# Patient Record
Sex: Female | Born: 1955 | Race: White | Hispanic: No | Marital: Married | Smoking: Former smoker
Health system: Southern US, Community
[De-identification: ages and names within clinical notes are randomized; demographics above are authoritative.]

## PROBLEM LIST (undated history)

## (undated) DIAGNOSIS — F329 Major depressive disorder, single episode, unspecified: Secondary | ICD-10-CM

## (undated) DIAGNOSIS — R011 Cardiac murmur, unspecified: Secondary | ICD-10-CM

## (undated) DIAGNOSIS — E559 Vitamin D deficiency, unspecified: Secondary | ICD-10-CM

## (undated) DIAGNOSIS — M2550 Pain in unspecified joint: Secondary | ICD-10-CM

## (undated) DIAGNOSIS — E079 Disorder of thyroid, unspecified: Secondary | ICD-10-CM

## (undated) DIAGNOSIS — K760 Fatty (change of) liver, not elsewhere classified: Secondary | ICD-10-CM

## (undated) DIAGNOSIS — Z780 Asymptomatic menopausal state: Secondary | ICD-10-CM

## (undated) DIAGNOSIS — K5900 Constipation, unspecified: Secondary | ICD-10-CM

## (undated) DIAGNOSIS — R12 Heartburn: Secondary | ICD-10-CM

## (undated) DIAGNOSIS — R7303 Prediabetes: Secondary | ICD-10-CM

## (undated) DIAGNOSIS — H409 Unspecified glaucoma: Secondary | ICD-10-CM

## (undated) DIAGNOSIS — F32A Depression, unspecified: Secondary | ICD-10-CM

## (undated) HISTORY — DX: Asymptomatic menopausal state: Z78.0

## (undated) HISTORY — DX: Unspecified glaucoma: H40.9

## (undated) HISTORY — DX: Cardiac murmur, unspecified: R01.1

## (undated) HISTORY — PX: TUBAL LIGATION: SHX77

## (undated) HISTORY — PX: OTHER SURGICAL HISTORY: SHX169

## (undated) HISTORY — DX: Major depressive disorder, single episode, unspecified: F32.9

## (undated) HISTORY — DX: Heartburn: R12

## (undated) HISTORY — DX: Disorder of thyroid, unspecified: E07.9

## (undated) HISTORY — DX: Pain in unspecified joint: M25.50

## (undated) HISTORY — DX: Depression, unspecified: F32.A

## (undated) HISTORY — DX: Vitamin D deficiency, unspecified: E55.9

## (undated) HISTORY — DX: Prediabetes: R73.03

## (undated) HISTORY — PX: ORIF ELBOW FRACTURE: SUR928

## (undated) HISTORY — PX: TONSILLECTOMY: SUR1361

## (undated) HISTORY — DX: Constipation, unspecified: K59.00

## (undated) HISTORY — PX: TOE SURGERY: SHX1073

## (undated) HISTORY — DX: Fatty (change of) liver, not elsewhere classified: K76.0

---

## 1998-07-04 ENCOUNTER — Other Ambulatory Visit: Admission: RE | Admit: 1998-07-04 | Discharge: 1998-07-04 | Payer: Self-pay | Admitting: Obstetrics and Gynecology

## 1999-06-08 ENCOUNTER — Ambulatory Visit (HOSPITAL_COMMUNITY): Admission: RE | Admit: 1999-06-08 | Discharge: 1999-06-08 | Payer: Self-pay | Admitting: Family Medicine

## 1999-06-08 ENCOUNTER — Encounter: Payer: Self-pay | Admitting: Family Medicine

## 1999-07-04 ENCOUNTER — Encounter: Payer: Self-pay | Admitting: Family Medicine

## 1999-07-04 ENCOUNTER — Ambulatory Visit (HOSPITAL_COMMUNITY): Admission: RE | Admit: 1999-07-04 | Discharge: 1999-07-04 | Payer: Self-pay | Admitting: Family Medicine

## 1999-12-25 ENCOUNTER — Other Ambulatory Visit: Admission: RE | Admit: 1999-12-25 | Discharge: 1999-12-25 | Payer: Self-pay | Admitting: Obstetrics and Gynecology

## 2001-01-30 ENCOUNTER — Other Ambulatory Visit: Admission: RE | Admit: 2001-01-30 | Discharge: 2001-01-30 | Payer: Self-pay | Admitting: Obstetrics and Gynecology

## 2001-04-13 ENCOUNTER — Encounter: Payer: Self-pay | Admitting: Endocrinology

## 2001-04-13 ENCOUNTER — Encounter: Admission: RE | Admit: 2001-04-13 | Discharge: 2001-04-13 | Payer: Self-pay | Admitting: Endocrinology

## 2002-03-23 ENCOUNTER — Other Ambulatory Visit: Admission: RE | Admit: 2002-03-23 | Discharge: 2002-03-23 | Payer: Self-pay | Admitting: Obstetrics and Gynecology

## 2002-04-02 ENCOUNTER — Encounter: Payer: Self-pay | Admitting: Endocrinology

## 2002-04-02 ENCOUNTER — Encounter: Admission: RE | Admit: 2002-04-02 | Discharge: 2002-04-02 | Payer: Self-pay | Admitting: Endocrinology

## 2003-04-21 ENCOUNTER — Other Ambulatory Visit: Admission: RE | Admit: 2003-04-21 | Discharge: 2003-04-21 | Payer: Self-pay | Admitting: Obstetrics and Gynecology

## 2004-07-19 ENCOUNTER — Other Ambulatory Visit: Admission: RE | Admit: 2004-07-19 | Discharge: 2004-07-19 | Payer: Self-pay | Admitting: Obstetrics and Gynecology

## 2004-08-01 ENCOUNTER — Encounter: Admission: RE | Admit: 2004-08-01 | Discharge: 2004-08-01 | Payer: Self-pay | Admitting: Obstetrics and Gynecology

## 2005-08-28 ENCOUNTER — Other Ambulatory Visit: Admission: RE | Admit: 2005-08-28 | Discharge: 2005-08-28 | Payer: Self-pay | Admitting: Obstetrics and Gynecology

## 2005-09-19 ENCOUNTER — Encounter: Admission: RE | Admit: 2005-09-19 | Discharge: 2005-09-19 | Payer: Self-pay | Admitting: Obstetrics and Gynecology

## 2005-11-05 ENCOUNTER — Emergency Department (HOSPITAL_COMMUNITY): Admission: EM | Admit: 2005-11-05 | Discharge: 2005-11-05 | Payer: Self-pay | Admitting: Emergency Medicine

## 2005-11-08 ENCOUNTER — Encounter (HOSPITAL_COMMUNITY): Admission: RE | Admit: 2005-11-08 | Discharge: 2006-01-23 | Payer: Self-pay | Admitting: Emergency Medicine

## 2007-02-26 ENCOUNTER — Encounter: Admission: RE | Admit: 2007-02-26 | Discharge: 2007-02-26 | Payer: Self-pay | Admitting: Endocrinology

## 2007-11-27 ENCOUNTER — Encounter: Admission: RE | Admit: 2007-11-27 | Discharge: 2007-11-27 | Payer: Self-pay | Admitting: Obstetrics and Gynecology

## 2008-02-26 ENCOUNTER — Encounter: Admission: RE | Admit: 2008-02-26 | Discharge: 2008-02-26 | Payer: Self-pay | Admitting: Endocrinology

## 2009-01-02 ENCOUNTER — Encounter: Admission: RE | Admit: 2009-01-02 | Discharge: 2009-01-02 | Payer: Self-pay | Admitting: Obstetrics and Gynecology

## 2009-02-28 ENCOUNTER — Other Ambulatory Visit: Admission: RE | Admit: 2009-02-28 | Discharge: 2009-02-28 | Payer: Self-pay | Admitting: Family Medicine

## 2009-10-20 ENCOUNTER — Ambulatory Visit: Payer: Self-pay | Admitting: Family Medicine

## 2009-10-20 DIAGNOSIS — E042 Nontoxic multinodular goiter: Secondary | ICD-10-CM | POA: Insufficient documentation

## 2009-10-20 DIAGNOSIS — R928 Other abnormal and inconclusive findings on diagnostic imaging of breast: Secondary | ICD-10-CM | POA: Insufficient documentation

## 2009-10-20 DIAGNOSIS — E038 Other specified hypothyroidism: Secondary | ICD-10-CM

## 2009-10-20 DIAGNOSIS — E039 Hypothyroidism, unspecified: Secondary | ICD-10-CM

## 2009-10-20 DIAGNOSIS — K3189 Other diseases of stomach and duodenum: Secondary | ICD-10-CM | POA: Insufficient documentation

## 2009-10-20 DIAGNOSIS — R1013 Epigastric pain: Secondary | ICD-10-CM

## 2009-10-20 DIAGNOSIS — N943 Premenstrual tension syndrome: Secondary | ICD-10-CM | POA: Insufficient documentation

## 2009-10-20 HISTORY — DX: Other specified hypothyroidism: E03.8

## 2009-10-20 HISTORY — DX: Nontoxic multinodular goiter: E04.2

## 2009-10-24 ENCOUNTER — Encounter (INDEPENDENT_AMBULATORY_CARE_PROVIDER_SITE_OTHER): Payer: Self-pay | Admitting: *Deleted

## 2009-11-23 ENCOUNTER — Encounter (INDEPENDENT_AMBULATORY_CARE_PROVIDER_SITE_OTHER): Payer: Self-pay | Admitting: *Deleted

## 2009-11-27 ENCOUNTER — Ambulatory Visit: Payer: Self-pay | Admitting: Internal Medicine

## 2009-11-30 ENCOUNTER — Telehealth (INDEPENDENT_AMBULATORY_CARE_PROVIDER_SITE_OTHER): Payer: Self-pay | Admitting: *Deleted

## 2010-01-03 ENCOUNTER — Encounter: Admission: RE | Admit: 2010-01-03 | Discharge: 2010-01-03 | Payer: Self-pay | Admitting: Family Medicine

## 2010-01-04 ENCOUNTER — Encounter (INDEPENDENT_AMBULATORY_CARE_PROVIDER_SITE_OTHER): Payer: Self-pay | Admitting: *Deleted

## 2010-03-02 ENCOUNTER — Encounter (INDEPENDENT_AMBULATORY_CARE_PROVIDER_SITE_OTHER): Payer: Self-pay | Admitting: *Deleted

## 2010-03-02 ENCOUNTER — Other Ambulatory Visit: Admission: RE | Admit: 2010-03-02 | Discharge: 2010-03-02 | Payer: Self-pay | Admitting: Family Medicine

## 2010-03-02 ENCOUNTER — Encounter: Payer: Self-pay | Admitting: Family Medicine

## 2010-03-02 ENCOUNTER — Ambulatory Visit: Payer: Self-pay | Admitting: Family Medicine

## 2010-03-02 DIAGNOSIS — Z780 Asymptomatic menopausal state: Secondary | ICD-10-CM | POA: Insufficient documentation

## 2010-03-02 DIAGNOSIS — N951 Menopausal and female climacteric states: Secondary | ICD-10-CM | POA: Insufficient documentation

## 2010-03-02 HISTORY — DX: Menopausal and female climacteric states: N95.1

## 2010-03-07 ENCOUNTER — Encounter: Admission: RE | Admit: 2010-03-07 | Discharge: 2010-03-07 | Payer: Self-pay | Admitting: Family Medicine

## 2010-03-08 ENCOUNTER — Telehealth (INDEPENDENT_AMBULATORY_CARE_PROVIDER_SITE_OTHER): Payer: Self-pay | Admitting: *Deleted

## 2010-03-27 ENCOUNTER — Encounter (INDEPENDENT_AMBULATORY_CARE_PROVIDER_SITE_OTHER): Payer: Self-pay | Admitting: *Deleted

## 2010-04-10 ENCOUNTER — Telehealth (INDEPENDENT_AMBULATORY_CARE_PROVIDER_SITE_OTHER): Payer: Self-pay | Admitting: *Deleted

## 2010-04-25 ENCOUNTER — Encounter (INDEPENDENT_AMBULATORY_CARE_PROVIDER_SITE_OTHER): Payer: Self-pay | Admitting: *Deleted

## 2010-04-27 ENCOUNTER — Ambulatory Visit: Payer: Self-pay | Admitting: Gastroenterology

## 2010-04-27 ENCOUNTER — Telehealth: Payer: Self-pay | Admitting: Gastroenterology

## 2010-05-30 ENCOUNTER — Ambulatory Visit
Admission: RE | Admit: 2010-05-30 | Discharge: 2010-05-30 | Payer: Self-pay | Source: Home / Self Care | Attending: Gastroenterology | Admitting: Gastroenterology

## 2010-05-30 ENCOUNTER — Encounter: Payer: Self-pay | Admitting: Gastroenterology

## 2010-05-30 HISTORY — PX: COLONOSCOPY: SHX174

## 2010-05-30 LAB — HM COLONOSCOPY

## 2010-06-10 LAB — CONVERTED CEMR LAB
ALT: 19 units/L (ref 0–35)
AST: 19 units/L (ref 0–37)
Basophils Absolute: 0 10*3/uL (ref 0.0–0.1)
Basophils Relative: 0.7 % (ref 0.0–3.0)
Calcium: 9.3 mg/dL (ref 8.4–10.5)
Cholesterol: 225 mg/dL — ABNORMAL HIGH (ref 0–200)
Creatinine, Ser: 0.8 mg/dL (ref 0.4–1.2)
Eosinophils Absolute: 0.1 10*3/uL (ref 0.0–0.7)
Eosinophils Relative: 2.5 % (ref 0.0–5.0)
GFR calc non Af Amer: 78.45 mL/min (ref >60)
HCT: 41.9 % (ref 36.0–46.0)
HDL: 60.6 mg/dL (ref >39.00)
Hemoglobin: 14.6 g/dL (ref 12.0–15.0)
LH: 18.76 milliintl units/mL
Lymphs Abs: 1.7 10*3/uL (ref 0.7–4.0)
MCHC: 34.8 g/dL (ref 30.0–36.0)
MCV: 91.3 fL (ref 78.0–100.0)
Monocytes Absolute: 0.4 10*3/uL (ref 0.1–1.0)
Neutrophils Relative %: 49.9 % (ref 43.0–77.0)
RBC: 4.59 M/uL (ref 3.87–5.11)
TSH: 1.63 microintl units/mL (ref 0.35–5.50)
Total CHOL/HDL Ratio: 4
Total Protein: 6.7 g/dL (ref 6.0–8.3)
VLDL: 13.8 mg/dL (ref 0.0–40.0)

## 2010-06-12 NOTE — Assessment & Plan Note (Signed)
Summary: cpx/pap/lab/cbs   Vital Signs:  Patient profile:   55 year old female Height:      70.5 inches Weight:      195.4 pounds Temp:     98.7 degrees F oral Pulse rate:   60 / minute Pulse rhythm:   regular BP sitting:   126 / 70  (left arm) Cuff size:   regular  Vitals Entered By: Almeta Monas CMA Duncan Dull) (March 02, 2010 9:20 AM) CC: cpx/pap   History of Present Illness: Pt here for cpe, pap and labs.  No complaints.   Pt will get a flu shot at work.     Preventive Screening-Counseling & Management  Alcohol-Tobacco     Alcohol drinks/day: <1     Alcohol type: all     Smoking Status: quit > 6 months     Packs/Day: 1.0     Year Started: 1976     Year Quit: 1995  Caffeine-Diet-Exercise     Caffeine use/day: 2     Does Patient Exercise: yes     Type of exercise: total gym     Exercise (avg: min/session): 30-60     Times/week: 2     Exercise Counseling: to improve exercise regimen  Hep-HIV-STD-Contraception     Dental Visit-last 6 months yes     Dental Care Counseling: to seek dental care; no dental care within six months     SBE monthly: yes     SBE Education/Counseling: not indicated; SBE done regularly      Sexual History:  currently monogamous.    Current Medications (verified): 1)  Synthroid 112 Mcg Tabs (Levothyroxine Sodium) .[name].. 1 By Mouth Once Daily 2)  Caltrate 600 1500 Mg Tabs (Calcium Carbonate) 3)  Black Cohosh Root 540 Mg Caps (Black Cohosh) .... By Mouth Once Daily 4)  Sarafem 20 Mg Tabs (Fluoxetine Hcl (Pmdd)) .[name].. 1 By Mouth Once Daily  Allergies (verified): 1)  ! Morphine 2)  ! Versed  Past History:  Past Medical History: Last updated: 10/20/2009 Hypothyroidism Breast calcifications --right breast  Past Surgical History: Last updated: 10/20/2009 ORAF left elbow Tubal ligation Tonsillectomy  Family History: Last updated: 10/20/2009 Family History of Arthritis Family History Hypertension Parkinsons Disease Family History of  Stroke M 1st degree relative 11 yo-- brother  Social History: Last updated: 10/20/2009 Married Never Smoked Alcohol use-yes Drug use-no Regular exercise-no Occupation: [employer]--[employer]  Risk Factors: Alcohol Use: <1 (03/02/2010) Caffeine Use: 2 (03/02/2010) Exercise: yes (03/02/2010)  Risk Factors: Smoking Status: quit > 6 months (03/02/2010) Packs/Day: 1.0 (03/02/2010)  Family History: Reviewed history from 10/20/2009 and no changes required. Family History of Arthritis Family History Hypertension Parkinsons Disease Family History of Stroke M 1st degree relative 38 yo-- brother  Social History: Reviewed history from 10/20/2009 and no changes required. 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 Eyes:  Denies blurring, discharge, double vision, eye irritation, eye pain, halos, itching, light sensitivity, red eye, vision loss-1 eye, and vision loss-both eyes; [id number]. ENT:  Denies decreased hearing, difficulty swallowing, ear discharge, earache, hoarseness, nasal congestion, nosebleeds, postnasal drainage, ringing in ears, sinus pressure, and sore throat.  CV:  Denies bluish discoloration of lips or nails, chest pain or discomfort, difficulty breathing at night, difficulty breathing while lying down, fainting, fatigue, leg cramps with exertion, lightheadness, near fainting, palpitations, shortness of breath with exertion, swelling of feet, swelling of hands, and weight gain. Resp:  Denies chest discomfort, chest pain with inspiration, cough, coughing up blood, excessive snoring, hypersomnolence, morning  headaches, pleuritic, shortness of breath, sputum productive, and wheezing. GI:  Denies abdominal pain, bloody stools, change in bowel habits, constipation, dark tarry stools, diarrhea, excessive appetite, gas, hemorrhoids, indigestion, and loss of appetite. GU:  Denies abnormal vaginal bleeding, decreased libido, discharge, dysuria, genital sores, hematuria, incontinence, nocturia, urinary frequency, and urinary hesitancy. MS:  Denies joint pain, joint redness, joint swelling, loss of strength, low back pain, mid back pain, muscle aches, muscle , cramps, muscle weakness, stiffness, and thoracic pain. Derm:  Denies changes in color of skin, changes in nail beds, dryness, excessive perspiration, flushing, hair loss, insect bite(s), itching, lesion(s), poor wound healing, and rash. Neuro:  Denies brief paralysis, difficulty with concentration, disturbances in coordination, falling down, headaches, inability to speak, memory loss, numbness, poor balance, seizures, sensation of room spinning, tingling, tremors, visual disturbances, and weakness. Psych:  Denies alternate hallucination ( auditory/visual), anxiety, depression, easily angered, easily tearful, irritability, mental problems, panic attacks, sense of great danger, suicidal thoughts/plans, thoughts of violence, unusual visions or sounds, and thoughts /plans of harming others. Endo:  Denies cold intolerance, excessive hunger, excessive thirst, excessive urination, heat intolerance, polyuria, and weight change. Heme:  Denies abnormal bruising, bleeding, enlarge lymph nodes, fevers, pallor, and skin discoloration. Allergy:  Denies hives or rash, itching eyes, persistent infections, seasonal allergies, and sneezing.  Physical Exam  General:  Well-developed,well-nourished,in no acute distress; alert,appropriate and cooperative throughout examination Head:  Normocephalic and atraumatic without obvious abnormalities. No apparent alopecia or  balding. Eyes:  vision grossly intact, pupils equal, pupils round, pupils reactive to light, and no injection.   Ears:  External ear exam shows no significant lesions or deformities.  Otoscopic examination reveals clear canals, tympanic membranes are intact bilaterally without bulging, retraction, inflammation or discharge. Hearing is grossly normal bilaterally. Nose:  External nasal examination shows no deformity or inflammation. Nasal mucosa are pink and moist without lesions or exudates. Mouth:  Oral mucosa and oropharynx without lesions or exudates.  Teeth in good repair. Neck:  No deformities, masses, or tenderness noted. Chest Wall:  No deformities, masses, or tenderness noted. Breasts:  No mass, nodules, thickening, tenderness, bulging, retraction, inflamation, nipple discharge or skin changes noted.   Lungs:  Normal respiratory effort, chest expands symmetrically. Lungs are clear to auscultation, no crackles or wheezes. Heart:  normal rate and no murmur.   Abdomen:  Bowel sounds positive,abdomen soft and non-tender without masses, organomegaly or hernias noted. Rectal:  No external abnormalities noted. Normal sphincter tone. No rectal masses or tenderness. Genitalia:  Pelvic Exam:        External: normal female genitalia without lesions or masses        Vagina: normal without lesions or masses        Cervix: normal without lesions or masses        Adnexa: normal bimanual exam without masses or fullness        Uterus: normal by palpation        Pap smear: performed Msk:  normal ROM, no joint tenderness, no joint swelling, no joint warmth, no redness over joints, no joint deformities, no joint instability, and no crepitation.  Pulses:  R posterior tibial normal, R dorsalis pedis normal, R carotid normal, L posterior tibial normal, L dorsalis pedis normal, and L carotid normal.   Extremities:  No clubbing, cyanosis, edema, or deformity noted with normal full range of motion of all joints.    Neurologic:  No cranial nerve deficits noted. Station and gait are normal. Plantar reflexes are down-going bilaterally. DTRs are symmetrical throughout. Sensory, motor and coordinative functions appear intact. Skin:  Intact without suspicious lesions or rashes Cervical Nodes:  No lymphadenopathy noted Axillary Nodes:  No palpable lymphadenopathy Psych:  Cognition and judgment appear intact. Alert and cooperative with normal attention span and concentration. No apparent delusions, illusions, hallucinations   Impression & Recommendations:  Problem # 1:  PREVENTIVE HEALTH CARE (ICD-V70.0)  Orders: Gastroenterology Referral (GI) Venipuncture ([address]) TLB-Lipid Panel (80061-LIPID) TLB-BMP (Basic Metabolic Panel-BMET) (80048-METABOL) TLB-CBC Platelet - w/Differential (85025-CBCD) TLB-Hepatic/Liver Function Pnl (80076-HEPATIC) TLB-TSH (Thyroid Stimulating Hormone) (84443-TSH) T-Vitamin D (25-Hydroxy) (01751-02585) TLB-Luteinizing Hormone (LH) (83002-LH) T- * Misc. Laboratory test [phone]) Specimen Handling (42353) EKG w/ Interpretation (93000)  Problem # 2:  POSTMENOPAUSAL STATUS (ICD-V49.81)  Orders: T-Vitamin D (25-Hydroxy) (61443-15400) TLB-Luteinizing Hormone (LH) (83002-LH) T- * Misc. Laboratory test [phone]) TLB-FSH (Follicle Stimulating Hormone) (83001-FSH) Specimen Handling (95093) EKG w/ Interpretation (93000)  Problem # 3:  HYPOTHYROIDISM (ICD-244.9)  Her updated medication list for this problem includes:    Synthroid 112 Mcg Tabs (Levothyroxine sodium) .[name]... 1 by mouth once daily  Orders: Venipuncture (26712) TLB-Lipid Panel (80061-LIPID) TLB-BMP (Basic Metabolic Panel-BMET) (80048-METABOL) TLB-CBC Platelet - w/Differential (85025-CBCD) TLB-Hepatic/Liver Function Pnl (80076-HEPATIC) TLB-TSH (Thyroid Stimulating Hormone) (84443-TSH) T-Vitamin D (25-Hydroxy) (45809-98338) TLB-Luteinizing Hormone (LH) (83002-LH) T- * Misc. Laboratory test [phone]) EKG w/  Interpretation (93000)  Problem # 4:  NONTOXIC MULTINODULAR GOITER (ICD-241.1)  Orders: Radiology Referral (Radiology)  Problem # 5:  HOT FLASHES (ICD-627.2)  effexor 37.5  once daily for 1 week then 75 mg daily  Discussed treatment options.   Orders: EKG w/ Interpretation (93000)  Complete Medication List: 1)  Synthroid 112 Mcg Tabs (Levothyroxine sodium) .[name].. 1 by mouth once daily 2)  Caltrate 600 1500 Mg Tabs (Calcium carbonate) 3)  Black Cohosh Root 540 Mg Caps (Black cohosh) .... By mouth once daily 4)  Effexor Xr 37.5 Mg Xr24h-cap (Venlafaxine hcl) .[name].. 1 by mouth once daily for 1 week then 2 by mouth once daily 5)  Effexor Xr 75 Mg Xr24h-cap (Venlafaxine hcl) .[name].. 1 by mouth once daily Prescriptions: EFFEXOR XR 75 MG XR24H-CAP (VENLAFAXINE HCL) 1 by mouth once daily  #30 x 2   Entered and Authorized by:   Loreen Freud DO   Signed by:   Loreen Freud DO on 03/02/2010   Method used:   Historical   RxID:   [id number] EFFEXOR XR 37.5 MG XR24H-CAP (VENLAFAXINE HCL) 1 by mouth once daily for 1 week then 2 by mouth once daily  #60 x 0   Entered and Authorized by:   Loreen Freud DO   Signed by:   Loreen Freud DO on 03/02/2010   Method used:   Electron[facility] to        [facility]  [address] [phone][facility] (retail)       [address]       [place], [place]  [id number]       Ph: [phone] or [phone]       Fax: [phone]   RxID:   [id number]    Orders Added: 1)  Gastroenterology Referral [GI] 2)  Venipuncture [35573] 3)  TLB-Lipid Panel [80061-LIPID] 4)  TLB-BMP (Basic Metabolic Panel-BMET) [80048-METABOL] 5)  TLB-CBC Platelet - w/Differential [85025-CBCD] 6)  TLB-Hepatic/Liver Function Pnl [80076-HEPATIC] 7)  TLB-TSH (Thyroid Stimulating Hormone) [84443-TSH] 8)  T-Vitamin D (25-Hydroxy) [22025-42706] 9)  TLB-Luteinizing Hormone (LH) [83002-LH] 10)  T- * Misc. Laboratory test [99999] 11)  TLB-FSH (Follicle Stimulating Hormone)  [83001-FSH] 12)  Specimen Handling [99000] 13)  Radiology Referral [Radiology] 14)  Est. Patient 40-64 years [99396] 15)  EKG w/ Interpretation [93000]    Last Mammogram:  BI-RADS CATEGORY 2:  Benign finding(s).^MM DIGITAL DIAGNOSTIC BILAT (01/03/2010 10:11:00 AM) Mammogram Result Date:  01/03/2010 Mammogram Result:  normal Mammogram Next Due:  1 yr      Appended Document: cpx/pap/lab/cbs    Phone Note Call from Patient   Caller: Patient Summary of Call: PLEASE RESEND RX'S TO [address] Initial call taken by: Lavell Islam,  March 13, 2010 3:56 PM    Prescriptions: EFFEXOR XR 37.5 MG XR24H-CAP (VENLAFAXINE HCL) 1 by mouth once daily for 1 week then 2 by mouth once daily  #60 x 0   Entered by:   Almeta Monas CMA (AAMA)   Authorized by:   Loreen Freud DO   Signed by:   Almeta Monas CMA (AAMA) on 03/13/2010   Method used:   Electronically to        [facility] [address] [phone]* (retail)       [address]       [address]       [place], [place]  [address]       Ph: [phone]       Fax: [phone]   RxID:   [id number] EFFEXOR XR 75 MG XR24H-CAP (VENLAFAXINE HCL) 1 by mouth once daily  #30 x 2   Entered by:   Almeta Monas CMA (AAMA)   Authorized by:   Loreen Freud DO   Signed by:   Almeta Monas CMA (AAMA) on 03/13/2010   Method used:   Electronically to        [facility] [address] [phone]* (retail)       [address]       [address]       [place], [place]  [address]       Ph: [phone]       Fax: [phone]   RxID:   [id number]

## 2010-06-12 NOTE — Letter (Signed)
Summary: Results Follow up Letter  [facility]  [address] [place], [place] [address]   Phone: [phone]  Fax: [phone]    01/04/2010 MRN: [id number]  Michelle Pratt [address] [place], [place]  [address]  Dear [name],  The following are the results of your recent test(s):  Test         Result    Pap Smear:        Normal _____  Not Normal _____ Comments: ______________________________________________________ Cholesterol: LDL(Bad cholesterol):         Your goal is less than:         HDL (Good cholesterol):       Your goal is more than: Comments:  ______________________________________________________ Mammogram:        Normal __X__  Not Normal _____ Comments:  ___________________________________________________________________ Hemoccult:        Normal _____  Not normal _______ Comments:    _____________________________________________________________________ Other Tests:    We routinely do not discuss normal results over the telephone.  If you desire a copy of the results, or you have any questions about this information we can discuss them at your next office visit.   Sincerely,

## 2010-06-12 NOTE — Letter (Signed)
Summary: Previsit letter  [facility]  [address] [place], [place] [address]   Phone: [phone]  Fax: [phone]       10/24/2009 MRN: [id number]  Michelle Pratt [address] [place], [place]  [address]  Dear Michelle Pratt,  Welcome to the Gastroenterology Division at [facility].    You are scheduled to see a nurse for your pre-procedure visit on 11/27/2009 at 9:30AM on the 3rd floor at [facility], [address]  We ask that you try to arrive at our office 15 minutes prior to your appointment time to allow for check-in.  Your nurse visit will consist of discussing your medical and surgical history, your immediate family medical history, and your medications.    Please bring a complete list of all your medications or, if you prefer, bring the medication bottles and we will list them.  We will need to be aware of both prescribed and over the counter drugs.  We will need to know exact dosage information as well.  If you are on blood thinners (Coumadin, Plavix, Aggrenox, Ticlid, etc.) please call our office today/prior to your appointment, as we need to consult with your physician about holding your medication.   Please be prepared to read and sign documents such as consent forms, a financial agreement, and acknowledgement forms.  If necessary, and with your consent, a friend or relative is welcome to sit-in on the nurse visit with you.  Please bring your insurance card so that we may make a copy of it.  If your insurance requires a referral to see a specialist, please bring your referral form from your primary care physician.  No co-pay is required for this nurse visit.     If you cannot keep your appointment, please call [phone] to cancel or reschedule prior to your appointment date.  This allows [place] the opportunity to schedule an appointment for another patient in need of care.    Thank you for choosing [facility] for your medical  needs.  We appreciate the opportunity to care for you.  Please visit [place] at our website  to learn more about our practice.                     Sincerely.                                                                                                                   The Gastroenterology Division

## 2010-06-12 NOTE — Assessment & Plan Note (Signed)
Summary: new to estab/cbs   Vital Signs:  Patient profile:   55 year old female Height:      71 inches Weight:      192 pounds BMI:     26.88 Pulse rate:   81 / minute Pulse rhythm:   regular BP sitting:   120 / 80  (left arm) Cuff size:   regular  Vitals Entered By: Army Fossa CMA (October 20, 2009 10:04 AM) CC: Pt here to establish, discuss thyroid, menopause   History of Present Illness: Pt here to establish.  Pt with hx hypothyroidism.  Pt last period--in September-- she was on sarafem for pms Pt was seeing Dr Juleen China and Dr Henderson Cloud and had seen Dr Wynelle Link but Deatra James everything done here.  Pt just had cpe in october and recently had labs with Dr Wynelle Link.   Preventive Screening-Counseling & Management  Alcohol-Tobacco     Smoking Status: never  Caffeine-Diet-Exercise     Does Patient Exercise: no      Drug Use:  no.    Current Medications (verified): 1)  Synthroid 112 Mcg Tabs (Levothyroxine Sodium) .[name].. 1 By Mouth Once Daily 2)  Caltrate 600 1500 Mg Tabs (Calcium Carbonate) 3)  Estroven  Tabs (Nutritional Supplements) 4)  Sarafem 20 Mg Tabs (Fluoxetine Hcl (Pmdd)) .[name].. 1 By Mouth Once Daily 5)  Prilosec Otc 20 Mg Tbec (Omeprazole Magnesium) .[name].. 1 By Mouth Once Daily  Allergies (verified): 1)  ! 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 Family History of  Arthritis Family History Hypertension Parkinsons Disease Family History of Stroke M 1st degree relative 96 yo-- brother  Social History: Reviewed history and no changes required. 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  Teeth in good repair. Neck:  No deformities, masses, or tenderness noted. Lungs:  Normal respiratory effort, chest expands symmetrically. Lungs are clear to auscultation, no crackles or wheezes. Heart:  normal rate and no murmur.   Abdomen:  soft, no distention, no masses, no guarding, no rigidity, no rebound tenderness, and epigastric tenderness.   Extremities:  No clubbing, cyanosis, edema, or deformity noted with normal full range of motion of all joints.   Neurologic:  alert & oriented X3, strength normal in all extremities, and gait normal.   Skin:  Intact without suspicious lesions or rashes Cervical Nodes:  No lymphadenopathy noted Psych:  Oriented X3 and normally interactive.     Impression & Recommendations:  Problem # 1:  NONTOXIC MULTINODULAR GOITER (ICD-241.1) need records from previous physician She may need [place]  Problem # 2:  OTHER ABNORMAL FINDING RADIOLOGICAL EXAM BREAST (ICD-793.89) Pt due for f/u mammo now she will call  Problem # 3:  PMDD (ICD-625.4)  prozac 20mg  once daily---start with 1/2 tab once daily   Problem # 4:  HYPOTHYROIDISM (ICD-244.9) Will get labs from previous dr Her updated medication list for this problem includes:    Synthroid 112 Mcg Tabs (Levothyroxine sodium) .[name]... 1 by mouth once daily  Problem # 5:  DYSPEPSIA (ICD-536.8) prilosec otc--4-6  weeks if symptoms cont consider [place] abd / GI referral  Complete Medication List: 1)  Synthroid 112 Mcg Tabs (Levothyroxine sodium) .[name].. 1 by mouth once daily 2)  Caltrate 600 1500 Mg Tabs (Calcium carbonate) 3)  Estroven Tabs (Nutritional supplements) 4)  Sarafem 20 Mg Tabs (Fluoxetine hcl (pmdd)) .[name].. 1 by mouth once daily 5)  Prilosec Otc 20 Mg Tbec (Omeprazole magnesium) .[name].. 1 by mouth once daily  Other Orders: Gastroenterology Referral (GI) Prescriptions: SYNTHROID 112 MCG TABS (LEVOTHYROXINE SODIUM) 1 by mouth once daily Brand medically necessary #30 x 11   Entered and Authorized by:   Loreen Freud DO   Signed by:   Loreen Freud DO on 10/20/2009   Method used:   Reprint   RxID:   [id number] SYNTHROID 112 MCG TABS (LEVOTHYROXINE SODIUM) 1 by mouth once daily Brand medically necessary #30 x 11   Entered and Authorized by:   Loreen Freud DO   Signed by:   Loreen Freud DO on 10/20/2009   Method used:   Print then Give to Patient   RxID:   [id number] SARAFEM 20 MG TABS (FLUOXETINE HCL (PMDD)) 1 by mouth once daily  #30 x 11   Entered and Authorized by:   Loreen Freud DO   Signed by:   Loreen Freud DO on 10/20/2009   Method used:   [facility]        [facility]  [address] [phone][facility] (retail)       [address]       [place], [place]  [id number]       Ph: [phone] or [phone]       Fax: [phone]   RxID:   [id number]    Flu Vaccine Result Date:  02/22/2009 Flu Vaccine Result:  given Flu Vaccine Next Due:  1 yr TD Result Date:  10/26/2008 TD Result:  given TD Next Due:  10 yr Mammogram Result Date:  01/02/2009 Mammogram Result:  calcifications Rbreast Mammogram Next Due:  6 mo

## 2010-06-12 NOTE — Letter (Signed)
Summary: Pre Visit Letter Revised  [facility]  [address] [place], [place] [address]   Phone: [phone]  Fax: [phone]        03/27/2010 MRN: [id number] Michelle Pratt [address] [place], [place]  [address]             Procedure Date:  05-18-10   Welcome to the Gastroenterology Division at [facility].    You are scheduled to see a nurse for your pre-procedure visit on 04-27-10 at 10:30a.m. on the 3rd floor at [facility], [address]  We ask that you try to arrive at our office 15 minutes prior to your appointment time to allow for check-in.  Please take a minute to review the attached form.  If you answer "Yes" to one or more of the questions on the first page, we ask that you call the person listed at your earliest opportunity.  If you answer "No" to all of the questions, please complete the rest of the form and bring it to your appointment.    Your nurse visit will consist of discussing your medical and surgical history, your immediate family medical history, and your medications.   If you are unable to list all of your medications on the form, please bring the medication bottles to your appointment and we will list them.  We will need to be aware of both prescribed and over the counter drugs.  We will need to know exact dosage information as well.    Please be prepared to read and sign documents such as consent forms, a financial agreement, and acknowledgement forms.  If necessary, and with your consent, a friend or relative is welcome to sit-in on the nurse visit with you.  Please bring your insurance card so that we may make a copy of it.  If your insurance requires a referral to see a specialist, please bring your referral form from your primary care physician.  No co-pay is required for this nurse visit.     If you cannot keep your appointment, please call [phone] to cancel or reschedule prior to your appointment date.  This  allows [place] the opportunity to schedule an appointment for another patient in need of care.    Thank you for choosing [facility] for your medical needs.  We appreciate the opportunity to care for you.  Please visit [place] at our website  to learn more about our practice.  Sincerely, The Gastroenterology Division

## 2010-06-12 NOTE — Progress Notes (Signed)
Summary: Pt needs to sign Genetics form 10/27, 10/28  Phone Note Outgoing Call   Call placed by: Almeta Monas CMA Duncan Dull),  March 08, 2010 9:35 AM Call placed to: Patient Details for Reason: Pt needs to Sign Genetics form so we can mail it. Summary of Call: Left message to call back.... Almeta Monas CMA Duncan Dull)  March 08, 2010 9:36 AM  [email or web address] Barban  March 09, 2010 10:10 AM spk with pt and advised, said she will come in on Tuesday because it is her day off.....   Almeta Monas CMA Duncan Dull)  March 09, 2010 3:48 PM

## 2010-06-12 NOTE — Letter (Signed)
Summary: Cancer Screening/[facility] Personalized Risk Profile  Cancer Screening/Me [facility] Personalized Risk Profile   Imported By: Lanelle Bal 03/12/2010 12:49:10  _____________________________________________________________________  External Attachment:    Type:   Image     Comment:   External Document

## 2010-06-12 NOTE — Miscellaneous (Signed)
Summary: [facility] PV  Clinical Lists Changes  Medications: Added new medication of MOVIPREP 100 GM  SOLR (PEG-KCL-NACL-NASULF-NA ASC-C) As per prep instructions. - Signed Rx of MOVIPREP 100 GM  SOLR (PEG-KCL-NACL-NASULF-NA ASC-C) As per prep instructions.;  #1 x 0;  Signed;  Entered by: Ezra Sites RN;  Authorized by: Hilarie Fredrickson MD;  Method used: [facility] [address] [phone]*, [address], [place], [place], [place]  [id number], Ph: [phone] or [phone], Fax: [phone] Allergies: Added new allergy or adverse reaction of VERSED    Prescriptions: MOVIPREP 100 GM  SOLR (PEG-KCL-NACL-NASULF-NA ASC-C) As per prep instructions.  #1 x 0   Entered by:   Ezra Sites RN   Authorized by:   Hilarie Fredrickson MD   Signed by:   Ezra Sites RN on 11/27/2009   Method used:   [facility]  [address] [phone]* (retail)       [address]       [place], [place]  [id number]       Ph: [phone] or [phone]       Fax: [phone]   RxID:   [id number]

## 2010-06-12 NOTE — Progress Notes (Signed)
Summary: BrevaGen Results 11/29  Phone Note Outgoing Call   Call placed by: Almeta Monas CMA Duncan Dull),  April 10, 2010 11:01 AM Call placed to: Patient Details for Reason: BREVA GEN RESULTS Summary of Call: Pt wants to make sure she is doing monthly breast exam and annual mammogram. Increased Integrated 5 year risk but lifetime risk is low per Dr.Lowne..... Almeta Monas CMA (AAMA)  April 10, 2010 11:02 AM  PT AWARE.... Almeta Monas CMA Duncan Dull)  April 11, 2010 2:41 PM

## 2010-06-12 NOTE — Letter (Signed)
Summary: Moviprep Instructions  [facility]  [address]   [place], [place] [address]   Phone: [phone]  Fax: [phone]       Michelle Pratt    1956-05-08    MRN: [id number]        Procedure Day /Date: Thursday, 12-14-09     Arrival Time: 9:30 a.m.     Procedure Time: 10:30 a.m.     Location of Procedure:                    x   [facility] (4th Floor)   PREPARATION FOR COLONOSCOPY WITH MOVIPREP   Starting 5 days prior to your procedure  12-09-09 do not eat nuts, seeds, popcorn, corn, beans, peas,  salads, or any raw vegetables.  Do not take any fiber supplements (e.g. Metamucil, Citrucel, and Benefiber).  THE DAY BEFORE YOUR PROCEDURE         DATE: 12-13-09   DAY: Wednesday  1.  Drink clear liquids the entire day-NO SOLID FOOD  2.  Do not drink anything colored red or purple.  Avoid juices with pulp.  No orange juice.  3.  Drink at least 64 oz. (8 glasses) of fluid/clear liquids during the day to prevent dehydration and help the prep work efficiently.  CLEAR LIQUIDS INCLUDE: Water Jello Ice Popsicles Tea (sugar ok, no milk/cream) Powdered fruit flavored drinks Coffee (sugar ok, no milk/cream) Gatorade Juice: apple, white grape, white cranberry  Lemonade Clear bullion, consomm, broth Carbonated beverages (any kind) Strained chicken noodle soup Hard Candy                             4.  In the morning, mix first dose of MoviPrep solution:    Empty 1 Pouch A and 1 Pouch B into the disposable container    Add lukewarm drinking water to the top line of the container. Mix to dissolve    Refrigerate (mixed solution should be used within 24 hrs)  5.  Begin drinking the prep at 5:00 p.m. The MoviPrep container is divided by 4 marks.   Every 15 minutes drink the solution down to the next mark (approximately 8 oz) until the full liter is complete.   6.  Follow completed prep with 16 oz of clear liquid of your choice (Nothing red or  purple).  Continue to drink clear liquids until bedtime.  7.  Before going to bed, mix second dose of MoviPrep solution:    Empty 1 Pouch A and 1 Pouch B into the disposable container    Add lukewarm drinking water to the top line of the container. Mix to dissolve    Refrigerate  THE DAY OF YOUR PROCEDURE      DATE: 12-14-09  DAY: Thursday  Beginning at  5:30 a.m. (5 hours before procedure):         1. Every 15 minutes, drink the solution down to the next mark (approx 8 oz) until the full liter is complete.  2. Follow completed prep with 16 oz. of clear liquid of your choice.    3. You may drink clear liquids until  8:30 a.m.  (2 HOURS BEFORE PROCEDURE).   MEDICATION INSTRUCTIONS  Unless otherwise instructed, you should take regular prescription medications with a small sip of water   as early as possible the morning of your procedure.         OTHER INSTRUCTIONS  You will  need a responsible adult at least 54 years of age to accompany you and drive you home.   This person must remain in the waiting room during your procedure.  Wear loose fitting clothing that is easily removed.  Leave jewelry and other valuables at home.  However, you may wish to bring a book to read or  an iPod/MP3 player to listen to music as you wait for your procedure to start.  Remove all body piercing jewelry and leave at home.  Total time from sign-in until discharge is approximately 2-3 hours.  You should go home directly after your procedure and rest.  You can resume normal activities the  day after your procedure.  The day of your procedure you should not:   Drive   Make legal decisions   Operate machinery   Drink alcohol   Return to work  You will receive specific instructions about eating, activities and medications before you leave.    The above instructions have been reviewed and explained to me by   Ezra Sites RN  November 27, 2009 9:53 AM     I fully understand and can  verbalize these instructions _____________________________ Date _________

## 2010-06-12 NOTE — Letter (Signed)
Summary: Pre Visit Letter Revised  [facility]  [address] [place], [place] [address]   Phone: [phone]  Fax: [phone]        03/02/2010 MRN: [id number] Michelle Pratt [address] [place], [place]  [address]             Procedure Date: 04/10/2010   Welcome to the Gastroenterology Division at [facility].    You are scheduled to see a nurse for your pre-procedure visit on 03/26/2010 at 2:00pm on the 3rd floor at [facility], [address].  We ask that you try to arrive at our office 15 minutes prior to your appointment time to allow for check-in.  Please take a minute to review the attached form.  If you answer "Yes" to one or more of the questions on the first page, we ask that you call the person listed at your earliest opportunity.  If you answer "No" to all of the questions, please complete the rest of the form and bring it to your appointment.    Your nurse visit will consist of discussing your medical and surgical history, your immediate family medical history, and your medications.   If you are unable to list all of your medications on the form, please bring the medication bottles to your appointment and we will list them.  We will need to be aware of both prescribed and over the counter drugs.  We will need to know exact dosage information as well.    Please be prepared to read and sign documents such as consent forms, a financial agreement, and acknowledgement forms.  If necessary, and with your consent, a friend or relative is welcome to sit-in on the nurse visit with you.  Please bring your insurance card so that we may make a copy of it.  If your insurance requires a referral to see a specialist, please bring your referral form from your primary care physician.  No co-pay is required for this nurse visit.     If you cannot keep your appointment, please call [phone] to cancel or reschedule prior to your appointment date.  This  allows [place] the opportunity to schedule an appointment for another patient in need of care.    Thank you for choosing [facility] for your medical needs.  We appreciate the opportunity to care for you.  Please visit [place] at our website  to learn more about our practice.  Sincerely, The Gastroenterology Division

## 2010-06-12 NOTE — Progress Notes (Signed)
Summary: new referral  Phone Note Call from Patient Call back at Home Phone [phone]   Summary of Call: Pt left voicemail on the triage line stating that her insurance does not cover her seeing [facility] would like to be sent elsewhere. Army Fossa CMA  November 30, 2009 1:02 PM   Follow-up for Phone Call        Spoke with a representative @ [employer] who said Dr. Marina Goodell, GI was in network and contracted with her insurance.   LMTCB to inform her of this.Marland KitchenMarland KitchenHarold Barban  November 30, 2009 1:52 PM  lmtcb.Harold Barban  November 30, 2009 4:05 PM  lmtcb.Harold Barban  December 04, 2009 4:35 PM  Additional Follow-up for Phone Call Additional follow up Details #1::        Patient is aware and rsh her appt. Additional Follow-up by: Harold Barban,  December 11, 2009 4:47 PM

## 2010-06-14 NOTE — Miscellaneous (Signed)
Summary: previsit prep/rm  Clinical Lists Changes  Medications: Added new medication of MOVIPREP 100 GM  SOLR (PEG-KCL-NACL-NASULF-NA ASC-C) As per prep instructions. - Signed Rx of MOVIPREP 100 GM  SOLR (PEG-KCL-NACL-NASULF-NA ASC-C) As per prep instructions.;  #1 x 0;  Signed;  Entered by: Sherren Kerns RN;  Authorized by: Rachael Fee MD;  Method used: Electronically to [facility] [address] [phone]*, [address], [address], [place], [place]  [address], Ph: [phone], Fax: [phone] Observations: Added new observation of ALLERGY REV: Done (04/27/2010 10:19)    Prescriptions: MOVIPREP 100 GM  SOLR (PEG-KCL-NACL-NASULF-NA ASC-C) As per prep instructions.  #1 x 0   Entered by:   Sherren Kerns RN   Authorized by:   Rachael Fee MD   Signed by:   Sherren Kerns RN on 04/27/2010   Method used:   Electronically to        [facility] [address] [phone]* (retail)       [address]       [address]       [place], [place]  [address]       Ph: [phone]       Fax: [phone]   RxID:   [id number]

## 2010-06-14 NOTE — Letter (Signed)
Summary: [facility] Instructions  [facility]  [address] [place], [place] [address]   Phone: [phone]  Fax: [phone]       Michelle Pratt    12-23-1955    MRN: [id number]        Procedure Day Dorna Bloom:  [facility]  05/30/10     Arrival Time:  7:30AM      Procedure Time:  8:30AM     Location of Procedure:                    _ X_  [facility] (4th Floor)                      PREPARATION FOR COLONOSCOPY WITH MOVIPREP   Starting 5 days prior to your procedure 05/25/10 do not eat nuts, seeds, popcorn, corn, beans, peas,  salads, or any raw vegetables.  Do not take any fiber supplements (e.g. Metamucil, Citrucel, and Benefiber).  THE DAY BEFORE YOUR PROCEDURE         DATE: 05/29/10  DAY: TUESDAY  1.  Drink clear liquids the entire day-NO SOLID FOOD  2.  Do not drink anything colored red or purple.  Avoid juices with pulp.  No orange juice.  3.  Drink at least 64 oz. (8 glasses) of fluid/clear liquids during the day to prevent dehydration and help the prep work efficiently.  CLEAR LIQUIDS INCLUDE: Water Jello Ice Popsicles Tea (sugar ok, no milk/cream) Powdered fruit flavored drinks Coffee (sugar ok, no milk/cream) Gatorade Juice: apple, white grape, white cranberry  Lemonade Clear bullion, consomm, broth Carbonated beverages (any kind) Strained chicken noodle soup Hard Candy                             4.  In the morning, mix first dose of MoviPrep solution:    Empty 1 Pouch A and 1 Pouch B into the disposable container    Add lukewarm drinking water to the top line of the container. Mix to dissolve    Refrigerate (mixed solution should be used within 24 hrs)  5.  Begin drinking the prep at 5:00 p.m. The MoviPrep container is divided by 4 marks.   Every 15 minutes drink the solution down to the next mark (approximately 8 oz) until the full liter is complete.   6.  Follow completed prep with 16 oz of clear liquid of your choice  (Nothing red or purple).  Continue to drink clear liquids until bedtime.  7.  Before going to bed, mix second dose of MoviPrep solution:    Empty 1 Pouch A and 1 Pouch B into the disposable container    Add lukewarm drinking water to the top line of the container. Mix to dissolve    Refrigerate  THE DAY OF YOUR PROCEDURE      DATE: 05/30/10  DAY: WEDNESDAY  Beginning at 3:30AM (5 hours before procedure):         1. Every 15 minutes, drink the solution down to the next mark (approx 8 oz) until the full liter is complete.  2. Follow completed prep with 16 oz. of clear liquid of your choice.    3. You may drink clear liquids until 6:30AM (2 HOURS BEFORE PROCEDURE).   MEDICATION INSTRUCTIONS  Unless otherwise instructed, you should take regular prescription medications with a small sip of water   as early as possible the morning of  your procedure.           OTHER INSTRUCTIONS  You will need a responsible adult at least 54 years of age to accompany you and drive you home.   This person must remain in the waiting room during your procedure.  Wear loose fitting clothing that is easily removed.  Leave jewelry and other valuables at home.  However, you may wish to bring a book to read or  an iPod/MP3 player to listen to music as you wait for your procedure to start.  Remove all body piercing jewelry and leave at home.  Total time from sign-in until discharge is approximately 2-3 hours.  You should go home directly after your procedure and rest.  You can resume normal activities the  day after your procedure.  The day of your procedure you should not:   Drive   Make legal decisions   Operate machinery   Drink alcohol   Return to work  You will receive specific instructions about eating, activities and medications before you leave.    The above instructions have been reviewed and explained to me by   Sherren Kerns RN  April 27, 2010 10:49 AM    I fully  understand and can verbalize these instructions _____________________________ Date _________

## 2010-06-14 NOTE — Procedures (Signed)
Summary: Colonoscopy  Patient: Michelle Pratt Note: All result statuses are Final unless otherwise noted.  Tests: (1) Colonoscopy (COL)   COL Colonoscopy           DONE     [facility]     [address]     [place], [place]  [address]           COLONOSCOPY PROCEDURE REPORT           PATIENT:  Michelle, Pratt  MR#:  [id number]     BIRTHDATE:  [birth year], 54 yrs. old  GENDER:  female     ENDOSCOPIST:  Rachael Fee, MD     REF. BY:  Loreen Freud, DO     PROCEDURE DATE:  05/30/2010     PROCEDURE:  Diagnostic Colonoscopy     ASA CLASS:  Class II     INDICATIONS:  Routine Risk Screening     MEDICATIONS:  Fentanyl 75 mcg IV, Versed 6 mg IV           DESCRIPTION OF PROCEDURE:   After the risks benefits and     alternatives of the procedure were thoroughly explained, informed     consent was obtained.  Digital rectal exam was performed and     revealed no rectal masses.   The LB [id number]-[id number] endoscope     was introduced through the anus and advanced to the cecum, which     was identified by both the appendix and ileocecal valve, without     limitations.  The quality of the prep was adequate, using     MoviPrep.  The instrument was then slowly withdrawn as the colon     was fully examined.     <<PROCEDUREIMAGES>>     FINDINGS:  A normal appearing cecum, ileocecal valve, and     appendiceal orifice were identified. The ascending, hepatic     flexure, transverse, splenic flexure, descending, sigmoid colon,     and rectum appeared unremarkable (see image1, image2, and image3).     Retroflexed views in the rectum revealed no abnormalities.    The     scope was then withdrawn from the patient and the procedure     completed.     COMPLICATIONS:  None           ENDOSCOPIC IMPRESSION:     1) Normal colon     2) No polyps or cancers           RECOMMENDATIONS:     1) You should continue to follow colorectal cancer screening     guidelines for "routine risk" patients with  a repeat colonoscopy     in 10 years. There is no need for FOBT (stool) testing for at     least 5 years.           REPEAT EXAM:  10 years           ______________________________     Rachael Fee, MD           n.     eSIGNED:   Rachael Fee at 05/30/2010 09:09 AM           Jolene Provost, [id number]  Note: An exclamation mark (!) indicates a result that was not dispersed into the flowsheet. Document Creation Date: 05/30/2010 9:09 AM _______________________________________________________________________  (1) Order result status: Final Collection or observation date-time: 05/30/2010 09:05 Requested date-time:  Receipt date-time:  Reported date-time:  Referring Physician:  Ordering Physician: Rob Bunting [phone]) Specimen Source:  Source: Launa Grill Order Number: [id number] Lab site:   Appended Document: Colonoscopy    Clinical Lists Changes  Observations: Added new observation of COLONNXTDUE: 05/2020 (05/30/2010 13:58)

## 2010-06-14 NOTE — Progress Notes (Signed)
Summary: ? profolol or versed  Phone Note Call from Patient   Summary of Call: Patient in today for previsit for colonoscopy on 05-30-10. She is a Engineer, civil (consulting) from [employer], EMR says patient is allergic to VERSED but the patient said that after she had baby she was given this for her tubal ligation and BP dropped & resp. depression occured. She felt she was given too much or too fast after childbirth. She then stated that she had the versed years later for foot surgery and when she asked the MD he told her she did fine. She felt she would be fine to have versed & fent.  for the procedure when I asked her about the allergy. I did complete her previsit because she seemed Ok with having the procedure using the versed. Wanted to see if you wanted the patient to be done at [facility] with mod. sedation or profolol? thanks Initial call taken by: Sherren Kerns RN,  April 27, 2010 1:09 PM  Follow-up for Phone Call        fent and versed at [facility], thanks  Follow-up by: Rachael Fee MD,  April 27, 2010 1:49 PM

## 2010-10-13 ENCOUNTER — Inpatient Hospital Stay (INDEPENDENT_AMBULATORY_CARE_PROVIDER_SITE_OTHER)
Admission: RE | Admit: 2010-10-13 | Discharge: 2010-10-13 | Disposition: A | Discharge status: Home / Self Care | Payer: BC Managed Care – PPO | Source: Ambulatory Visit | Attending: Family Medicine | Admitting: Family Medicine

## 2010-10-13 DIAGNOSIS — T6391XA Toxic effect of contact with unspecified venomous animal, accidental (unintentional), initial encounter: Secondary | ICD-10-CM

## 2010-10-26 ENCOUNTER — Other Ambulatory Visit: Payer: Self-pay | Admitting: Family Medicine

## 2010-11-28 ENCOUNTER — Other Ambulatory Visit: Payer: Self-pay | Admitting: Family Medicine

## 2010-11-29 MED ORDER — LEVOTHYROXINE SODIUM 112 MCG PO TABS: 112.0000 ug | ORAL_TABLET | Freq: Every day | ORAL | Status: DC

## 2010-11-29 NOTE — Telephone Encounter (Signed)
Addended by: Doristine Devoid on: 11/29/2010 12:24 PM   Modules accepted: Orders

## 2010-11-29 NOTE — Telephone Encounter (Signed)
Refill sent with notation labs due now, no further refills. Also was noted on last refill.

## 2010-12-31 ENCOUNTER — Other Ambulatory Visit: Payer: Self-pay | Admitting: Family Medicine

## 2010-12-31 NOTE — Telephone Encounter (Signed)
Letter mailed     KP 

## 2011-01-31 ENCOUNTER — Other Ambulatory Visit: Payer: Self-pay | Admitting: Family Medicine

## 2011-03-06 ENCOUNTER — Encounter: Payer: Self-pay | Admitting: Family Medicine

## 2011-03-07 ENCOUNTER — Other Ambulatory Visit (HOSPITAL_COMMUNITY)
Admission: RE | Admit: 2011-03-07 | Discharge: 2011-03-07 | Disposition: A | Discharge status: Home / Self Care | Payer: BC Managed Care – PPO | Source: Ambulatory Visit | Attending: Family Medicine | Admitting: Family Medicine

## 2011-03-07 ENCOUNTER — Encounter: Payer: Self-pay | Admitting: Family Medicine

## 2011-03-07 ENCOUNTER — Telehealth: Payer: Self-pay | Admitting: Family Medicine

## 2011-03-07 ENCOUNTER — Ambulatory Visit (INDEPENDENT_AMBULATORY_CARE_PROVIDER_SITE_OTHER): Payer: BC Managed Care – PPO | Admitting: Family Medicine

## 2011-03-07 VITALS — BP 114/76 | HR 56 | Temp 97.7°F | Ht 70.5 in | Wt 200.2 lb

## 2011-03-07 DIAGNOSIS — Z01419 Encounter for gynecological examination (general) (routine) without abnormal findings: Secondary | ICD-10-CM | POA: Insufficient documentation

## 2011-03-07 DIAGNOSIS — N898 Other specified noninflammatory disorders of vagina: Secondary | ICD-10-CM

## 2011-03-07 DIAGNOSIS — Z0000 Encounter for general adult medical examination without abnormal findings: Secondary | ICD-10-CM

## 2011-03-07 DIAGNOSIS — E041 Nontoxic single thyroid nodule: Secondary | ICD-10-CM

## 2011-03-07 DIAGNOSIS — N9489 Other specified conditions associated with female genital organs and menstrual cycle: Secondary | ICD-10-CM

## 2011-03-07 DIAGNOSIS — E039 Hypothyroidism, unspecified: Secondary | ICD-10-CM

## 2011-03-07 LAB — POCT URINALYSIS DIPSTICK
Bilirubin, UA: NEGATIVE
Leukocytes, UA: NEGATIVE
Nitrite, UA: NEGATIVE
Protein, UA: NEGATIVE
Urobilinogen, UA: 0.2
pH, UA: 6.5

## 2011-03-07 LAB — BASIC METABOLIC PANEL
CO2: 29 mEq/L (ref 19–32)
Calcium: 9.3 mg/dL (ref 8.4–10.5)
Chloride: 104 mEq/L (ref 96–112)
Creatinine, Ser: 0.8 mg/dL (ref 0.4–1.2)
GFR: 77.05 mL/min (ref >60.00)
Glucose, Bld: 88 mg/dL (ref 70–99)

## 2011-03-07 LAB — CBC WITH DIFFERENTIAL/PLATELET
Basophils Relative: 0.7 % (ref 0.0–3.0)
Eosinophils Absolute: 0.1 10*3/uL (ref 0.0–0.7)
HCT: 42.8 % (ref 36.0–46.0)
Lymphs Abs: 1.8 10*3/uL (ref 0.7–4.0)
Monocytes Absolute: 0.4 10*3/uL (ref 0.1–1.0)
Monocytes Relative: 8.3 % (ref 3.0–12.0)
Neutro Abs: 2.4 10*3/uL (ref 1.4–7.7)
Neutrophils Relative %: 51.7 % (ref 43.0–77.0)
Platelets: 306 10*3/uL (ref 150.0–400.0)

## 2011-03-07 LAB — LDL CHOLESTEROL, DIRECT: Direct LDL: 136.2 mg/dL

## 2011-03-07 LAB — LIPID PANEL: HDL: 68.1 mg/dL (ref >39.00)

## 2011-03-07 LAB — HEPATIC FUNCTION PANEL
ALT: 24 U/L (ref 0–35)
AST: 20 U/L (ref 0–37)
Alkaline Phosphatase: 66 U/L (ref 39–117)
Bilirubin, Direct: 0 mg/dL (ref 0.0–0.3)
Total Bilirubin: 0.6 mg/dL (ref 0.3–1.2)
Total Protein: 7.3 g/dL (ref 6.0–8.3)

## 2011-03-07 LAB — T3, FREE: T3, Free: 2.5 pg/mL (ref 2.3–4.2)

## 2011-03-07 MED ORDER — VENLAFAXINE HCL ER 150 MG PO CP24: ORAL_CAPSULE | ORAL | Status: DC

## 2011-03-07 MED ORDER — ESTROGENS, CONJUGATED 0.625 MG/GM VA CREA: TOPICAL_CREAM | VAGINAL | Status: AC

## 2011-03-07 NOTE — Progress Notes (Signed)
Addended by: Lelon Perla on: 03/07/2011 12:46 PM   Modules accepted: Orders

## 2011-03-07 NOTE — Progress Notes (Signed)
Subjective:     Michelle Pratt is a 55 y.o. female and is here for a comprehensive physical exam. The patient reports no problems.  History   Social History  . Marital Status: Married    Spouse Name: N/A    Number of Children: N/A  . Years of Education: N/A   Occupational History  . OR [employer]   Social History Main Topics  . Smoking status: Never Smoker   . Smokeless tobacco: Not on file  . Alcohol Use: 1.8 oz/week    3 Glasses of wine per week  . Drug Use: Not on file  . Sexually Active: Yes -- Female partner(s)   Other Topics Concern  . Not on file   Social History Narrative  . No narrative on file   Health Maintenance  Topic Date Due  . Pap Smear  08/15/1974  . Influenza Vaccine  02/11/2012  . Mammogram  12/04/2012  . Tetanus/tdap  10/27/2018  . Colonoscopy  11/04/2020    The following portions of the patient's history were reviewed and updated as appropriate: allergies, current medications, past family history, past medical history, past social history, past surgical history and problem list.  Review of Systems Review of Systems  Constitutional: Negative for activity change, appetite change and fatigue.  HENT: Negative for hearing loss, congestion, tinnitus and ear discharge.  dentist q59m Eyes: Negative for visual disturbance (see optho q1y -- vision corrected to 20/20 with glasses).  Respiratory: Negative for cough, chest tightness and shortness of breath.   Cardiovascular: Negative for chest pain, palpitations and leg swelling.  Gastrointestinal: Negative for abdominal pain, diarrhea, constipation and abdominal distention.  Genitourinary: Negative for urgency, frequency, decreased urine volume and difficulty urinating.  Musculoskeletal: Negative for back pain, arthralgias and gait problem.  Skin: Negative for color change, pallor and rash.  Neurological: Negative for dizziness, light-headedness, numbness and headaches.  Hematological: Negative  for adenopathy. Does not bruise/bleed easily.  Psychiatric/Behavioral: Negative for suicidal ideas, confusion, sleep disturbance, self-injury, dysphoric mood, decreased concentration and agitation.       Objective:    BP 114/76  Pulse 56  Temp(Src) 97.7 F (36.5 C) (Oral)  Ht 5' 10.5" (1.791 m)  Wt 200 lb 3.2 oz (90.81 kg)  BMI 28.32 kg/m2  SpO2 97% General appearance: alert, cooperative, appears stated age and no distress Head: Normocephalic, without obvious abnormality, atraumatic Eyes: conjunctivae/corneas clear. PERRL, EOM's intact. Fundi benign. Ears: normal TM's and external ear canals both ears Nose: Nares normal. Septum midline. Mucosa normal. No drainage or sinus tenderness. Throat: lips, mucosa, and tongue normal; teeth and gums normal Neck: no adenopathy, no carotid bruit, no JVD, supple, symmetrical, trachea midline and thyroid: nodular Lungs: clear to auscultation bilaterally Breasts: normal appearance, no masses or tenderness Heart: regular rate and rhythm, S1, S2 normal, no murmur, click, rub or gallop Abdomen: soft, non-tender; bowel sounds normal; no masses,  no organomegaly Pelvic: cervix normal in appearance, external genitalia normal, no adnexal masses or tenderness, no cervical motion tenderness, rectovaginal septum normal, uterus normal size, shape, and consistency and vagina normal without discharge Extremities: extremities normal, atraumatic, no cyanosis or edema Pulses: 2+ and symmetric Skin: Skin color, texture, turgor normal. No rashes or lesions Lymph nodes: Cervical, supraclavicular, and axillary nodes normal. Neurologic: Alert and oriented X 3, normal strength and tone. Normal symmetric reflexes. Normal coordination and gait Psych-- no anxiety / depression   Assessment:    Healthy female exam.  Hypothyroidism Hx thyroid nodule postmenopausal  Plan:    check fasting labs ghm utd  See After Visit Summary for Counseling Recommendations

## 2011-03-07 NOTE — Patient Instructions (Signed)

## 2011-03-07 NOTE — Telephone Encounter (Signed)
error 

## 2011-03-11 ENCOUNTER — Other Ambulatory Visit: Payer: Self-pay | Admitting: Family Medicine

## 2011-03-11 ENCOUNTER — Other Ambulatory Visit (HOSPITAL_COMMUNITY): Payer: BC Managed Care – PPO

## 2011-03-11 DIAGNOSIS — Z1231 Encounter for screening mammogram for malignant neoplasm of breast: Secondary | ICD-10-CM

## 2011-03-13 ENCOUNTER — Ambulatory Visit (HOSPITAL_COMMUNITY)
Admission: RE | Admit: 2011-03-13 | Discharge: 2011-03-13 | Disposition: A | Discharge status: Home / Self Care | Payer: BC Managed Care – PPO | Source: Ambulatory Visit | Attending: Family Medicine | Admitting: Family Medicine

## 2011-03-13 DIAGNOSIS — E041 Nontoxic single thyroid nodule: Secondary | ICD-10-CM | POA: Insufficient documentation

## 2011-03-28 ENCOUNTER — Other Ambulatory Visit: Payer: Self-pay | Admitting: Family Medicine

## 2011-05-21 ENCOUNTER — Ambulatory Visit
Admission: RE | Admit: 2011-05-21 | Discharge: 2011-05-21 | Disposition: A | Discharge status: Home / Self Care | Payer: 59 | Source: Ambulatory Visit | Attending: Family Medicine | Admitting: Family Medicine

## 2011-05-21 DIAGNOSIS — Z1231 Encounter for screening mammogram for malignant neoplasm of breast: Secondary | ICD-10-CM

## 2011-10-08 ENCOUNTER — Other Ambulatory Visit: Payer: Self-pay | Admitting: Family Medicine

## 2011-11-07 ENCOUNTER — Other Ambulatory Visit: Payer: Self-pay | Admitting: Family Medicine

## 2012-01-18 IMAGING — US US SOFT TISSUE HEAD/NECK
1 series · 14 of 25 positions shown · non-contrast
Comparison: 03/07/2010

CLINICAL DATA: Left-sided pain, nodule

THYROID ULTRASOUND
TECHNIQUE: Ultrasound examination of the thyroid gland and adjacent
soft tissues was performed.

[Series 1: us soft tissue head/neck · 0.08mm/px · 14 of 34 slices shown]
[im 1/34]
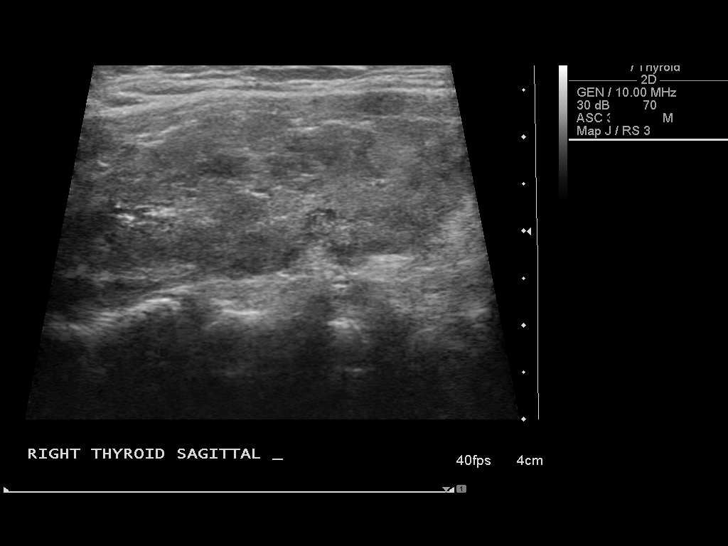
[im 3/34]
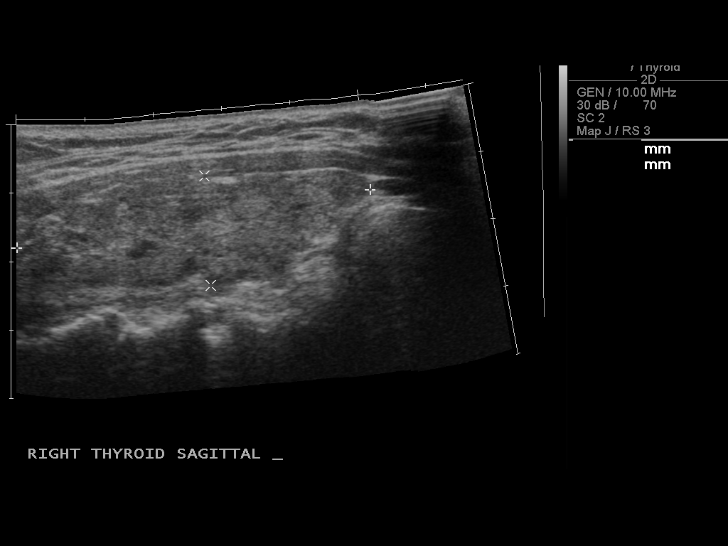
[im 6/34]
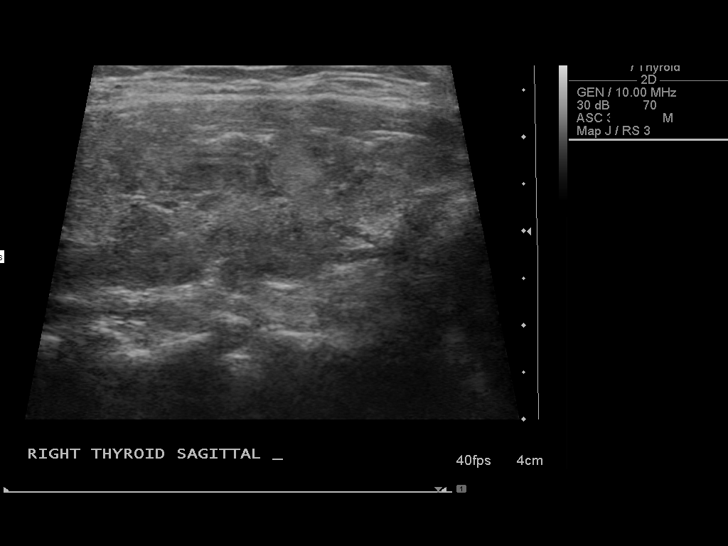
[im 9/34]
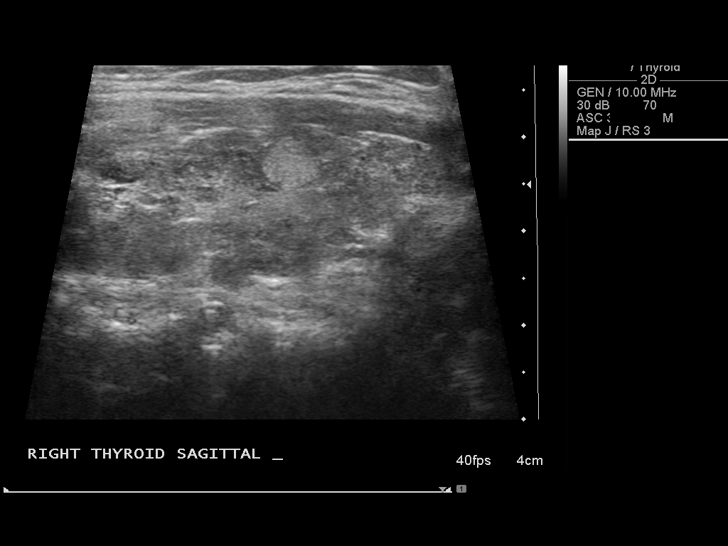
[im 12/34]
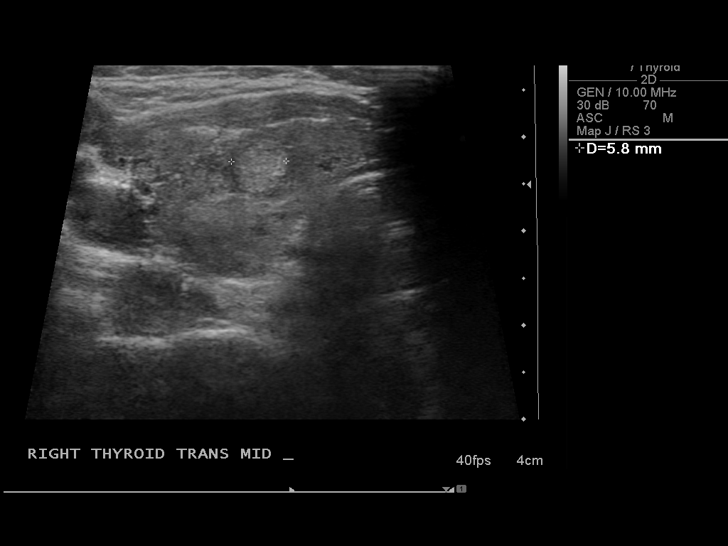
[im 13/34]
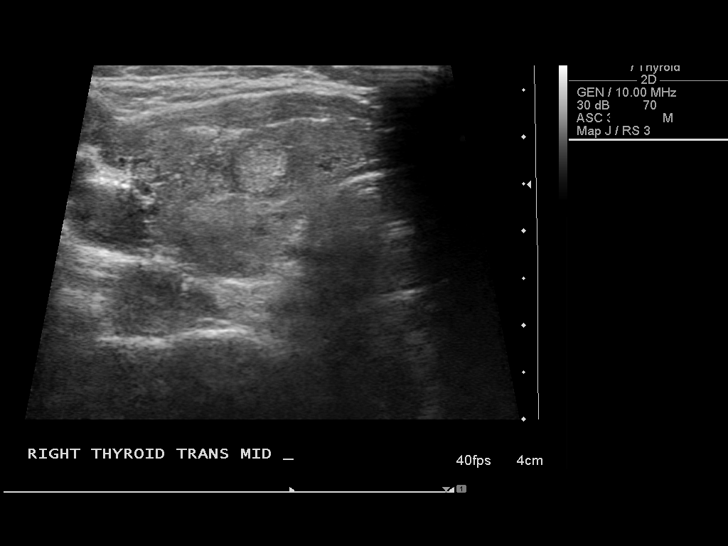
[im 16/34]
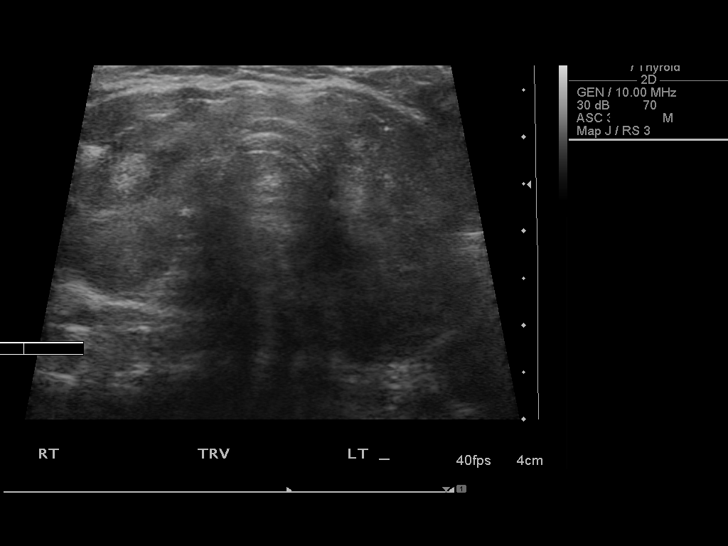
[im 18/34]
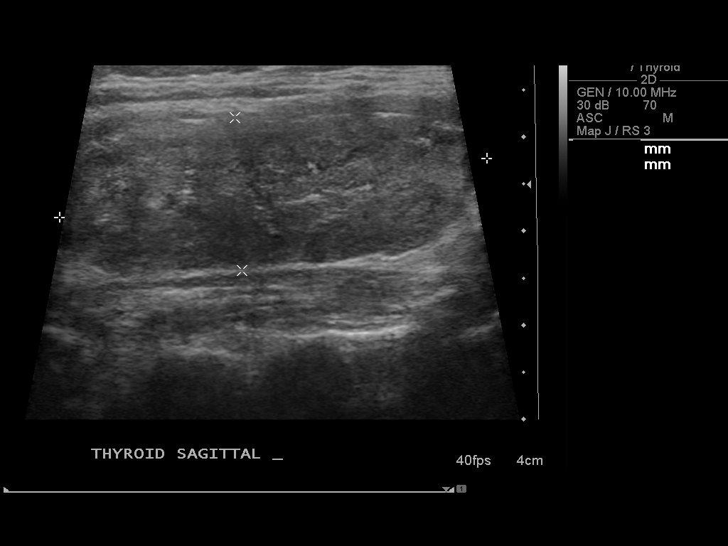
[im 21/34]
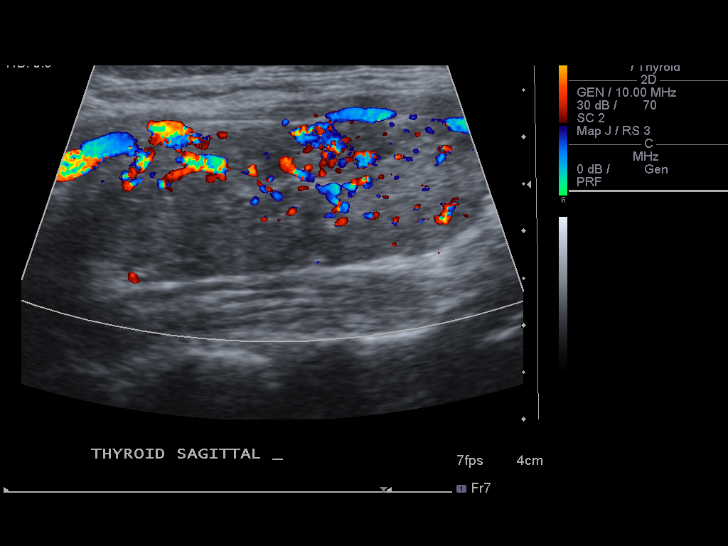
[im 23/34]
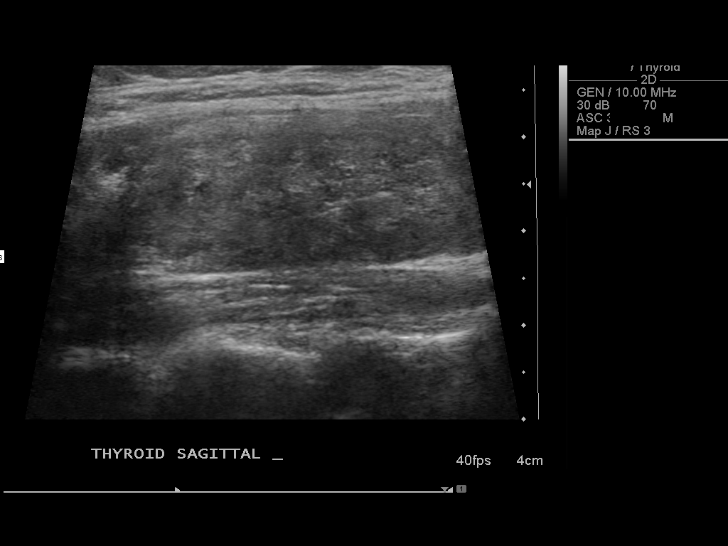
[im 25/34]
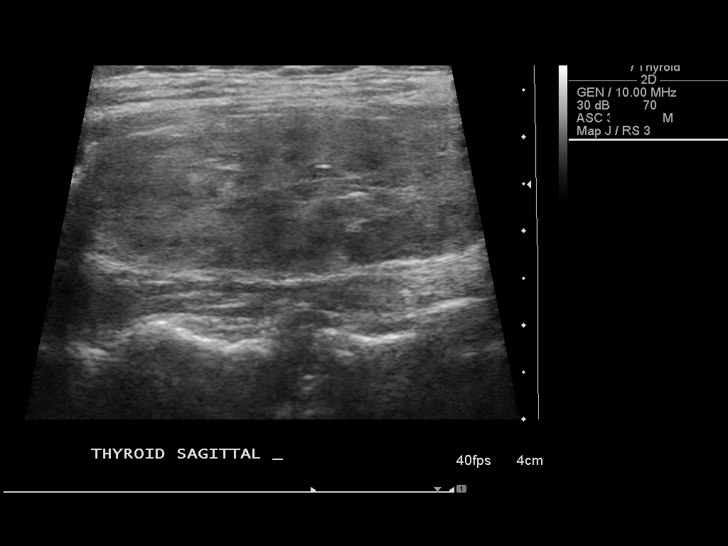
[im 28/34]
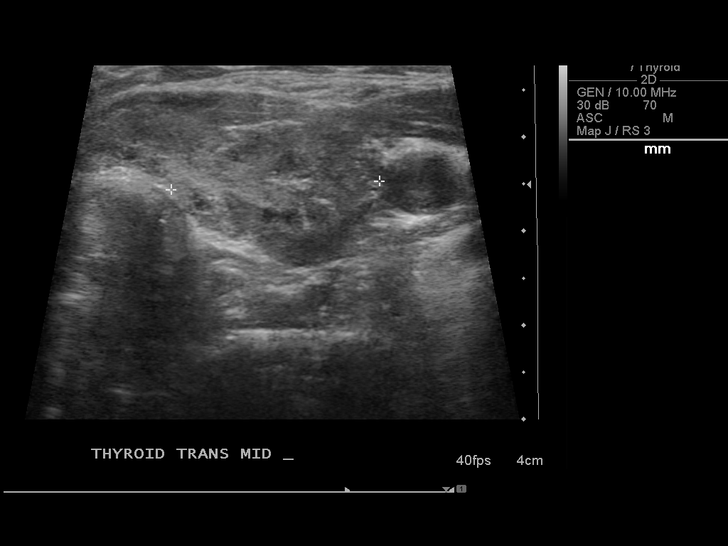
[im 31/34]
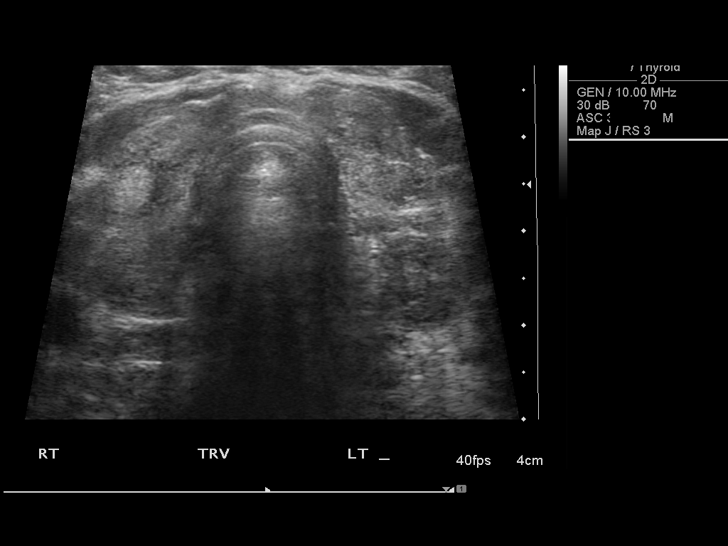
[im 34/34]
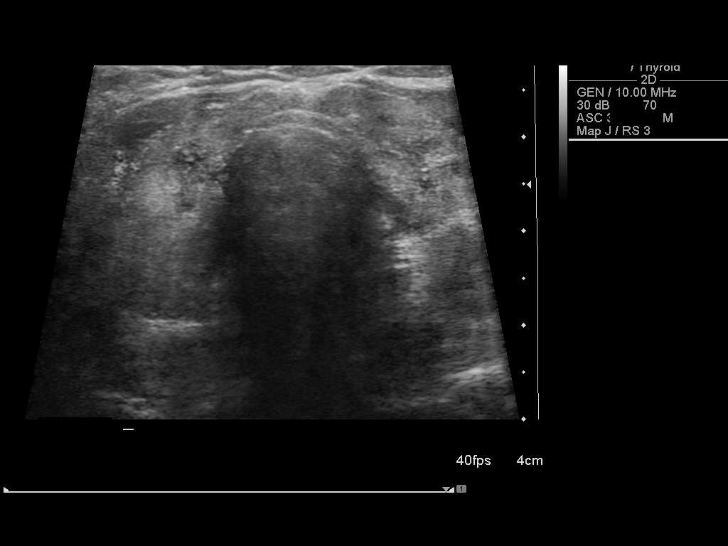

[14 of 25 positions shown; findings below may reference images not displayed]

FINDINGS: Right thyroid lobe:  16 x 20 x 52 mm, inhomogeneous
Left thyroid lobe:  16 x 22 x 46 mm
Isthmus:  2.6 mm in thickness

Focal nodules:  6 x 6 x 7 mm hyperechoic solid, mid-right

Lymphadenopathy:  None visualized.
IMPRESSION: 1.  Single sub centimeter right thyroid nodule, too small for
biopsy.  Follow up should be based on clinical parameters. This
recommendation follows the consensus statement:  Management of
Thyroid Nodules Detected as US:  Society of Radiologists in
800.  Available online at :
[URL]

## 2012-02-18 ENCOUNTER — Other Ambulatory Visit: Payer: Self-pay | Admitting: Family Medicine

## 2012-03-11 ENCOUNTER — Ambulatory Visit (INDEPENDENT_AMBULATORY_CARE_PROVIDER_SITE_OTHER): Payer: 59 | Admitting: Family Medicine

## 2012-03-11 ENCOUNTER — Encounter: Payer: Self-pay | Admitting: Family Medicine

## 2012-03-11 ENCOUNTER — Other Ambulatory Visit (HOSPITAL_COMMUNITY)
Admission: RE | Admit: 2012-03-11 | Discharge: 2012-03-11 | Disposition: A | Discharge status: Home / Self Care | Payer: 59 | Source: Ambulatory Visit | Attending: Family Medicine | Admitting: Family Medicine

## 2012-03-11 VITALS — BP 118/70 | HR 65 | Temp 98.4°F | Ht 69.75 in | Wt 198.6 lb

## 2012-03-11 DIAGNOSIS — M2550 Pain in unspecified joint: Secondary | ICD-10-CM

## 2012-03-11 DIAGNOSIS — E039 Hypothyroidism, unspecified: Secondary | ICD-10-CM

## 2012-03-11 DIAGNOSIS — Z0000 Encounter for general adult medical examination without abnormal findings: Secondary | ICD-10-CM

## 2012-03-11 DIAGNOSIS — E785 Hyperlipidemia, unspecified: Secondary | ICD-10-CM

## 2012-03-11 DIAGNOSIS — Z124 Encounter for screening for malignant neoplasm of cervix: Secondary | ICD-10-CM

## 2012-03-11 DIAGNOSIS — N951 Menopausal and female climacteric states: Secondary | ICD-10-CM

## 2012-03-11 DIAGNOSIS — Z01419 Encounter for gynecological examination (general) (routine) without abnormal findings: Secondary | ICD-10-CM | POA: Insufficient documentation

## 2012-03-11 LAB — BASIC METABOLIC PANEL
CO2: 30 mEq/L (ref 19–32)
Chloride: 106 mEq/L (ref 96–112)
Glucose, Bld: 77 mg/dL (ref 70–99)
Sodium: 141 mEq/L (ref 135–145)

## 2012-03-11 LAB — CBC WITH DIFFERENTIAL/PLATELET
Basophils Relative: 0.6 % (ref 0.0–3.0)
Eosinophils Absolute: 0.1 10*3/uL (ref 0.0–0.7)
HCT: 41 % (ref 36.0–46.0)
Hemoglobin: 13.6 g/dL (ref 12.0–15.0)
Lymphs Abs: 1.8 10*3/uL (ref 0.7–4.0)
MCHC: 33.1 g/dL (ref 30.0–36.0)
MCV: 89.6 fl (ref 78.0–100.0)
Monocytes Absolute: 0.4 10*3/uL (ref 0.1–1.0)
Neutro Abs: 2.5 10*3/uL (ref 1.4–7.7)
RBC: 4.58 Mil/uL (ref 3.87–5.11)
RDW: 13.6 % (ref 11.5–14.6)

## 2012-03-11 LAB — POCT URINALYSIS DIPSTICK
Blood, UA: NEGATIVE
Ketones, UA: NEGATIVE
Protein, UA: NEGATIVE
Spec Grav, UA: 1.01

## 2012-03-11 LAB — LIPID PANEL
Cholesterol: 207 mg/dL — ABNORMAL HIGH (ref 0–200)
HDL: 57.5 mg/dL (ref >39.00)

## 2012-03-11 LAB — LDL CHOLESTEROL, DIRECT: Direct LDL: 133.3 mg/dL

## 2012-03-11 LAB — HEPATIC FUNCTION PANEL
Albumin: 4 g/dL (ref 3.5–5.2)
Alkaline Phosphatase: 62 U/L (ref 39–117)
Total Protein: 6.9 g/dL (ref 6.0–8.3)

## 2012-03-11 LAB — TSH: TSH: 1.81 u[IU]/mL (ref 0.35–5.50)

## 2012-03-11 NOTE — Patient Instructions (Addendum)
Preventive Care for Adults, Female A healthy lifestyle and preventive care can promote health and wellness. Preventive health guidelines for women include the following key practices.  A routine yearly physical is a good way to check with your caregiver about your health and preventive screening. It is a chance to share any concerns and updates on your health, and to receive a thorough exam.  Visit your dentist for a routine exam and preventive care every 6 months. Brush your teeth twice a day and floss once a day. Good oral hygiene prevents tooth decay and gum disease.  The frequency of eye exams is based on your age, health, family medical history, use of contact lenses, and other factors. Follow your caregiver's recommendations for frequency of eye exams.  Eat a healthy diet. Foods like vegetables, fruits, whole grains, low-fat dairy products, and lean protein foods contain the nutrients you need without too many calories. Decrease your intake of foods high in solid fats, added sugars, and salt. Eat the right amount of calories for you.Get information about a proper diet from your caregiver, if necessary.  Regular physical exercise is one of the most important things you can do for your health. Most adults should get at least 150 minutes of moderate-intensity exercise (any activity that increases your heart rate and causes you to sweat) each week. In addition, most adults need muscle-strengthening exercises on 2 or more days a week.  Maintain a healthy weight. The body mass index (BMI) is a screening tool to identify possible weight problems. It provides an estimate of body fat based on height and weight. Your caregiver can help determine your BMI, and can help you achieve or maintain a healthy weight.For adults 20 years and older:  A BMI below 18.5 is considered underweight.  A BMI of 18.5 to 24.9 is normal.  A BMI of 25 to 29.9 is considered overweight.  A BMI of 30 and above is  considered obese.  Maintain normal blood lipids and cholesterol levels by exercising and minimizing your intake of saturated fat. Eat a balanced diet with plenty of fruit and vegetables. Blood tests for lipids and cholesterol should begin at age 20 and be repeated every 5 years. If your lipid or cholesterol levels are high, you are over 50, or you are at high risk for heart disease, you may need your cholesterol levels checked more frequently.Ongoing high lipid and cholesterol levels should be treated with medicines if diet and exercise are not effective.  If you smoke, find out from your caregiver how to quit. If you do not use tobacco, do not start.  If you are pregnant, do not drink alcohol. If you are breastfeeding, be very cautious about drinking alcohol. If you are not pregnant and choose to drink alcohol, do not exceed 1 drink per day. One drink is considered to be 12 ounces (355 mL) of beer, 5 ounces (148 mL) of wine, or 1.5 ounces (44 mL) of liquor.  Avoid use of street drugs. Do not share needles with anyone. Ask for help if you need support or instructions about stopping the use of drugs.  High blood pressure causes heart disease and increases the risk of stroke. Your blood pressure should be checked at least every 1 to 2 years. Ongoing high blood pressure should be treated with medicines if weight loss and exercise are not effective.  If you are 55 to 55 years old, ask your caregiver if you should take aspirin to prevent strokes.  Diabetes   screening involves taking a blood sample to check your fasting blood sugar level. This should be done once every 3 years, after age 45, if you are within normal weight and without risk factors for diabetes. Testing should be considered at a younger age or be carried out more frequently if you are overweight and have at least 1 risk factor for diabetes.  Breast cancer screening is essential preventive care for women. You should practice "breast  self-awareness." This means understanding the normal appearance and feel of your breasts and may include breast self-examination. Any changes detected, no matter how small, should be reported to a caregiver. Women in their 20s and 30s should have a clinical breast exam (CBE) by a caregiver as part of a regular health exam every 1 to 3 years. After age 40, women should have a CBE every year. Starting at age 40, women should consider having a mammography (breast X-ray test) every year. Women who have a family history of breast cancer should talk to their caregiver about genetic screening. Women at a high risk of breast cancer should talk to their caregivers about having magnetic resonance imaging (MRI) and a mammography every year.  The Pap test is a screening test for cervical cancer. A Pap test can show cell changes on the cervix that might become cervical cancer if left untreated. A Pap test is a procedure in which cells are obtained and examined from the lower end of the uterus (cervix).  Women should have a Pap test starting at age 21.  Between ages 21 and 29, Pap tests should be repeated every 2 years.  Beginning at age 30, you should have a Pap test every 3 years as long as the past 3 Pap tests have been normal.  Some women have medical problems that increase the chance of getting cervical cancer. Talk to your caregiver about these problems. It is especially important to talk to your caregiver if a new problem develops soon after your last Pap test. In these cases, your caregiver may recommend more frequent screening and Pap tests.  The above recommendations are the same for women who have or have not gotten the vaccine for human papillomavirus (HPV).  If you had a hysterectomy for a problem that was not cancer or a condition that could lead to cancer, then you no longer need Pap tests. Even if you no longer need a Pap test, a regular exam is a good idea to make sure no other problems are  starting.  If you are between ages 65 and 70, and you have had normal Pap tests going back 10 years, you no longer need Pap tests. Even if you no longer need a Pap test, a regular exam is a good idea to make sure no other problems are starting.  If you have had past treatment for cervical cancer or a condition that could lead to cancer, you need Pap tests and screening for cancer for at least 20 years after your treatment.  If Pap tests have been discontinued, risk factors (such as a new sexual partner) need to be reassessed to determine if screening should be resumed.  The HPV test is an additional test that may be used for cervical cancer screening. The HPV test looks for the virus that can cause the cell changes on the cervix. The cells collected during the Pap test can be tested for HPV. The HPV test could be used to screen women aged 30 years and older, and should   be used in women of any age who have unclear Pap test results. After the age of 30, women should have HPV testing at the same frequency as a Pap test.  Colorectal cancer can be detected and often prevented. Most routine colorectal cancer screening begins at the age of 50 and continues through age 75. However, your caregiver may recommend screening at an earlier age if you have risk factors for colon cancer. On a yearly basis, your caregiver may provide home test kits to check for hidden blood in the stool. Use of a small camera at the end of a tube, to directly examine the colon (sigmoidoscopy or colonoscopy), can detect the earliest forms of colorectal cancer. Talk to your caregiver about this at age 50, when routine screening begins. Direct examination of the colon should be repeated every 5 to 10 years through age 75, unless early forms of pre-cancerous polyps or small growths are found.  Hepatitis C blood testing is recommended for all people born from 1945 through 1965 and any individual with known risks for hepatitis C.  Practice  safe sex. Use condoms and avoid high-risk sexual practices to reduce the spread of sexually transmitted infections (STIs). STIs include gonorrhea, chlamydia, syphilis, trichomonas, herpes, HPV, and human immunodeficiency virus (HIV). Herpes, HIV, and HPV are viral illnesses that have no cure. They can result in disability, cancer, and death. Sexually active women aged 25 and younger should be checked for chlamydia. Older women with new or multiple partners should also be tested for chlamydia. Testing for other STIs is recommended if you are sexually active and at increased risk.  Osteoporosis is a disease in which the bones lose minerals and strength with aging. This can result in serious bone fractures. The risk of osteoporosis can be identified using a bone density scan. Women ages 65 and over and women at risk for fractures or osteoporosis should discuss screening with their caregivers. Ask your caregiver whether you should take a calcium supplement or vitamin D to reduce the rate of osteoporosis.  Menopause can be associated with physical symptoms and risks. Hormone replacement therapy is available to decrease symptoms and risks. You should talk to your caregiver about whether hormone replacement therapy is right for you.  Use sunscreen with sun protection factor (SPF) of 30 or more. Apply sunscreen liberally and repeatedly throughout the day. You should seek shade when your shadow is shorter than you. Protect yourself by wearing long sleeves, pants, a wide-brimmed hat, and sunglasses year round, whenever you are outdoors.  Once a month, do a whole body skin exam, using a mirror to look at the skin on your back. Notify your caregiver of new moles, moles that have irregular borders, moles that are larger than a pencil eraser, or moles that have changed in shape or color.  Stay current with required immunizations.  Influenza. You need a dose every fall (or winter). The composition of the flu vaccine  changes each year, so being vaccinated once is not enough.  Pneumococcal polysaccharide. You need 1 to 2 doses if you smoke cigarettes or if you have certain chronic medical conditions. You need 1 dose at age 65 (or older) if you have never been vaccinated.  Tetanus, diphtheria, pertussis (Tdap, Td). Get 1 dose of Tdap vaccine if you are younger than age 65, are over 65 and have contact with an infant, are a healthcare worker, are pregnant, or simply want to be protected from whooping cough. After that, you need a Td   booster dose every 10 years. Consult your caregiver if you have not had at least 3 tetanus and diphtheria-containing shots sometime in your life or have a deep or dirty wound.  HPV. You need this vaccine if you are a woman age 26 or younger. The vaccine is given in 3 doses over 6 months.  Measles, mumps, rubella (MMR). You need at least 1 dose of MMR if you were born in 1957 or later. You may also need a second dose.  Meningococcal. If you are age 19 to 21 and a first-year college student living in a residence hall, or have one of several medical conditions, you need to get vaccinated against meningococcal disease. You may also need additional booster doses.  Zoster (shingles). If you are age 60 or older, you should get this vaccine.  Varicella (chickenpox). If you have never had chickenpox or you were vaccinated but received only 1 dose, talk to your caregiver to find out if you need this vaccine.  Hepatitis A. You need this vaccine if you have a specific risk factor for hepatitis A virus infection or you simply wish to be protected from this disease. The vaccine is usually given as 2 doses, 6 to 18 months apart.  Hepatitis B. You need this vaccine if you have a specific risk factor for hepatitis B virus infection or you simply wish to be protected from this disease. The vaccine is given in 3 doses, usually over 6 months. Preventive Services / Frequency Ages 19 to 39  Blood  pressure check.** / Every 1 to 2 years.  Lipid and cholesterol check.** / Every 5 years beginning at age 20.  Clinical breast exam.** / Every 3 years for women in their 20s and 30s.  Pap test.** / Every 2 years from ages 21 through 29. Every 3 years starting at age 30 through age 65 or 70 with a history of 3 consecutive normal Pap tests.  HPV screening.** / Every 3 years from ages 30 through ages 65 to 70 with a history of 3 consecutive normal Pap tests.  Hepatitis C blood test.** / For any individual with known risks for hepatitis C.  Skin self-exam. / Monthly.  Influenza immunization.** / Every year.  Pneumococcal polysaccharide immunization.** / 1 to 2 doses if you smoke cigarettes or if you have certain chronic medical conditions.  Tetanus, diphtheria, pertussis (Tdap, Td) immunization. / A one-time dose of Tdap vaccine. After that, you need a Td booster dose every 10 years.  HPV immunization. / 3 doses over 6 months, if you are 26 and younger.  Measles, mumps, rubella (MMR) immunization. / You need at least 1 dose of MMR if you were born in 1957 or later. You may also need a second dose.  Meningococcal immunization. / 1 dose if you are age 19 to 21 and a first-year college student living in a residence hall, or have one of several medical conditions, you need to get vaccinated against meningococcal disease. You may also need additional booster doses.  Varicella immunization.** / Consult your caregiver.  Hepatitis A immunization.** / Consult your caregiver. 2 doses, 6 to 18 months apart.  Hepatitis B immunization.** / Consult your caregiver. 3 doses usually over 6 months. Ages 40 to 64  Blood pressure check.** / Every 1 to 2 years.  Lipid and cholesterol check.** / Every 5 years beginning at age 20.  Clinical breast exam.** / Every year after age 40.  Mammogram.** / Every year beginning at age 40   and continuing for as long as you are in good health. Consult with your  caregiver.  Pap test.** / Every 3 years starting at age 30 through age 65 or 70 with a history of 3 consecutive normal Pap tests.  HPV screening.** / Every 3 years from ages 30 through ages 65 to 70 with a history of 3 consecutive normal Pap tests.  Fecal occult blood test (FOBT) of stool. / Every year beginning at age 50 and continuing until age 75. You may not need to do this test if you get a colonoscopy every 10 years.  Flexible sigmoidoscopy or colonoscopy.** / Every 5 years for a flexible sigmoidoscopy or every 10 years for a colonoscopy beginning at age 50 and continuing until age 75.  Hepatitis C blood test.** / For all people born from 1945 through 1965 and any individual with known risks for hepatitis C.  Skin self-exam. / Monthly.  Influenza immunization.** / Every year.  Pneumococcal polysaccharide immunization.** / 1 to 2 doses if you smoke cigarettes or if you have certain chronic medical conditions.  Tetanus, diphtheria, pertussis (Tdap, Td) immunization.** / A one-time dose of Tdap vaccine. After that, you need a Td booster dose every 10 years.  Measles, mumps, rubella (MMR) immunization. / You need at least 1 dose of MMR if you were born in 1957 or later. You may also need a second dose.  Varicella immunization.** / Consult your caregiver.  Meningococcal immunization.** / Consult your caregiver.  Hepatitis A immunization.** / Consult your caregiver. 2 doses, 6 to 18 months apart.  Hepatitis B immunization.** / Consult your caregiver. 3 doses, usually over 6 months. Ages 65 and over  Blood pressure check.** / Every 1 to 2 years.  Lipid and cholesterol check.** / Every 5 years beginning at age 20.  Clinical breast exam.** / Every year after age 40.  Mammogram.** / Every year beginning at age 40 and continuing for as long as you are in good health. Consult with your caregiver.  Pap test.** / Every 3 years starting at age 30 through age 65 or 70 with a 3  consecutive normal Pap tests. Testing can be stopped between 65 and 70 with 3 consecutive normal Pap tests and no abnormal Pap or HPV tests in the past 10 years.  HPV screening.** / Every 3 years from ages 30 through ages 65 or 70 with a history of 3 consecutive normal Pap tests. Testing can be stopped between 65 and 70 with 3 consecutive normal Pap tests and no abnormal Pap or HPV tests in the past 10 years.  Fecal occult blood test (FOBT) of stool. / Every year beginning at age 50 and continuing until age 75. You may not need to do this test if you get a colonoscopy every 10 years.  Flexible sigmoidoscopy or colonoscopy.** / Every 5 years for a flexible sigmoidoscopy or every 10 years for a colonoscopy beginning at age 50 and continuing until age 75.  Hepatitis C blood test.** / For all people born from 1945 through 1965 and any individual with known risks for hepatitis C.  Osteoporosis screening.** / A one-time screening for women ages 65 and over and women at risk for fractures or osteoporosis.  Skin self-exam. / Monthly.  Influenza immunization.** / Every year.  Pneumococcal polysaccharide immunization.** / 1 dose at age 65 (or older) if you have never been vaccinated.  Tetanus, diphtheria, pertussis (Tdap, Td) immunization. / A one-time dose of Tdap vaccine if you are over   65 and have contact with an infant, are a healthcare worker, or simply want to be protected from whooping cough. After that, you need a Td booster dose every 10 years.  Varicella immunization.** / Consult your caregiver.  Meningococcal immunization.** / Consult your caregiver.  Hepatitis A immunization.** / Consult your caregiver. 2 doses, 6 to 18 months apart.  Hepatitis B immunization.** / Check with your caregiver. 3 doses, usually over 6 months. ** Family history and personal history of risk and conditions may change your caregiver's recommendations. Document Released: 06/25/2001 Document Revised: 07/22/2011  Document Reviewed: 09/24/2010 [employer] Patient Information 2013 [employer].  

## 2012-03-11 NOTE — Progress Notes (Signed)
hSubjective:     Michelle Pratt is a 56 y.o. female and is here for a comprehensive physical exam. The patient reports no problems.  History   Social History  . Marital Status: Married    Spouse Name: N/A    Number of Children: N/A  . 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 Smoking status: Never Smoker   . Smokeless tobacco: Not on file  . Alcohol Use: 1.8 oz/week    3 Glasses of wine per week  . Drug Use: No  . Sexually Active: Yes -- Female partner(s)   Other Topics Concern  . Not on file   Social History Narrative   Exercise-- water aerobics    Health Maintenance  Topic Date Due  . Influenza Vaccine  01/12/2012  . Mammogram  05/20/2013  . Pap Smear  03/12/2015  . Tetanus/tdap  10/27/2018  . Colonoscopy  11/04/2020    The following portions of the patient's history were reviewed and updated as appropriate:  She  has a past medical history of Thyroid disease and Menopause. She  does not have any pertinent problems on file. She  has past surgical history that includes Tonsillectomy; Tubal ligation; oraf; and ORIF elbow fracture. Her family history includes Alcohol abuse in her mother; Cancer (age of onset:89) in her father; Hypertension in her father; Parkinsonism in her mother; and Stroke in her brother. She  reports that she has never smoked. She does not have any smokeless tobacco history on file. She reports that she drinks about 1.8 ounces of alcohol per week. She reports that she does not use illicit drugs. She has a current medication list which includes the following prescription(s): epinephrine, levothyroxine, and venlafaxine xr. Current Outpatient Prescriptions on File Prior to Visit  Medication Sig Dispense Refill  . EPINEPHrine (EPIPEN JR) 0.15 MG/0.3ML injection Inject 0.15 mg into the muscle as needed.        [name] levothyroxine (SYNTHROID) 112 MCG tablet 1 tab by mouth daily--repeat labs are  due  30 tablet  0  . venlafaxine (EFFEXOR XR) 150 MG 24 hr capsule 1 po qd  30 capsule  11   She is allergic to morphine..  Review of Systems Review of Systems  Constitutional: Negative for activity change, appetite change and fatigue.  HENT: Negative for hearing loss, congestion, tinnitus and ear discharge.  dentist q63m Eyes: Negative for visual disturbance (see optho q1y -- vision corrected to 20/20 with glasses).  Respiratory: Negative for cough, chest tightness and shortness of breath.   Cardiovascular: Negative for chest pain, palpitations and leg swelling.  Gastrointestinal: Negative for abdominal pain, diarrhea, constipation and abdominal distention.  Genitourinary: Negative for urgency, frequency, decreased urine volume and difficulty urinating.  Musculoskeletal: Negative for back pain, arthralgias and gait problem.  Skin: Negative for color change, pallor and rash.  Neurological: Negative for dizziness, light-headedness, numbness and headaches.  Hematological: Negative for adenopathy. Does not bruise/bleed easily.  Psychiatric/Behavioral: Negative for suicidal ideas, confusion, sleep disturbance, self-injury, dysphoric mood, decreased concentration and agitation.       Objective:    BP 118/70  Pulse 65  Temp 98.4 F (36.9 C) (Oral)  Ht 5' 9.75" (1.772 m)  Wt 198 lb 9.6 oz (90.084 kg)  BMI 28.70 kg/m2  SpO2 97% General appearance: alert, cooperative, appears stated age and no distress Head: Normocephalic, without obvious abnormality, atraumatic Eyes: conjunctivae/corneas clear. PERRL, EOM's intact.  Fundi benign. Ears: normal TM's and external ear canals both ears Nose: Nares normal. Septum midline. Mucosa normal. No drainage or sinus tenderness. Throat: lips, mucosa, and tongue normal; teeth and gums normal Neck: no adenopathy, no carotid bruit, no JVD, supple, symmetrical, trachea midline and thyroid not enlarged, symmetric, no tenderness/mass/nodules Back:  symmetric, no curvature. ROM normal. No CVA tenderness. Lungs: clear to auscultation bilaterally Breasts: normal appearance, no masses or tenderness Heart: regular rate and rhythm, S1, S2 normal, no murmur, click, rub or gallop Abdomen: soft, non-tender; bowel sounds normal; no masses,  no organomegaly Pelvic: cervix normal in appearance, external genitalia normal, no adnexal masses or tenderness, no cervical motion tenderness, rectovaginal septum normal, uterus normal size, shape, and consistency and vagina normal without discharge Extremities: extremities normal, atraumatic, no cyanosis or edema Pulses: 2+ and symmetric Skin: Skin color, texture, turgor normal. No rashes or lesions Lymph nodes: Cervical, supraclavicular, and axillary nodes normal. Neurologic: Alert and oriented X 3, normal strength and tone. Normal symmetric reflexes. Normal coordination and gait    Assessment:    Healthy female exam.      Plan:    ghm utd Check labs See After Visit Summary for Counseling Recommendations

## 2012-03-11 NOTE — Assessment & Plan Note (Signed)
Check labs 

## 2012-03-11 NOTE — Assessment & Plan Note (Signed)
On effexor.   

## 2012-03-18 ENCOUNTER — Other Ambulatory Visit: Payer: Self-pay

## 2012-03-18 ENCOUNTER — Other Ambulatory Visit: Payer: Self-pay | Admitting: Family Medicine

## 2012-03-18 MED ORDER — VENLAFAXINE HCL ER 150 MG PO CP24: 150.0000 mg | ORAL_CAPSULE | Freq: Every day | ORAL | Status: DC

## 2012-03-18 MED ORDER — LEVOTHYROXINE SODIUM 112 MCG PO TABS: 112.0000 ug | ORAL_TABLET | Freq: Every day | ORAL | Status: DC

## 2012-03-18 NOTE — Telephone Encounter (Signed)
Rx faxed.     MW  

## 2012-03-27 ENCOUNTER — Other Ambulatory Visit: Payer: Self-pay

## 2012-09-17 ENCOUNTER — Other Ambulatory Visit: Payer: Self-pay | Admitting: Family Medicine

## 2012-10-06 ENCOUNTER — Other Ambulatory Visit: Payer: Self-pay | Admitting: Family Medicine

## 2012-10-06 NOTE — Telephone Encounter (Signed)
Med filled.  

## 2012-10-20 ENCOUNTER — Other Ambulatory Visit: Payer: Self-pay | Admitting: Family Medicine

## 2012-10-30 ENCOUNTER — Ambulatory Visit (INDEPENDENT_AMBULATORY_CARE_PROVIDER_SITE_OTHER): Payer: 59 | Admitting: Family Medicine

## 2012-10-30 ENCOUNTER — Encounter: Payer: Self-pay | Admitting: Family Medicine

## 2012-10-30 VITALS — BP 140/94 | HR 67 | Temp 98.3°F | Wt 204.0 lb

## 2012-10-30 DIAGNOSIS — N943 Premenstrual tension syndrome: Secondary | ICD-10-CM

## 2012-10-30 DIAGNOSIS — E042 Nontoxic multinodular goiter: Secondary | ICD-10-CM

## 2012-10-30 DIAGNOSIS — M79609 Pain in unspecified limb: Secondary | ICD-10-CM

## 2012-10-30 DIAGNOSIS — E039 Hypothyroidism, unspecified: Secondary | ICD-10-CM

## 2012-10-30 DIAGNOSIS — M722 Plantar fascial fibromatosis: Secondary | ICD-10-CM

## 2012-10-30 DIAGNOSIS — E041 Nontoxic single thyroid nodule: Secondary | ICD-10-CM

## 2012-10-30 DIAGNOSIS — M79671 Pain in right foot: Secondary | ICD-10-CM

## 2012-10-30 HISTORY — DX: Pain in right foot: M79.671

## 2012-10-30 MED ORDER — LEVOTHYROXINE SODIUM 112 MCG PO TABS: ORAL_TABLET | ORAL | Status: DC

## 2012-10-30 MED ORDER — VENLAFAXINE HCL ER 150 MG PO CP24: ORAL_CAPSULE | ORAL | Status: DC

## 2012-10-30 NOTE — Progress Notes (Signed)
  Subjective:    Patient ID: Michelle Pratt, female    DOB: 11-05-1955, 57 y.o.   MRN: [id number]  HPI Pt here for f/u thyroid.  She states sometimes she feels pain in neck.  She has a hx of thyroid nodules.   Pt also c/o pain in feet c/w plantar fascitis but she also has heel pain and has pain on top of feet as well.      Review of Systems As above    Objective:   Physical Exam  BP 140/94  Pulse 67  Temp(Src) 98.3 F (36.8 C) (Oral)  Wt 204 lb (92.534 kg)  BMI 29.47 kg/m2  SpO2 98% General appearance: alert, cooperative, appears stated age and no distress Throat: lips, mucosa, and tongue normal; teeth and gums normal Neck: no adenopathy, supple, symmetrical, trachea midline and thyroid: enlarged Lungs: clear to auscultation bilaterally Extremities: extremities normal, atraumatic, no cyanosis or edema       Assessment & Plan:

## 2012-10-30 NOTE — Patient Instructions (Addendum)

## 2012-10-30 NOTE — Assessment & Plan Note (Signed)
Recheck US thyroid

## 2012-10-30 NOTE — Assessment & Plan Note (Signed)
Refer to podiatry

## 2012-11-06 ENCOUNTER — Ambulatory Visit (HOSPITAL_COMMUNITY)
Admission: RE | Admit: 2012-11-06 | Discharge: 2012-11-06 | Disposition: A | Discharge status: Home / Self Care | Payer: 59 | Source: Ambulatory Visit | Attending: Family Medicine | Admitting: Family Medicine

## 2012-11-06 DIAGNOSIS — E041 Nontoxic single thyroid nodule: Secondary | ICD-10-CM | POA: Insufficient documentation

## 2012-11-06 DIAGNOSIS — E039 Hypothyroidism, unspecified: Secondary | ICD-10-CM | POA: Insufficient documentation

## 2012-11-23 ENCOUNTER — Other Ambulatory Visit: Payer: Self-pay

## 2012-11-23 DIAGNOSIS — E041 Nontoxic single thyroid nodule: Secondary | ICD-10-CM

## 2012-12-02 ENCOUNTER — Ambulatory Visit: Payer: 59 | Admitting: Family Medicine

## 2012-12-21 ENCOUNTER — Encounter: Payer: Self-pay | Admitting: Family Medicine

## 2012-12-23 ENCOUNTER — Ambulatory Visit: Payer: 59 | Admitting: Family Medicine

## 2013-01-18 ENCOUNTER — Ambulatory Visit (INDEPENDENT_AMBULATORY_CARE_PROVIDER_SITE_OTHER): Payer: 59 | Admitting: Internal Medicine

## 2013-01-18 ENCOUNTER — Encounter: Payer: Self-pay | Admitting: Internal Medicine

## 2013-01-18 VITALS — BP 124/62 | HR 72 | Temp 98.1°F | Resp 12 | Ht 69.5 in | Wt 179.0 lb

## 2013-01-18 DIAGNOSIS — E042 Nontoxic multinodular goiter: Secondary | ICD-10-CM

## 2013-01-18 NOTE — Progress Notes (Signed)
Patient ID: Michelle Pratt, female   DOB: November 16, 1955, 57 y.o.   MRN: [id number]   HPI  Michelle Pratt is a 57 y.o.-year-old female, referred by her PCP, Dr. Laury Axon, for evaluation for MNG.  She was dx with MNG in 2001 >> enlarged thyroid after a URI. She has been dx with hypothyroidism in 1998 >> initially on generic, then started on brand name. She takes in in am, with water, at least >30 min before b'fast. She takes it by itself. She takes a MVI, but with lunch.   Thyroid U/S (11/30/2012): Heterogeneous thyroid echotexture; a somewhat discreet mildly hyperechoic 6-7 mm diameter nodule is unchanged in the mid to lower right thyroid pole. A small hyperechoic round nodule measuring 5-7 mm is seen in the upper left pole. No other lesions.  Of note, patient had 8 thyroid ultrasounds between 2001 and 2014![name] She also had a thyroid uptake and scan on 07/05/1999 with normal uptake, at 26.9%, with diffusely inhomogeneous scan, consistent with multinodular goiter.  I reviewed pt's thyroid tests: Lab Results  Component Value Date   TSH 1.81 03/11/2012   TSH 1.64 03/07/2011   TSH 1.63 03/02/2010   FREET4 0.95 03/07/2011    Pt denies feeling nodules in neck, hoarseness, occasional dysphagia/no odynophagia, SOB with lying down.  Pt c/o: - hot flushes - no tremors - no anxiety/depression - no palpitations - no problems with concentration - + fatigue - no hyperdefecation/+ constipation - + weight loss - 30 lbs, hcg diet  Pt has a FH of thyroid ds in sister >> Hashimoto ds. She also had thyroidectomy, unclear if she had nodule. No FH of thyroid cancer. No h/o radiation tx to head or neck except >> she was an ERCP RN for 2-3 mo in 1997.  No seaweed or kelp, no recent contrast studies. No steroid use. No herbal supplements. Pt recently had an intentional 30 pound weight loss on the hCG diet.  ROS: Constitutional: + weight  loss, + fatigue, + subjective hyperthermia, poor sleep Eyes: + blurry vision  in the evening, no xerophthalmia ENT: no sore throat, no nodules palpated in throat, no odynophagia, no hoarseness; decreased hearing, sore throat occasional dysphagia Cardiovascular: no CP/SOB/palpitations/leg swelling Respiratory: no cough/SOB Gastrointestinal: no N/V/D/+C Musculoskeletal: no muscle/joint aches Skin: no rashes; + hair loss Neurological: no tremors/numbness/tingling/dizziness Psychiatric: no depression/anxiety  Past Medical History  Diagnosis Date  . Thyroid disease     hypo  . Menopause    Past Surgical History  Procedure Laterality Date  . Tonsillectomy    . Tubal ligation    . Oraf      left eyebrow  . Orif elbow fracture     History   Social History  . Marital Status: Married    Spouse Name: N/A    Number of Children: 3   Occupational History  . OR [employer]   Social History Main Topics  . Smoking status: Former Games developer  . Smokeless tobacco: Never Used     Comment: over 20 years ago  . Alcohol Use: 1.8 oz/week    3 Glasses of wine per week  . Drug Use: No  . Sexual Activity: Yes    Partners: Male   Social History Narrative   Exercise-- water aerobics    Caffeine Use: daily 1 cup of coffee   Current Outpatient Prescriptions on File Prior to Visit  Medication Sig Dispense Refill  . EPINEPHrine (EPIPEN JR) 0.15 MG/0.3ML injection Inject 0.15 mg into  the muscle as needed.        [name] levothyroxine (SYNTHROID, LEVOTHROID) 112 MCG tablet TAKE 1 TABLET BY MOUTH DAILY.  90 tablet  2  . venlafaxine XR (EFFEXOR-XR) 150 MG 24 hr capsule 1 cap by mouth daily--  90 capsule  3   No current facility-administered medications on file prior to visit.   Allergies  Allergen Reactions  . Bee Venom Swelling  . Morphine     REACTION: vomitting   Family History  Problem Relation Age of Onset  . Alcohol abuse Mother   . Parkinsonism Mother   . Hypertension Father   . Cancer Father 9    prostate  . Stroke Brother    PE: BP 124/62   Pulse 72  Temp(Src) 98.1 F (36.7 C) (Oral)  Resp 12  Ht 5' 9.5" (1.765 m)  Wt 179 lb (81.194 kg)  BMI 26.06 kg/m2  SpO2 97% Wt Readings from Last 3 Encounters:  01/18/13 179 lb (81.194 kg)  10/30/12 204 lb (92.534 kg)  03/11/12 198 lb 9.6 oz (90.084 kg)   Constitutional: slightly overweight, in NAD Eyes: PERRLA, EOMI, no exophthalmos ENT: moist mucous membranes, no thyromegaly, no cervical lymphadenopathy Cardiovascular: RRR, No MRG Respiratory: CTA B Gastrointestinal: abdomen soft, NT, ND, BS+ Musculoskeletal: no deformities, strength intact in all 4;  Skin: moist, warm, no rashes Neurological: no tremor with outstretched hands, DTR normal in all 4  ASSESSMENT: 1. MNG - 2 small subcentimeter thyroid nodules  PLAN: - I reviewed the images of her thyroid ultrasound along with the patient. I pointed out that the 2 nodules are small, subcm, hyperechoic, without calcifications, without internal blood flow, and well delimited from surrounding tissue. Pt does not have a thyroid cancer family history or a personal history of RxTx to head/neck. All these would favor benignity. I believe her risk of cancer is very small.  - she should let me know if she develops neck compression symptoms, or if she or her PCP starts palpating them. In that case, we might need another ultrasound, however, since she has had 8 ultrasounds already since 2001 with no concerning features, I advice her that she does not need any more ultrasounds unless she develops signs or symptoms that the nodules have enlarged - I will see her back on an as-needed basis

## 2013-01-18 NOTE — Patient Instructions (Addendum)
Please get a new thyroid ultrasound ONLY if you have signs or symptoms of increased thyroid nodules. Please return to see me as needed.

## 2013-03-18 ENCOUNTER — Other Ambulatory Visit: Payer: Self-pay

## 2013-05-17 ENCOUNTER — Encounter (HOSPITAL_COMMUNITY): Payer: Self-pay | Admitting: Emergency Medicine

## 2013-05-17 ENCOUNTER — Emergency Department (HOSPITAL_COMMUNITY)
Admission: EM | Admit: 2013-05-17 | Discharge: 2013-05-17 | Disposition: A | Discharge status: Home / Self Care | Payer: 59 | Attending: Emergency Medicine | Admitting: Emergency Medicine

## 2013-05-17 DIAGNOSIS — Z79899 Other long term (current) drug therapy: Secondary | ICD-10-CM | POA: Insufficient documentation

## 2013-05-17 DIAGNOSIS — E079 Disorder of thyroid, unspecified: Secondary | ICD-10-CM | POA: Insufficient documentation

## 2013-05-17 DIAGNOSIS — N399 Disorder of urinary system, unspecified: Secondary | ICD-10-CM

## 2013-05-17 DIAGNOSIS — Z87891 Personal history of nicotine dependence: Secondary | ICD-10-CM | POA: Insufficient documentation

## 2013-05-17 DIAGNOSIS — Z3202 Encounter for pregnancy test, result negative: Secondary | ICD-10-CM | POA: Insufficient documentation

## 2013-05-17 DIAGNOSIS — F458 Other somatoform disorders: Secondary | ICD-10-CM | POA: Insufficient documentation

## 2013-05-17 DIAGNOSIS — IMO0002 Reserved for concepts with insufficient information to code with codable children: Secondary | ICD-10-CM

## 2013-05-17 LAB — GC/CHLAMYDIA PROBE AMP
CT PROBE, AMP APTIMA: NEGATIVE
GC PROBE AMP APTIMA: NEGATIVE

## 2013-05-17 LAB — WET PREP, GENITAL
Clue Cells Wet Prep HPF POC: NONE SEEN
Trich, Wet Prep: NONE SEEN
YEAST WET PREP: NONE SEEN

## 2013-05-17 LAB — URINALYSIS, ROUTINE W REFLEX MICROSCOPIC
Bilirubin Urine: NEGATIVE
GLUCOSE, UA: NEGATIVE mg/dL
KETONES UR: NEGATIVE mg/dL
NITRITE: NEGATIVE
PH: 7 (ref 5.0–8.0)
Protein, ur: NEGATIVE mg/dL
SPECIFIC GRAVITY, URINE: 1.02 (ref 1.005–1.030)
Urobilinogen, UA: 0.2 mg/dL (ref 0.0–1.0)

## 2013-05-17 LAB — PREGNANCY, URINE: PREG TEST UR: NEGATIVE

## 2013-05-17 LAB — URINE MICROSCOPIC-ADD ON

## 2013-05-17 MED ORDER — PHENAZOPYRIDINE HCL 200 MG PO TABS: 200.0000 mg | ORAL_TABLET | Freq: Three times a day (TID) | ORAL | Status: DC

## 2013-05-17 NOTE — ED Notes (Addendum)
Pt c/o possible vaginal fistila, states has urine drip from vaginal area, no hx of same. States took a home UTI test that was positive

## 2013-05-17 NOTE — Discharge Instructions (Signed)
Please take Pyridium and use a tampon to see if it changes orange.  Please call and make an appointment to follow-up with a gynecologist.

## 2013-05-17 NOTE — ED Provider Notes (Signed)
CSN: [id number]     Arrival date & time 05/17/13  0846 History   First MD Initiated Contact with Patient 05/17/13 [phone]     Chief Complaint  Patient presents with  . Vaginal Discharge   (Consider location/radiation/quality/duration/timing/severity/associated sxs/prior Treatment) HPI  Patient to the ER with concern of urine possibly draining through her vagina. She noticed that she had some drainage a few days ago, took a UTI test which was positive and thought it might be vaginal discharge, therefore she used a douche. She then noticed that the drainage started to increase. She placed a tampon in to figure out exactly where the drainage was coming from and it was noted to be saturated with urine. She is a Engineer, civil (consulting) in the PACU here at [employer] and was aware that she may have a fistula which "is a surgical fix". Therefore she came to the ER for evaluation since is incontinent of her urine and has 3 x 12 hours shifts coming up.   Past Medical History  Diagnosis Date  . Thyroid disease     hypo  . Menopause    Past Surgical History  Procedure Laterality Date  . Tonsillectomy    . Tubal ligation    . Oraf      left eyebrow  . Orif elbow fracture     Family History  Problem Relation Age of Onset  . Alcohol abuse Mother   . Parkinsonism Mother   . Hypertension Father   . Cancer Father 9    prostate  . Stroke Brother    History  Substance Use Topics  . Smoking status: Former Games developer  . Smokeless tobacco: Never Used     Comment: over 20 years ago  . Alcohol Use: 1.8 oz/week    3 Glasses of wine per week   OB History   Grav Para Term Preterm Abortions TAB SAB Ect Mult Living                 Review of Systems The patient denies anorexia, fever, weight loss,, vision loss, decreased hearing, hoarseness, chest pain, syncope, dyspnea on exertion, peripheral edema, balance deficits, hemoptysis, abdominal pain, melena, hematochezia, severe indigestion/heartburn, hematuria,  genital sores,  muscle weakness, suspicious skin lesions, transient blindness, difficulty walking, depression, unusual weight change, abnormal bleeding, enlarged lymph nodes, angioedema, and breast masses.  Allergies  Bee venom and Morphine  Home Medications   Current Outpatient Rx  Name  Route  Sig  Dispense  Refill  . acetaminophen (TYLENOL) 500 MG tablet   Oral   Take 1,000 mg by mouth every 6 (six) hours as needed for moderate pain.         [name] EPINEPHrine (EPIPEN) 0.3 mg/0.3 mL SOAJ injection   Intramuscular   Inject 0.3 mg into the muscle once as needed (allergic reaction).         [name] levothyroxine (SYNTHROID, LEVOTHROID) 112 MCG tablet   Oral   Take 112 mcg by mouth daily before breakfast.         . naproxen sodium (ANAPROX) 220 MG tablet   Oral   Take 220 mg by mouth 2 (two) times daily as needed (pain).         . phenazopyridine (PYRIDIUM) 100 MG tablet   Oral   Take 200 mg by mouth once as needed for pain.         [name] venlafaxine XR (EFFEXOR-XR) 150 MG 24 hr capsule   Oral   Take 150 mg by mouth  every evening.         . phenazopyridine (PYRIDIUM) 200 MG tablet   Oral   Take 1 tablet (200 mg total) by mouth 3 (three) times daily.   6 tablet   0    BP 147/90  Pulse 63  Temp(Src) 97.8 F (36.6 C) (Oral)  SpO2 98% Physical Exam  Nursing note and vitals reviewed. Constitutional: She appears well-developed and well-nourished. No distress.  HENT:  Head: Normocephalic and atraumatic.  Eyes: Pupils are equal, round, and reactive to light.  Neck: Normal range of motion. Neck supple.  Cardiovascular: Normal rate and regular rhythm.   Pulmonary/Chest: Effort normal.  Abdominal: Soft.  Neurological: She is alert.  Skin: Skin is warm and dry.    ED Course  Procedures (including critical care time) Labs Review Labs Reviewed  WET PREP, GENITAL - Abnormal; Notable for the following:    WBC, Wet Prep HPF POC MANY (*)    All other components within normal limits   URINALYSIS, ROUTINE W REFLEX MICROSCOPIC - Abnormal; Notable for the following:    Hgb urine dipstick TRACE (*)    Leukocytes, UA SMALL (*)    All other components within normal limits  GC/CHLAMYDIA PROBE AMP  PREGNANCY, URINE  URINE MICROSCOPIC-ADD ON   Imaging Review No results found.  EKG Interpretation   None       MDM   1. Genitourinary disorder      Discussed case with attending Dr. Criss AlvineGoldston who recommends checking urinalysis and doing a pelvic, then consult Gynecology for further evaluation.  Patient has many WBC in wet prep. Will rx abx (flagyl) to cover for possible infection. I will have her take Pyridium as recommended by Dr. Marice Potterove, she is to place tampon in vagina and see if it turns orange.  Pt to call today and schedule an appointment with a gynecology.  58 y.o.Michelle Pratt's evaluation in the Emergency Department is complete. It has been determined that no acute conditions requiring further emergency intervention are present at this time. The patient/guardian have been advised of the diagnosis and plan. We have discussed signs and symptoms that warrant return to the ED, such as changes or worsening in symptoms.  Vital signs are stable at discharge. Filed Vitals:   05/17/13 1128  BP: 132/71  Pulse: 61  Temp:     Patient/guardian has voiced understanding and agreed to follow-up with the PCP or specialist.   Dorthula Matasiffany G Vinny Taranto, PA-C 05/17/13 1138

## 2013-05-18 NOTE — ED Provider Notes (Signed)
Medical screening examination/treatment/procedure(s) were performed by non-physician practitioner and as supervising physician I was immediately available for consultation/collaboration.  EKG Interpretation   None         Audree CamelScott T Alper Guilmette, MD 05/18/13 [phone]

## 2013-05-26 ENCOUNTER — Telehealth: Payer: Self-pay | Admitting: Family Medicine

## 2013-05-26 NOTE — Telephone Encounter (Signed)
MSG left to call the office. We do not prescribe HCG.      KP

## 2013-05-26 NOTE — Telephone Encounter (Signed)
Patient is calling to request a prescription for HCG pills to take along with a special diet she is on. Please advise.

## 2013-05-27 NOTE — Telephone Encounter (Signed)
Patient has been made aware and voiced understanding.     KP 

## 2013-05-27 NOTE — Telephone Encounter (Signed)
MSG left to call the office      KP 

## 2013-10-21 ENCOUNTER — Other Ambulatory Visit: Payer: Self-pay | Admitting: Family Medicine

## 2013-10-26 ENCOUNTER — Other Ambulatory Visit: Payer: Self-pay | Admitting: Family Medicine

## 2013-11-24 ENCOUNTER — Other Ambulatory Visit: Payer: Self-pay | Admitting: Family Medicine

## 2013-12-31 ENCOUNTER — Ambulatory Visit (INDEPENDENT_AMBULATORY_CARE_PROVIDER_SITE_OTHER): Payer: 59 | Admitting: Medical

## 2013-12-31 ENCOUNTER — Encounter: Payer: Self-pay | Admitting: Medical

## 2013-12-31 VITALS — BP 100/70 | HR 72 | Temp 98.0°F | Wt 198.2 lb

## 2013-12-31 DIAGNOSIS — R059 Cough, unspecified: Secondary | ICD-10-CM

## 2013-12-31 DIAGNOSIS — J309 Allergic rhinitis, unspecified: Secondary | ICD-10-CM

## 2013-12-31 DIAGNOSIS — Z87892 Personal history of anaphylaxis: Secondary | ICD-10-CM

## 2013-12-31 DIAGNOSIS — R05 Cough: Secondary | ICD-10-CM | POA: Insufficient documentation

## 2013-12-31 DIAGNOSIS — E039 Hypothyroidism, unspecified: Secondary | ICD-10-CM

## 2013-12-31 HISTORY — DX: Allergic rhinitis, unspecified: J30.9

## 2013-12-31 HISTORY — DX: Cough, unspecified: R05.9

## 2013-12-31 HISTORY — DX: Personal history of anaphylaxis: Z87.892

## 2013-12-31 MED ORDER — EPINEPHRINE 0.3 MG/0.3ML IJ SOAJ: 0.3000 mg | Freq: Once | INTRAMUSCULAR | Status: DC | PRN

## 2013-12-31 MED ORDER — HYDROCOD POLST-CHLORPHEN POLST 10-8 MG/5ML PO LQCR: 5.0000 mL | Freq: Two times a day (BID) | ORAL | Status: DC | PRN

## 2013-12-31 MED ORDER — ALBUTEROL SULFATE HFA 108 (90 BASE) MCG/ACT IN AERS: 2.0000 | INHALATION_SPRAY | Freq: Four times a day (QID) | RESPIRATORY_TRACT | Status: DC | PRN

## 2013-12-31 MED ORDER — FLUTICASONE PROPIONATE 50 MCG/ACT NA SUSP: 2.0000 | Freq: Every day | NASAL | Status: DC

## 2013-12-31 NOTE — Assessment & Plan Note (Signed)
Tussionex but if coughing a lot despite tussionex  then could  try albuterol for severe dry cough or if any wheezing. Advised if worsening signs or symptoms as discussed then notify me by Monday and would give antibiotic. Not appearing to be indicated presently.

## 2013-12-31 NOTE — Assessment & Plan Note (Signed)
Fluticasone nasal spray

## 2013-12-31 NOTE — Progress Notes (Signed)
   Subjective:    Patient ID: Michelle Pratt, female    DOB: 05/03/1956, 58 y.o.   MRN: [id number]  HPI   Pt since Sunday had sore throat. Now has dry cough. Hard to sleep due to cough. Some post nasal drainage. Faint nasal congestion.No sneezing.No fever, no chills. No sweats. No history of allergies this time of the year. Stopped smoking 18 yrs ago. Smoked for 15 yrs before she quit 1/2 pack per day. No hx of inhaler use.   Pt also has hypothyroid history. Last checked in 2013.   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 Her epipen is expired.    Review of Systems  Constitutional: Negative for fever, chills and fatigue.  HENT: Positive for congestion, postnasal drip and sore throat. Negative for ear discharge, ear pain, hearing loss, nosebleeds, rhinorrhea, sinus pressure, sneezing, tinnitus and trouble swallowing.   Respiratory: Positive for cough. Negative for choking, shortness of breath and wheezing.   Cardiovascular: Negative for chest pain and palpitations.  Musculoskeletal: Negative.   Skin: Negative for rash.  Neurological: Negative.   Hematological: Positive for adenopathy. Does not bruise/bleed easily.       She feels like submandibular lymph nodes mild swollen.       Objective:   Physical Exam  General  Mental Status - Alert. General Appearance - Well groomed. Not in acute distress.  Skin Rashes- No Rashes.  HEENT Head- Normal. Ear Auditory Canal - Left- Normal. Right - Normal.Tympanic Membrane- Left- Normal. Right- Normal. Eye Sclera/Conjunctiva- Left- Normal. Right- Normal. Nose & Sinuses Nasal Mucosa- Left- Boggy o+ Congested. Right-  Boggy + Congested. Sinuses nontender Mouth & Throat Lips: Upper Lip- Normal: no dryness, cracking, pallor, cyanosis, or vesicular eruption. Lower Lip-Normal: no dryness, cracking, pallor, cyanosis or vesicular eruption. Buccal Mucosa- Bilateral- No Aphthous ulcers. Oropharynx- No Discharge or Erythema. Tonsils:  Characteristics- Bilateral- No Erythema or Congestion. pnd. Size/Enlargement- Bilateral- No enlargement. Discharge- bilateral-None.  Neck Neck- Supple. No Masses.   Chest and Lung Exam Auscultation: Breath Sounds:-Normal.  Cardiovascular Auscultation:Rythm- Regular.  Murmurs & Other Heart Sounds:Ausculatation of the heart reveal- No Murmurs.  Lymphatic Head & Neck General Head & Neck Lymphatics: Bilateral: Description- No Localized lymphadenopathy.        Assessment & Plan:

## 2013-12-31 NOTE — Patient Instructions (Signed)
Appears to have started with some allergic rhinitis and will prescribe fluticasone. For your cough will prescribe tussionex. Based on your history of smoking and mild low O2% I am writing albuterol inhaler since your aggressive cough at night might represent airway tightening. If your symptoms worsen fever, sinus pressure etc then notify me by Monday and will prescribe antiobiotic. Folllowup in 7 days or as needed.

## 2013-12-31 NOTE — Progress Notes (Signed)
Pre visit review using our clinic review tool, if applicable. No additional management support is needed unless otherwise documented below in the visit note. 

## 2013-12-31 NOTE — Assessment & Plan Note (Signed)
tsh today and then refill medication.

## 2013-12-31 NOTE — Assessment & Plan Note (Signed)
Refilled her epipen for anaphylaxis to stings(bee).

## 2014-01-01 LAB — TSH: TSH: 2.36 u[IU]/mL (ref 0.35–4.50)

## 2014-01-03 ENCOUNTER — Telehealth: Payer: Self-pay | Admitting: Family Medicine

## 2014-01-03 MED ORDER — LEVOTHYROXINE SODIUM 112 MCG PO TABS: ORAL_TABLET | ORAL | Status: DC

## 2014-01-03 NOTE — Telephone Encounter (Signed)
Caller name: [place] Relation to pt: Call back number:[phone] Pharmacy: [facility]  Reason for call:  Pt was seen on 8/21 by Ramon Dredge and is not feeling better.  Wants to get an antibiotic called in.  Thanks.

## 2014-01-03 NOTE — Addendum Note (Signed)
Addended by: Eustace Quail on: 01/03/2014 01:18 PM   Modules accepted: Orders

## 2014-01-03 NOTE — Telephone Encounter (Signed)
Pt states still has same symptoms as last visit , not improving.

## 2014-01-04 ENCOUNTER — Telehealth: Payer: Self-pay

## 2014-01-04 MED ORDER — CEFDINIR 300 MG PO CAPS: 300.0000 mg | ORAL_CAPSULE | Freq: Two times a day (BID) | ORAL | Status: DC

## 2014-01-04 NOTE — Telephone Encounter (Signed)
Can send her in cefdinir 300 mg #20 1 tab po bid x 10 days. If not improved some before Friday let us know.

## 2014-01-04 NOTE — Telephone Encounter (Signed)
rx sent  Pt notified. 

## 2014-01-04 NOTE — Telephone Encounter (Signed)
Pt called to f/u symptoms not improving from prior visit 12/31/13 with Dahlia Client. Pharmacy never received antibiotics please send rx to [facility] [phone]

## 2014-01-04 NOTE — Telephone Encounter (Signed)
Michelle Pratt  [phone] [facility] She called yesterday and again this morning, she is not any better, and she said that Ramon Dredge advise her to call on Monday if she was not better and he would call an antibiotic in for her.

## 2014-02-23 ENCOUNTER — Other Ambulatory Visit: Payer: Self-pay | Admitting: Family Medicine

## 2014-02-24 NOTE — Telephone Encounter (Signed)
Rx sent to the pharmacy by e-script.//AB/CMA 

## 2014-02-25 ENCOUNTER — Other Ambulatory Visit: Payer: Self-pay

## 2014-03-18 ENCOUNTER — Encounter: Payer: 59 | Admitting: Family Medicine

## 2014-04-11 ENCOUNTER — Other Ambulatory Visit: Payer: Self-pay | Admitting: Family Medicine

## 2014-04-11 ENCOUNTER — Ambulatory Visit (INDEPENDENT_AMBULATORY_CARE_PROVIDER_SITE_OTHER): Payer: 59 | Admitting: Family Medicine

## 2014-04-11 ENCOUNTER — Encounter: Payer: Self-pay | Admitting: Family Medicine

## 2014-04-11 VITALS — BP 108/70 | HR 61 | Temp 98.0°F | Ht 70.0 in | Wt 199.4 lb

## 2014-04-11 DIAGNOSIS — E039 Hypothyroidism, unspecified: Secondary | ICD-10-CM

## 2014-04-11 DIAGNOSIS — Z8249 Family history of ischemic heart disease and other diseases of the circulatory system: Secondary | ICD-10-CM

## 2014-04-11 DIAGNOSIS — Z0000 Encounter for general adult medical examination without abnormal findings: Secondary | ICD-10-CM

## 2014-04-11 DIAGNOSIS — Z1239 Encounter for other screening for malignant neoplasm of breast: Secondary | ICD-10-CM

## 2014-04-11 DIAGNOSIS — N951 Menopausal and female climacteric states: Secondary | ICD-10-CM

## 2014-04-11 LAB — CBC WITH DIFFERENTIAL/PLATELET
BASOS ABS: 0 10*3/uL (ref 0.0–0.1)
BASOS PCT: 0.8 % (ref 0.0–3.0)
EOS ABS: 0.1 10*3/uL (ref 0.0–0.7)
Eosinophils Relative: 2.5 % (ref 0.0–5.0)
HCT: 44 % (ref 36.0–46.0)
Hemoglobin: 14.4 g/dL (ref 12.0–15.0)
LYMPHS PCT: 35.6 % (ref 12.0–46.0)
Lymphs Abs: 1.8 10*3/uL (ref 0.7–4.0)
MCHC: 32.8 g/dL (ref 30.0–36.0)
MCV: 92 fl (ref 78.0–100.0)
MONO ABS: 0.4 10*3/uL (ref 0.1–1.0)
Monocytes Relative: 7.4 % (ref 3.0–12.0)
Neutro Abs: 2.7 10*3/uL (ref 1.4–7.7)
Neutrophils Relative %: 53.7 % (ref 43.0–77.0)
Platelets: 290 10*3/uL (ref 150.0–400.0)
RBC: 4.79 Mil/uL (ref 3.87–5.11)
RDW: 12.9 % (ref 11.5–15.5)
WBC: 5.1 10*3/uL (ref 4.0–10.5)

## 2014-04-11 LAB — LIPID PANEL
Cholesterol: 219 mg/dL — ABNORMAL HIGH (ref 0–200)
HDL: 58 mg/dL (ref >39.00)
LDL Cholesterol: 135 mg/dL — ABNORMAL HIGH (ref 0–99)
NonHDL: 161
TRIGLYCERIDES: 128 mg/dL (ref 0.0–149.0)
Total CHOL/HDL Ratio: 4
VLDL: 25.6 mg/dL (ref 0.0–40.0)

## 2014-04-11 LAB — BASIC METABOLIC PANEL
BUN: 16 mg/dL (ref 6–23)
CALCIUM: 8.9 mg/dL (ref 8.4–10.5)
CHLORIDE: 102 meq/L (ref 96–112)
CO2: 26 meq/L (ref 19–32)
Creatinine, Ser: 0.7 mg/dL (ref 0.4–1.2)
GFR: 85.78 mL/min (ref >60.00)
GLUCOSE: 100 mg/dL — AB (ref 70–99)
Potassium: 4 mEq/L (ref 3.5–5.1)
Sodium: 136 mEq/L (ref 135–145)

## 2014-04-11 LAB — POCT URINALYSIS DIPSTICK
BILIRUBIN UA: NEGATIVE
Glucose, UA: NEGATIVE
Ketones, UA: NEGATIVE
Leukocytes, UA: NEGATIVE
Nitrite, UA: NEGATIVE
PH UA: 5.5
Protein, UA: NEGATIVE
RBC UA: NEGATIVE
Urobilinogen, UA: NEGATIVE

## 2014-04-11 LAB — HEPATIC FUNCTION PANEL
ALBUMIN: 4.3 g/dL (ref 3.5–5.2)
ALK PHOS: 64 U/L (ref 39–117)
ALT: 23 U/L (ref 0–35)
AST: 18 U/L (ref 0–37)
Bilirubin, Direct: 0 mg/dL (ref 0.0–0.3)
TOTAL PROTEIN: 6.7 g/dL (ref 6.0–8.3)
Total Bilirubin: 0.6 mg/dL (ref 0.2–1.2)

## 2014-04-11 LAB — TSH: TSH: 1.48 u[IU]/mL (ref 0.35–4.50)

## 2014-04-11 MED ORDER — VENLAFAXINE HCL ER 150 MG PO CP24: ORAL_CAPSULE | ORAL | Status: DC

## 2014-04-11 NOTE — Patient Instructions (Signed)
Preventive Care for Adults A healthy lifestyle and preventive care can promote health and wellness. Preventive health guidelines for women include the following key practices.  A routine yearly physical is a good way to check with your health care provider about your health and preventive screening. It is a chance to share any concerns and updates on your health and to receive a thorough exam.  Visit your dentist for a routine exam and preventive care every 6 months. Brush your teeth twice a day and floss once a day. Good oral hygiene prevents tooth decay and gum disease.  The frequency of eye exams is based on your age, health, family medical history, use of contact lenses, and other factors. Follow your health care provider's recommendations for frequency of eye exams.  Eat a healthy diet. Foods like vegetables, fruits, whole grains, low-fat dairy products, and lean protein foods contain the nutrients you need without too many calories. Decrease your intake of foods high in solid fats, added sugars, and salt. Eat the right amount of calories for you.Get information about a proper diet from your health care provider, if necessary.  Regular physical exercise is one of the most important things you can do for your health. Most adults should get at least 150 minutes of moderate-intensity exercise (any activity that increases your heart rate and causes you to sweat) each week. In addition, most adults need muscle-strengthening exercises on 2 or more days a week.  Maintain a healthy weight. The body mass index (BMI) is a screening tool to identify possible weight problems. It provides an estimate of body fat based on height and weight. Your health care provider can find your BMI and can help you achieve or maintain a healthy weight.For adults 20 years and older:  A BMI below 18.5 is considered underweight.  A BMI of 18.5 to 24.9 is normal.  A BMI of 25 to 29.9 is considered overweight.  A BMI of  30 and above is considered obese.  Maintain normal blood lipids and cholesterol levels by exercising and minimizing your intake of saturated fat. Eat a balanced diet with plenty of fruit and vegetables. Blood tests for lipids and cholesterol should begin at age 76 and be repeated every 5 years. If your lipid or cholesterol levels are high, you are over 50, or you are at high risk for heart disease, you may need your cholesterol levels checked more frequently.Ongoing high lipid and cholesterol levels should be treated with medicines if diet and exercise are not working.  If you smoke, find out from your health care provider how to quit. If you do not use tobacco, do not start.  Lung cancer screening is recommended for adults aged 22-80 years who are at high risk for developing lung cancer because of a history of smoking. A yearly low-dose CT scan of the lungs is recommended for people who have at least a 30-pack-year history of smoking and are a current smoker or have quit within the past 15 years. A pack year of smoking is smoking an average of 1 pack of cigarettes a day for 1 year (for example: 1 pack a day for 30 years or 2 packs a day for 15 years). Yearly screening should continue until the smoker has stopped smoking for at least 15 years. Yearly screening should be stopped for people who develop a health problem that would prevent them from having lung cancer treatment.  If you are pregnant, do not drink alcohol. If you are breastfeeding,  be very cautious about drinking alcohol. If you are not pregnant and choose to drink alcohol, do not have more than 1 drink per day. One drink is considered to be 12 ounces (355 mL) of beer, 5 ounces (148 mL) of wine, or 1.5 ounces (44 mL) of liquor.  Avoid use of street drugs. Do not share needles with anyone. Ask for help if you need support or instructions about stopping the use of drugs.  High blood pressure causes heart disease and increases the risk of  stroke. Your blood pressure should be checked at least every 1 to 2 years. Ongoing high blood pressure should be treated with medicines if weight loss and exercise do not work.  If you are 75-52 years old, ask your health care provider if you should take aspirin to prevent strokes.  Diabetes screening involves taking a blood sample to check your fasting blood sugar level. This should be done once every 3 years, after age 15, if you are within normal weight and without risk factors for diabetes. Testing should be considered at a younger age or be carried out more frequently if you are overweight and have at least 1 risk factor for diabetes.  Breast cancer screening is essential preventive care for women. You should practice "breast self-awareness." This means understanding the normal appearance and feel of your breasts and may include breast self-examination. Any changes detected, no matter how small, should be reported to a health care provider. Women in their 58s and 30s should have a clinical breast exam (CBE) by a health care provider as part of a regular health exam every 1 to 3 years. After age 16, women should have a CBE every year. Starting at age 53, women should consider having a mammogram (breast X-ray test) every year. Women who have a family history of breast cancer should talk to their health care provider about genetic screening. Women at a high risk of breast cancer should talk to their health care providers about having an MRI and a mammogram every year.  Breast cancer gene (BRCA)-related cancer risk assessment is recommended for women who have family members with BRCA-related cancers. BRCA-related cancers include breast, ovarian, tubal, and peritoneal cancers. Having family members with these cancers may be associated with an increased risk for harmful changes (mutations) in the breast cancer genes BRCA1 and BRCA2. Results of the assessment will determine the need for genetic counseling and  BRCA1 and BRCA2 testing.  Routine pelvic exams to screen for cancer are no longer recommended for nonpregnant women who are considered low risk for cancer of the pelvic organs (ovaries, uterus, and vagina) and who do not have symptoms. Ask your health care provider if a screening pelvic exam is right for you.  If you have had past treatment for cervical cancer or a condition that could lead to cancer, you need Pap tests and screening for cancer for at least 20 years after your treatment. If Pap tests have been discontinued, your risk factors (such as having a new sexual partner) need to be reassessed to determine if screening should be resumed. Some women have medical problems that increase the chance of getting cervical cancer. In these cases, your health care provider may recommend more frequent screening and Pap tests.  The HPV test is an additional test that may be used for cervical cancer screening. The HPV test looks for the virus that can cause the cell changes on the cervix. The cells collected during the Pap test can be  tested for HPV. The HPV test could be used to screen women aged 30 years and older, and should be used in women of any age who have unclear Pap test results. After the age of 30, women should have HPV testing at the same frequency as a Pap test.  Colorectal cancer can be detected and often prevented. Most routine colorectal cancer screening begins at the age of 50 years and continues through age 75 years. However, your health care provider may recommend screening at an earlier age if you have risk factors for colon cancer. On a yearly basis, your health care provider may provide home test kits to check for hidden blood in the stool. Use of a small camera at the end of a tube, to directly examine the colon (sigmoidoscopy or colonoscopy), can detect the earliest forms of colorectal cancer. Talk to your health care provider about this at age 50, when routine screening begins. Direct  exam of the colon should be repeated every 5-10 years through age 75 years, unless early forms of pre-cancerous polyps or small growths are found.  People who are at an increased risk for hepatitis B should be screened for this virus. You are considered at high risk for hepatitis B if:  You were born in a country where hepatitis B occurs often. Talk with your health care provider about which countries are considered high risk.  Your parents were born in a high-risk country and you have not received a shot to protect against hepatitis B (hepatitis B vaccine).  You have HIV or AIDS.  You use needles to inject street drugs.  You live with, or have sex with, someone who has hepatitis B.  You get hemodialysis treatment.  You take certain medicines for conditions like cancer, organ transplantation, and autoimmune conditions.  Hepatitis C blood testing is recommended for all people born from 1945 through 1965 and any individual with known risks for hepatitis C.  Practice safe sex. Use condoms and avoid high-risk sexual practices to reduce the spread of sexually transmitted infections (STIs). STIs include gonorrhea, chlamydia, syphilis, trichomonas, herpes, HPV, and human immunodeficiency virus (HIV). Herpes, HIV, and HPV are viral illnesses that have no cure. They can result in disability, cancer, and death.  You should be screened for sexually transmitted illnesses (STIs) including gonorrhea and chlamydia if:  You are sexually active and are younger than 24 years.  You are older than 24 years and your health care provider tells you that you are at risk for this type of infection.  Your sexual activity has changed since you were last screened and you are at an increased risk for chlamydia or gonorrhea. Ask your health care provider if you are at risk.  If you are at risk of being infected with HIV, it is recommended that you take a prescription medicine daily to prevent HIV infection. This is  called preexposure prophylaxis (PrEP). You are considered at risk if:  You are a heterosexual woman, are sexually active, and are at increased risk for HIV infection.  You take drugs by injection.  You are sexually active with a partner who has HIV.  Talk with your health care provider about whether you are at high risk of being infected with HIV. If you choose to begin PrEP, you should first be tested for HIV. You should then be tested every 3 months for as long as you are taking PrEP.  Osteoporosis is a disease in which the bones lose minerals and strength   with aging. This can result in serious bone fractures or breaks. The risk of osteoporosis can be identified using a bone density scan. Women ages 65 years and over and women at risk for fractures or osteoporosis should discuss screening with their health care providers. Ask your health care provider whether you should take a calcium supplement or vitamin D to reduce the rate of osteoporosis.  Menopause can be associated with physical symptoms and risks. Hormone replacement therapy is available to decrease symptoms and risks. You should talk to your health care provider about whether hormone replacement therapy is right for you.  Use sunscreen. Apply sunscreen liberally and repeatedly throughout the day. You should seek shade when your shadow is shorter than you. Protect yourself by wearing long sleeves, pants, a wide-brimmed hat, and sunglasses year round, whenever you are outdoors.  Once a month, do a whole body skin exam, using a mirror to look at the skin on your back. Tell your health care provider of new moles, moles that have irregular borders, moles that are larger than a pencil eraser, or moles that have changed in shape or color.  Stay current with required vaccines (immunizations).  Influenza vaccine. All adults should be immunized every year.  Tetanus, diphtheria, and acellular pertussis (Td, Tdap) vaccine. Pregnant women should  receive 1 dose of Tdap vaccine during each pregnancy. The dose should be obtained regardless of the length of time since the last dose. Immunization is preferred during the 27th-36th week of gestation. An adult who has not previously received Tdap or who does not know her vaccine status should receive 1 dose of Tdap. This initial dose should be followed by tetanus and diphtheria toxoids (Td) booster doses every 10 years. Adults with an unknown or incomplete history of completing a 3-dose immunization series with Td-containing vaccines should begin or complete a primary immunization series including a Tdap dose. Adults should receive a Td booster every 10 years.  Varicella vaccine. An adult without evidence of immunity to varicella should receive 2 doses or a second dose if she has previously received 1 dose. Pregnant females who do not have evidence of immunity should receive the first dose after pregnancy. This first dose should be obtained before leaving the health care facility. The second dose should be obtained 4-8 weeks after the first dose.  Human papillomavirus (HPV) vaccine. Females aged 13-26 years who have not received the vaccine previously should obtain the 3-dose series. The vaccine is not recommended for use in pregnant females. However, pregnancy testing is not needed before receiving a dose. If a female is found to be pregnant after receiving a dose, no treatment is needed. In that case, the remaining doses should be delayed until after the pregnancy. Immunization is recommended for any person with an immunocompromised condition through the age of 26 years if she did not get any or all doses earlier. During the 3-dose series, the second dose should be obtained 4-8 weeks after the first dose. The third dose should be obtained 24 weeks after the first dose and 16 weeks after the second dose.  Zoster vaccine. One dose is recommended for adults aged 60 years or older unless certain conditions are  present.  Measles, mumps, and rubella (MMR) vaccine. Adults born before 1957 generally are considered immune to measles and mumps. Adults born in 1957 or later should have 1 or more doses of MMR vaccine unless there is a contraindication to the vaccine or there is laboratory evidence of immunity to   each of the three diseases. A routine second dose of MMR vaccine should be obtained at least 28 days after the first dose for students attending postsecondary schools, health care workers, or international travelers. People who received inactivated measles vaccine or an unknown type of measles vaccine during 1963-1967 should receive 2 doses of MMR vaccine. People who received inactivated mumps vaccine or an unknown type of mumps vaccine before 1979 and are at high risk for mumps infection should consider immunization with 2 doses of MMR vaccine. For females of childbearing age, rubella immunity should be determined. If there is no evidence of immunity, females who are not pregnant should be vaccinated. If there is no evidence of immunity, females who are pregnant should delay immunization until after pregnancy. Unvaccinated health care workers born before 1957 who lack laboratory evidence of measles, mumps, or rubella immunity or laboratory confirmation of disease should consider measles and mumps immunization with 2 doses of MMR vaccine or rubella immunization with 1 dose of MMR vaccine.  Pneumococcal 13-valent conjugate (PCV13) vaccine. When indicated, a person who is uncertain of her immunization history and has no record of immunization should receive the PCV13 vaccine. An adult aged 19 years or older who has certain medical conditions and has not been previously immunized should receive 1 dose of PCV13 vaccine. This PCV13 should be followed with a dose of pneumococcal polysaccharide (PPSV23) vaccine. The PPSV23 vaccine dose should be obtained at least 8 weeks after the dose of PCV13 vaccine. An adult aged 19  years or older who has certain medical conditions and previously received 1 or more doses of PPSV23 vaccine should receive 1 dose of PCV13. The PCV13 vaccine dose should be obtained 1 or more years after the last PPSV23 vaccine dose.  Pneumococcal polysaccharide (PPSV23) vaccine. When PCV13 is also indicated, PCV13 should be obtained first. All adults aged 65 years and older should be immunized. An adult younger than age 65 years who has certain medical conditions should be immunized. Any person who resides in a nursing home or long-term care facility should be immunized. An adult smoker should be immunized. People with an immunocompromised condition and certain other conditions should receive both PCV13 and PPSV23 vaccines. People with human immunodeficiency virus (HIV) infection should be immunized as soon as possible after diagnosis. Immunization during chemotherapy or radiation therapy should be avoided. Routine use of PPSV23 vaccine is not recommended for American Indians, [place] Natives, or people younger than 65 years unless there are medical conditions that require PPSV23 vaccine. When indicated, people who have unknown immunization and have no record of immunization should receive PPSV23 vaccine. One-time revaccination 5 years after the first dose of PPSV23 is recommended for people aged 19-64 years who have chronic kidney failure, nephrotic syndrome, asplenia, or immunocompromised conditions. People who received 1-2 doses of PPSV23 before age 65 years should receive another dose of PPSV23 vaccine at age 65 years or later if at least 5 years have passed since the previous dose. Doses of PPSV23 are not needed for people immunized with PPSV23 at or after age 65 years.  Meningococcal vaccine. Adults with asplenia or persistent complement component deficiencies should receive 2 doses of quadrivalent meningococcal conjugate (MenACWY-D) vaccine. The doses should be obtained at least 2 months apart.  Microbiologists working with certain meningococcal bacteria, military recruits, people at risk during an outbreak, and people who travel to or live in countries with a high rate of meningitis should be immunized. A first-year college student up through age   21 years who is living in a residence hall should receive a dose if she did not receive a dose on or after her 16th birthday. Adults who have certain high-risk conditions should receive one or more doses of vaccine.  Hepatitis A vaccine. Adults who wish to be protected from this disease, have certain high-risk conditions, work with hepatitis A-infected animals, work in hepatitis A research labs, or travel to or work in countries with a high rate of hepatitis A should be immunized. Adults who were previously unvaccinated and who anticipate close contact with an international adoptee during the first 60 days after arrival in the [place] from a country with a high rate of hepatitis A should be immunized.  Hepatitis B vaccine. Adults who wish to be protected from this disease, have certain high-risk conditions, may be exposed to blood or other infectious body fluids, are household contacts or sex partners of hepatitis B positive people, are clients or workers in certain care facilities, or travel to or work in countries with a high rate of hepatitis B should be immunized.  Haemophilus influenzae type b (Hib) vaccine. A previously unvaccinated person with asplenia or sickle cell disease or having a scheduled splenectomy should receive 1 dose of Hib vaccine. Regardless of previous immunization, a recipient of a hematopoietic stem cell transplant should receive a 3-dose series 6-12 months after her successful transplant. Hib vaccine is not recommended for adults with HIV infection. Preventive Services / Frequency Ages 64 to 68 years  Blood pressure check.** / Every 1 to 2 years.  Lipid and cholesterol check.** / Every 5 years beginning at age  22.  Clinical breast exam.** / Every 3 years for women in their 88s and 53s.  BRCA-related cancer risk assessment.** / For women who have family members with a BRCA-related cancer (breast, ovarian, tubal, or peritoneal cancers).  Pap test.** / Every 2 years from ages 90 through 51. Every 3 years starting at age 21 through age 56 or 3 with a history of 3 consecutive normal Pap tests.  HPV screening.** / Every 3 years from ages 24 through ages 1 to 46 with a history of 3 consecutive normal Pap tests.  Hepatitis C blood test.** / For any individual with known risks for hepatitis C.  Skin self-exam. / Monthly.  Influenza vaccine. / Every year.  Tetanus, diphtheria, and acellular pertussis (Tdap, Td) vaccine.** / Consult your health care provider. Pregnant women should receive 1 dose of Tdap vaccine during each pregnancy. 1 dose of Td every 10 years.  Varicella vaccine.** / Consult your health care provider. Pregnant females who do not have evidence of immunity should receive the first dose after pregnancy.  HPV vaccine. / 3 doses over 6 months, if 72 and younger. The vaccine is not recommended for use in pregnant females. However, pregnancy testing is not needed before receiving a dose.  Measles, mumps, rubella (MMR) vaccine.** / You need at least 1 dose of MMR if you were born in 1957 or later. You may also need a 2nd dose. For females of childbearing age, rubella immunity should be determined. If there is no evidence of immunity, females who are not pregnant should be vaccinated. If there is no evidence of immunity, females who are pregnant should delay immunization until after pregnancy.  Pneumococcal 13-valent conjugate (PCV13) vaccine.** / Consult your health care provider.  Pneumococcal polysaccharide (PPSV23) vaccine.** / 1 to 2 doses if you smoke cigarettes or if you have certain conditions.  Meningococcal vaccine.** /  1 dose if you are age 19 to 21 years and a first-year college  student living in a residence hall, or have one of several medical conditions, you need to get vaccinated against meningococcal disease. You may also need additional booster doses.  Hepatitis A vaccine.** / Consult your health care provider.  Hepatitis B vaccine.** / Consult your health care provider.  Haemophilus influenzae type b (Hib) vaccine.** / Consult your health care provider. Ages 40 to 64 years  Blood pressure check.** / Every 1 to 2 years.  Lipid and cholesterol check.** / Every 5 years beginning at age 20 years.  Lung cancer screening. / Every year if you are aged 55-80 years and have a 30-pack-year history of smoking and currently smoke or have quit within the past 15 years. Yearly screening is stopped once you have quit smoking for at least 15 years or develop a health problem that would prevent you from having lung cancer treatment.  Clinical breast exam.** / Every year after age 40 years.  BRCA-related cancer risk assessment.** / For women who have family members with a BRCA-related cancer (breast, ovarian, tubal, or peritoneal cancers).  Mammogram.** / Every year beginning at age 40 years and continuing for as long as you are in good health. Consult with your health care provider.  Pap test.** / Every 3 years starting at age 30 years through age 65 or 70 years with a history of 3 consecutive normal Pap tests.  HPV screening.** / Every 3 years from ages 30 years through ages 65 to 70 years with a history of 3 consecutive normal Pap tests.  Fecal occult blood test (FOBT) of stool. / Every year beginning at age 50 years and continuing until age 75 years. You may not need to do this test if you get a colonoscopy every 10 years.  Flexible sigmoidoscopy or colonoscopy.** / Every 5 years for a flexible sigmoidoscopy or every 10 years for a colonoscopy beginning at age 50 years and continuing until age 75 years.  Hepatitis C blood test.** / For all people born from 1945 through  1965 and any individual with known risks for hepatitis C.  Skin self-exam. / Monthly.  Influenza vaccine. / Every year.  Tetanus, diphtheria, and acellular pertussis (Tdap/Td) vaccine.** / Consult your health care provider. Pregnant women should receive 1 dose of Tdap vaccine during each pregnancy. 1 dose of Td every 10 years.  Varicella vaccine.** / Consult your health care provider. Pregnant females who do not have evidence of immunity should receive the first dose after pregnancy.  Zoster vaccine.** / 1 dose for adults aged 60 years or older.  Measles, mumps, rubella (MMR) vaccine.** / You need at least 1 dose of MMR if you were born in 1957 or later. You may also need a 2nd dose. For females of childbearing age, rubella immunity should be determined. If there is no evidence of immunity, females who are not pregnant should be vaccinated. If there is no evidence of immunity, females who are pregnant should delay immunization until after pregnancy.  Pneumococcal 13-valent conjugate (PCV13) vaccine.** / Consult your health care provider.  Pneumococcal polysaccharide (PPSV23) vaccine.** / 1 to 2 doses if you smoke cigarettes or if you have certain conditions.  Meningococcal vaccine.** / Consult your health care provider.  Hepatitis A vaccine.** / Consult your health care provider.  Hepatitis B vaccine.** / Consult your health care provider.  Haemophilus influenzae type b (Hib) vaccine.** / Consult your health care provider. Ages 65   years and over  Blood pressure check.** / Every 1 to 2 years.  Lipid and cholesterol check.** / Every 5 years beginning at age 22 years.  Lung cancer screening. / Every year if you are aged 73-80 years and have a 30-pack-year history of smoking and currently smoke or have quit within the past 15 years. Yearly screening is stopped once you have quit smoking for at least 15 years or develop a health problem that would prevent you from having lung cancer  treatment.  Clinical breast exam.** / Every year after age 4 years.  BRCA-related cancer risk assessment.** / For women who have family members with a BRCA-related cancer (breast, ovarian, tubal, or peritoneal cancers).  Mammogram.** / Every year beginning at age 40 years and continuing for as long as you are in good health. Consult with your health care provider.  Pap test.** / Every 3 years starting at age 9 years through age 34 or 91 years with 3 consecutive normal Pap tests. Testing can be stopped between 65 and 70 years with 3 consecutive normal Pap tests and no abnormal Pap or HPV tests in the past 10 years.  HPV screening.** / Every 3 years from ages 57 years through ages 64 or 45 years with a history of 3 consecutive normal Pap tests. Testing can be stopped between 65 and 70 years with 3 consecutive normal Pap tests and no abnormal Pap or HPV tests in the past 10 years.  Fecal occult blood test (FOBT) of stool. / Every year beginning at age 15 years and continuing until age 17 years. You may not need to do this test if you get a colonoscopy every 10 years.  Flexible sigmoidoscopy or colonoscopy.** / Every 5 years for a flexible sigmoidoscopy or every 10 years for a colonoscopy beginning at age 86 years and continuing until age 71 years.  Hepatitis C blood test.** / For all people born from [birth year] through 1964 and any individual with known risks for hepatitis C.  Osteoporosis screening.** / A one-time screening for women ages 83 years and over and women at risk for fractures or osteoporosis.  Skin self-exam. / Monthly.  Influenza vaccine. / Every year.  Tetanus, diphtheria, and acellular pertussis (Tdap/Td) vaccine.** / 1 dose of Td every 10 years.  Varicella vaccine.** / Consult your health care provider.  Zoster vaccine.** / 1 dose for adults aged 61 years or older.  Pneumococcal 13-valent conjugate (PCV13) vaccine.** / Consult your health care provider.  Pneumococcal  polysaccharide (PPSV23) vaccine.** / 1 dose for all adults aged 28 years and older.  Meningococcal vaccine.** / Consult your health care provider.  Hepatitis A vaccine.** / Consult your health care provider.  Hepatitis B vaccine.** / Consult your health care provider.  Haemophilus influenzae type b (Hib) vaccine.** / Consult your health care provider. ** Family history and personal history of risk and conditions may change your health care provider's recommendations. Document Released: 06/25/2001 Document Revised: 09/13/2013 Document Reviewed: 09/24/2010 [facility] Patient Information 2015 [place], [place]. This information is not intended to replace advice given to you by your health care provider. Make sure you discuss any questions you have with your health care provider.

## 2014-04-11 NOTE — Progress Notes (Signed)
Subjective:     Michelle Pratt is a 58 y.o. female and is here for a comprehensive physical exam. The patient reports no problems.  History   Social History  . Marital Status: Married    Spouse Name: N/A    Number of Children: N/A  . 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 Smoking status: Former Games developermoker  . Smokeless tobacco: Never Used     Comment: over 20 years ago  . Alcohol Use: 1.8 oz/week    3 Glasses of wine per week  . Drug Use: No  . Sexual Activity:    Partners: Male   Other Topics Concern  . Not on file   Social History Narrative   Exercise-- water aerobics    Caffeine Use: daily 1 cup of coffee   Health Maintenance  Topic Date Due  . MAMMOGRAM  05/20/2013  . INFLUENZA VACCINE  12/12/2014  . PAP SMEAR  03/12/2015  . TETANUS/TDAP  10/27/2018  . COLONOSCOPY  11/04/2020    The following portions of the patient's history were reviewed and updated as appropriate:  She  has a past medical history of Thyroid disease and Menopause. She  does not have any pertinent problems on file. She  has past surgical history that includes Tonsillectomy; Tubal ligation; oraf; and ORIF elbow fracture. Her family history includes Alcohol abuse in her mother; Cancer (age of onset: 5589) in her father; Hypertension in her father; Parkinsonism in her mother; Stroke in her brother. She  reports that she has quit smoking. She has never used smokeless tobacco. She reports that she drinks about 1.8 oz of alcohol per week. She reports that she does not use illicit drugs. She has a current medication list which includes the following prescription(s): epinephrine, levothyroxine, and venlafaxine xr. Current Outpatient Prescriptions on File Prior to Visit  Medication Sig Dispense Refill  . EPINEPHrine (EPIPEN) 0.3 mg/0.3 mL IJ SOAJ injection Inject 0.3 mLs (0.3 mg total) into the muscle once as needed (allergic reaction).  1 Device 0  . levothyroxine (SYNTHROID, LEVOTHROID) 112 MCG tablet TAKE 1 TABLET BY MOUTH ONCE DAILY 90 tablet 0   No current facility-administered medications on file prior to visit.   She is allergic to bee venom and morphine..  Review of Systems Review of Systems  Constitutional: Negative for activity change, appetite change and fatigue.  HENT: Negative for hearing loss, congestion, tinnitus and ear discharge.  dentist [id number] Eyes: Negative for visual disturbance (see optho q1y -- vision corrected to 20/20 with glasses).  Respiratory: Negative for cough, chest tightness and shortness of breath.   Cardiovascular: Negative for chest pain, palpitations and leg swelling.  Gastrointestinal: Negative for abdominal pain, diarrhea, constipation and abdominal distention.  Genitourinary: Negative for urgency, frequency, decreased urine volume and difficulty urinating.  Musculoskeletal: Negative for back pain, arthralgias and gait problem.  Skin: Negative for color change, pallor and rash.  Neurological: Negative for dizziness, light-headedness, numbness and headaches.  Hematological: Negative for adenopathy. Does not bruise/bleed easily.  Psychiatric/Behavioral: Negative for suicidal ideas, confusion, sleep disturbance, self-injury, dysphoric mood, decreased concentration and agitation.       Objective:    BP 108/70 mmHg  Pulse 61  Temp(Src) 98 F (36.7 C) (Oral)  Ht 5\' 10"  (1.778 m)  Wt 199 lb 6.4 oz (90.447 kg)  BMI 28.61 kg/m2  SpO2 96% General appearance: alert, cooperative, appears stated age and no  distress Head: Normocephalic, without obvious abnormality, atraumatic Eyes: conjunctivae/corneas clear. PERRL, EOM's intact. Fundi benign. Ears: normal TM's and external ear canals both ears Nose: Nares normal. Septum midline. Mucosa normal. No drainage or sinus tenderness. Throat: lips, mucosa, and tongue normal; teeth and gums normal Neck: no adenopathy, no carotid bruit, no JVD,  supple, symmetrical, trachea midline and thyroid not enlarged, symmetric, no tenderness/mass/nodules Back: symmetric, no curvature. ROM normal. No CVA tenderness. Lungs: clear to auscultation bilaterally Breasts: normal appearance, no masses or tenderness Heart: regular rate and rhythm, S1, S2 normal, no murmur, click, rub or gallop Abdomen: soft, non-tender; bowel sounds normal; no masses,  no organomegaly Pelvic: deferred Extremities: extremities normal, atraumatic, no cyanosis or edema Pulses: 2+ and symmetric Skin: Skin color, texture, turgor normal. No rashes or lesions Lymph nodes: Cervical, supraclavicular, and axillary nodes normal. Neurologic: Alert and oriented X 3, normal strength and tone. Normal symmetric reflexes. Normal coordination and gait Psych- no depression, no anxiety      Assessment:    Healthy female exam.      Plan:    ghm utd Check labs See After Visit Summary for Counseling Recommendations   1. Hot flashes, menopausal Refill med - venlafaxine XR (EFFEXOR-XR) 150 MG 24 hr capsule; TAKE 1 CAPSULE BY MOUTH ONCE DAILY  Dispense: 90 capsule; Refill: 3  2. Preventative health care   - Basic metabolic panel - CBC with Differential - Hepatic function panel - Lipid panel - POCT urinalysis dipstick - TSH  3. Hypothyroidism, unspecified hypothyroidism type Check lab, con't meds - TSH  4. Breast cancer screening   - MM DIGITAL SCREENING BILATERAL; Future  5. Family history of carotid artery stenosis   - US Carotid Duplex Bilateral; Future

## 2014-04-11 NOTE — Progress Notes (Signed)
Pre visit review using our clinic review tool, if applicable. No additional management support is needed unless otherwise documented below in the visit note. 

## 2014-04-15 ENCOUNTER — Encounter (HOSPITAL_COMMUNITY): Payer: 59

## 2014-04-27 ENCOUNTER — Telehealth: Payer: Self-pay | Admitting: Family Medicine

## 2014-04-27 NOTE — Telephone Encounter (Signed)
Left message for patient to return my call.

## 2014-04-27 NOTE — Telephone Encounter (Signed)
It is really just a screening ordered at pt request.  Please inform pt that for a screening it is $50 --- she may want it anyway.

## 2014-04-27 NOTE — Telephone Encounter (Signed)
Caller name: [facility] Relation to pt: Call back number: [phone] Pharmacy:  Reason for call:   [facility] Anaracy states that patient insurance will not cover the dx code for the carotid. She would like to know if this could be changed? She could do a regular screening but this would charge patient $50 upfront.  She state that any stroke like symptom dx would work, if patient is having those symptoms.

## 2014-04-28 NOTE — Telephone Encounter (Signed)
Left message for patient to return my call.

## 2014-04-29 ENCOUNTER — Other Ambulatory Visit (HOSPITAL_COMMUNITY): Payer: Self-pay | Admitting: *Deleted

## 2014-04-29 DIAGNOSIS — Z139 Encounter for screening, unspecified: Secondary | ICD-10-CM

## 2014-04-29 NOTE — Telephone Encounter (Signed)
Informed patient that she will need to pay $50 for this screening and she states that this is fine and she still wants to have this done.

## 2014-05-10 ENCOUNTER — Ambulatory Visit (HOSPITAL_COMMUNITY): Payer: 59

## 2014-05-24 ENCOUNTER — Ambulatory Visit (HOSPITAL_COMMUNITY): Payer: 59

## 2014-05-30 ENCOUNTER — Encounter: Payer: Self-pay | Admitting: Physician Assistant

## 2014-05-30 ENCOUNTER — Ambulatory Visit (INDEPENDENT_AMBULATORY_CARE_PROVIDER_SITE_OTHER): Payer: 59 | Admitting: Physician Assistant

## 2014-05-30 VITALS — BP 122/67 | HR 79 | Temp 98.6°F | Resp 16 | Ht 70.0 in | Wt 200.4 lb

## 2014-05-30 DIAGNOSIS — B9689 Other specified bacterial agents as the cause of diseases classified elsewhere: Secondary | ICD-10-CM

## 2014-05-30 DIAGNOSIS — J019 Acute sinusitis, unspecified: Secondary | ICD-10-CM

## 2014-05-30 HISTORY — DX: Other specified bacterial agents as the cause of diseases classified elsewhere: B96.89

## 2014-05-30 MED ORDER — AZITHROMYCIN 250 MG PO TABS: ORAL_TABLET | ORAL | Status: DC

## 2014-05-30 NOTE — Assessment & Plan Note (Signed)
Rx Azithromycin.  Increase fluids.  Rest.  Saline nasal spray.  Probiotic.  Mucinex as directed.  Humidifier in bedroom. Plain Mucinex.  Call or return to clinic if symptoms are not improving.

## 2014-05-30 NOTE — Patient Instructions (Signed)
Please take antibiotic as directed.  Increase fluid intake.  Use Saline nasal spray.  Take a daily multivitamin.  Place a humidifier in the bedroom.  Please call or return clinic if symptoms are not improving.  Sinusitis Sinusitis is redness, soreness, and swelling (inflammation) of the paranasal sinuses. Paranasal sinuses are air pockets within the bones of your face (beneath the eyes, the middle of the forehead, or above the eyes). In healthy paranasal sinuses, mucus is able to drain out, and air is able to circulate through them by way of your nose. However, when your paranasal sinuses are inflamed, mucus and air can become trapped. This can allow bacteria and other germs to grow and cause infection. Sinusitis can develop quickly and last only a short time (acute) or continue over a long period (chronic). Sinusitis that lasts for more than 12 weeks is considered chronic.  CAUSES  Causes of sinusitis include:  Allergies.  Structural abnormalities, such as displacement of the cartilage that separates your nostrils (deviated septum), which can decrease the air flow through your nose and sinuses and affect sinus drainage.  Functional abnormalities, such as when the small hairs (cilia) that line your sinuses and help remove mucus do not work properly or are not present. SYMPTOMS  Symptoms of acute and chronic sinusitis are the same. The primary symptoms are pain and pressure around the affected sinuses. Other symptoms include:  Upper toothache.  Earache.  Headache.  Bad breath.  Decreased sense of smell and taste.  A cough, which worsens when you are lying flat.  Fatigue.  Fever.  Thick drainage from your nose, which often is green and may contain pus (purulent).  Swelling and warmth over the affected sinuses. DIAGNOSIS  Your caregiver will perform a physical exam. During the exam, your caregiver may:  Look in your nose for signs of abnormal growths in your nostrils (nasal  polyps).  Tap over the affected sinus to check for signs of infection.  View the inside of your sinuses (endoscopy) with a special imaging device with a light attached (endoscope), which is inserted into your sinuses. If your caregiver suspects that you have chronic sinusitis, one or more of the following tests may be recommended:  Allergy tests.  Nasal culture A sample of mucus is taken from your nose and sent to a lab and screened for bacteria.  Nasal cytology A sample of mucus is taken from your nose and examined by your caregiver to determine if your sinusitis is related to an allergy. TREATMENT  Most cases of acute sinusitis are related to a viral infection and will resolve on their own within 10 days. Sometimes medicines are prescribed to help relieve symptoms (pain medicine, decongestants, nasal steroid sprays, or saline sprays).  However, for sinusitis related to a bacterial infection, your caregiver will prescribe antibiotic medicines. These are medicines that will help kill the bacteria causing the infection.  Rarely, sinusitis is caused by a fungal infection. In theses cases, your caregiver will prescribe antifungal medicine. For some cases of chronic sinusitis, surgery is needed. Generally, these are cases in which sinusitis recurs more than 3 times per year, despite other treatments. HOME CARE INSTRUCTIONS   Drink plenty of water. Water helps thin the mucus so your sinuses can drain more easily.  Use a humidifier.  Inhale steam 3 to 4 times a day (for example, sit in the bathroom with the shower running).  Apply a warm, moist washcloth to your face 3 to 4 times a day,   or as directed by your caregiver.  Use saline nasal sprays to help moisten and clean your sinuses.  Take over-the-counter or prescription medicines for pain, discomfort, or fever only as directed by your caregiver. SEEK IMMEDIATE MEDICAL CARE IF:  You have increasing pain or severe headaches.  You have  nausea, vomiting, or drowsiness.  You have swelling around your face.  You have vision problems.  You have a stiff neck.  You have difficulty breathing. MAKE SURE YOU:   Understand these instructions.  Will watch your condition.  Will get help right away if you are not doing well or get worse. Document Released: 04/29/2005 Document Revised: 07/22/2011 Document Reviewed: 05/14/2011 [employer] Patient Information 2014 [employer].   

## 2014-05-30 NOTE — Progress Notes (Signed)
Pre visit review using our clinic review tool, if applicable. No additional management support is needed unless otherwise documented below in the visit note/SLS  

## 2014-05-30 NOTE — Progress Notes (Signed)
Patient presents to clinic today c/o chest congestion, head congestion, ear pain bilaterally x 1 week.  Symptoms initially improved, but acutely worsened a couple of day ago.  Endorses feeling fever.  Denies SOB, chest pain or wheezing.  Endorses PND and dry cough.  Has had flu shot this year.  Past Medical History  Diagnosis Date  . Thyroid disease     hypo  . Menopause     Current Outpatient Prescriptions on File Prior to Visit  Medication Sig Dispense Refill  . EPINEPHrine (EPIPEN) 0.3 mg/0.3 mL IJ SOAJ injection Inject 0.3 mLs (0.3 mg total) into the muscle once as needed (allergic reaction). 1 Device 0  . levothyroxine (SYNTHROID, LEVOTHROID) 112 MCG tablet TAKE 1 TABLET BY MOUTH ONCE DAILY 90 tablet 0  . venlafaxine XR (EFFEXOR-XR) 150 MG 24 hr capsule TAKE 1 CAPSULE BY MOUTH ONCE DAILY 90 capsule 3   No current facility-administered medications on file prior to visit.   Allergies  Allergen Reactions  . Bee Venom Swelling  . Morphine     REACTION: vomitting    Family History  Problem Relation Age of Onset  . Alcohol abuse Mother   . Parkinsonism Mother   . Hypertension Father   . Cancer Father 66    prostate  . Stroke Brother     History   Social History  . Marital Status: Married    Spouse Name: N/A    Number of Children: N/A  . Years of Education: N/A   Occupational History  . OR [employer]   Social History Main Topics  . Smoking status: Former Games developer  . Smokeless tobacco: Never Used     Comment: over 20 years ago  . Alcohol Use: 1.8 oz/week    3 Glasses of wine per week  . Drug Use: No  . Sexual Activity:    Partners: Male   Other Topics Concern  . None   Social History Narrative   Exercise-- water aerobics    Caffeine Use: daily 1 cup of coffee   Review of Systems - See HPI.  All other ROS are negative.  BP 122/67 mmHg  Pulse 79  Temp(Src) 98.6 F (37 C) (Oral)  Resp 16  Ht  (1.778 m)  Wt 200 lb 6 oz (90.89 kg)   BMI 28.75 kg/m2  SpO2 99%  Physical Exam  Constitutional: She is oriented to person, place, and time and well-developed, well-nourished, and in no distress.  HENT:  Head: Normocephalic and atraumatic.  Right Ear: External ear normal.  Left Ear: External ear normal.  Nose: Nose normal.  Mouth/Throat: Oropharynx is clear and moist. No oropharyngeal exudate.  TM within normal limits bilaterally.  + TTP of sinuses noted on exam.  Eyes: Conjunctivae are normal.  Neck: Neck supple.  Cardiovascular: Normal rate, regular rhythm, normal heart sounds and intact distal pulses.   Pulmonary/Chest: Effort normal and breath sounds normal. No respiratory distress. She has no wheezes. She has no rales. She exhibits no tenderness.  Lymphadenopathy:    She has no cervical adenopathy.  Neurological: She is alert and oriented to person, place, and time.  Skin: Skin is warm and dry. No rash noted.  Psychiatric: Affect normal.  Vitals reviewed.   Recent Results (from the past 2160 hour(s))  Basic metabolic panel     Status: Abnormal   Collection Time: 04/11/14 10:19 AM  Result Value Ref Range   Sodium 136 135 - 145 mEq/L   Potassium  4.0 3.5 - 5.1 mEq/L   Chloride 102 96 - 112 mEq/L   CO2 26 19 - 32 mEq/L   Glucose, Bld 100 (H) 70 - 99 mg/dL   BUN 16 6 - 23 mg/dL   Creatinine, Ser 0.7 0.4 - 1.2 mg/dL   Calcium 8.9 8.4 - 16.1 mg/dL   GFR 09.60 >45.40 mL/min  CBC with Differential     Status: None   Collection Time: 04/11/14 10:19 AM  Result Value Ref Range   WBC 5.1 4.0 - 10.5 K/uL   RBC 4.79 3.87 - 5.11 Mil/uL   Hemoglobin 14.4 12.0 - 15.0 g/dL   HCT 98.1 19.1 - 47.8 %   MCV 92.0 78.0 - 100.0 fl   MCHC 32.8 30.0 - 36.0 g/dL   RDW 29.5 62.1 - 30.8 %   Platelets 290.0 150.0 - 400.0 K/uL   Neutrophils Relative % 53.7 43.0 - 77.0 %   Lymphocytes Relative 35.6 12.0 - 46.0 %   Monocytes Relative 7.4 3.0 - 12.0 %   Eosinophils Relative 2.5 0.0 - 5.0 %   Basophils Relative 0.8 0.0 - 3.0 %    Neutro Abs 2.7 1.4 - 7.7 K/uL   Lymphs Abs 1.8 0.7 - 4.0 K/uL   Monocytes Absolute 0.4 0.1 - 1.0 K/uL   Eosinophils Absolute 0.1 0.0 - 0.7 K/uL   Basophils Absolute 0.0 0.0 - 0.1 K/uL  Hepatic function panel     Status: None   Collection Time: 04/11/14 10:19 AM  Result Value Ref Range   Total Bilirubin 0.6 0.2 - 1.2 mg/dL   Bilirubin, Direct 0.0 0.0 - 0.3 mg/dL   Alkaline Phosphatase 64 39 - 117 U/L   AST 18 0 - 37 U/L   ALT 23 0 - 35 U/L   Total Protein 6.7 6.0 - 8.3 g/dL   Albumin 4.3 3.5 - 5.2 g/dL  Lipid panel     Status: Abnormal   Collection Time: 04/11/14 10:19 AM  Result Value Ref Range   Cholesterol 219 (H) 0 - 200 mg/dL    Comment: ATP III Classification       Desirable:  < 200 mg/dL               Borderline High:  200 - 239 mg/dL          High:  > = 657 mg/dL   Triglycerides 846.9 0.0 - 149.0 mg/dL    Comment: Normal:  <629 mg/dLBorderline High:  150 - 199 mg/dL   HDL 52.84 >13.24 mg/dL   VLDL 40.1 0.0 - 02.7 mg/dL   LDL Cholesterol 253 (H) 0 - 99 mg/dL   Total CHOL/HDL Ratio 4     Comment:                Men          Women1/2 Average Risk     3.4          3.3Average Risk          5.0          4.42X Average Risk          9.6          7.13X Average Risk          15.0          11.0                       NonHDL 161.00     Comment:  NOTE:  Non-HDL goal should be 30 mg/dL higher than patient's LDL goal (i.e. LDL goal of < 70 mg/dL, would have non-HDL goal of < 100 mg/dL)  TSH     Status: None   Collection Time: 04/11/14 10:19 AM  Result Value Ref Range   TSH 1.48 0.35 - 4.50 uIU/mL  POCT urinalysis dipstick     Status: Normal   Collection Time: 04/11/14  5:14 PM  Result Value Ref Range   Color, UA yellow    Clarity, UA clear    Glucose, UA neg    Bilirubin, UA neg    Ketones, UA neg    Spec Grav, UA >=1.030    Blood, UA neg    pH, UA 5.5    Protein, UA neg    Urobilinogen, UA negative    Nitrite, UA neg    Leukocytes, UA Negative     Assessment/Plan: Acute  bacterial sinusitis Rx Azithromycin.  Increase fluids.  Rest.  Saline nasal spray.  Probiotic.  Mucinex as directed.  Humidifier in bedroom. Plain Mucinex.  Call or return to clinic if symptoms are not improving.

## 2014-06-03 ENCOUNTER — Ambulatory Visit (HOSPITAL_COMMUNITY): Payer: 59

## 2014-07-28 ENCOUNTER — Other Ambulatory Visit: Payer: Self-pay | Admitting: Dermatology

## 2014-08-09 ENCOUNTER — Other Ambulatory Visit: Payer: Self-pay | Admitting: Family Medicine

## 2014-09-04 ENCOUNTER — Encounter (HOSPITAL_COMMUNITY): Payer: Self-pay | Admitting: *Deleted

## 2014-09-04 ENCOUNTER — Emergency Department (HOSPITAL_COMMUNITY)
Admission: EM | Admit: 2014-09-04 | Discharge: 2014-09-05 | Disposition: A | Discharge status: Home / Self Care | Payer: 59 | Attending: Emergency Medicine | Admitting: Emergency Medicine

## 2014-09-04 ENCOUNTER — Emergency Department (HOSPITAL_COMMUNITY): Payer: 59

## 2014-09-04 DIAGNOSIS — W109XXA Fall (on) (from) unspecified stairs and steps, initial encounter: Secondary | ICD-10-CM | POA: Diagnosis not present

## 2014-09-04 DIAGNOSIS — E039 Hypothyroidism, unspecified: Secondary | ICD-10-CM | POA: Diagnosis not present

## 2014-09-04 DIAGNOSIS — Z792 Long term (current) use of antibiotics: Secondary | ICD-10-CM | POA: Diagnosis not present

## 2014-09-04 DIAGNOSIS — Y93E2 Activity, laundry: Secondary | ICD-10-CM | POA: Diagnosis not present

## 2014-09-04 DIAGNOSIS — Z79899 Other long term (current) drug therapy: Secondary | ICD-10-CM | POA: Diagnosis not present

## 2014-09-04 DIAGNOSIS — Y998 Other external cause status: Secondary | ICD-10-CM | POA: Diagnosis not present

## 2014-09-04 DIAGNOSIS — Y929 Unspecified place or not applicable: Secondary | ICD-10-CM | POA: Insufficient documentation

## 2014-09-04 DIAGNOSIS — S3994XA Unspecified injury of external genitals, initial encounter: Secondary | ICD-10-CM | POA: Diagnosis present

## 2014-09-04 DIAGNOSIS — Z87891 Personal history of nicotine dependence: Secondary | ICD-10-CM | POA: Diagnosis not present

## 2014-09-04 DIAGNOSIS — Z23 Encounter for immunization: Secondary | ICD-10-CM | POA: Insufficient documentation

## 2014-09-04 DIAGNOSIS — Z780 Asymptomatic menopausal state: Secondary | ICD-10-CM | POA: Diagnosis not present

## 2014-09-04 DIAGNOSIS — S3141XA Laceration without foreign body of vagina and vulva, initial encounter: Secondary | ICD-10-CM | POA: Diagnosis not present

## 2014-09-04 MED ORDER — LIDOCAINE-EPINEPHRINE (PF) 2 %-1:200000 IJ SOLN
20.0000 mL | Freq: Once | INTRAMUSCULAR | Status: AC
  Administered 2014-09-05: 20 mL via INTRADERMAL
  Filled 2014-09-04: qty 20

## 2014-09-04 NOTE — ED Notes (Signed)
Pt states that she was carrying a laundry basket and straddled a thin plywood board and fell onto it. States that her perineum is bleeding. Also c/o left elbow pain.

## 2014-09-05 MED ORDER — TETANUS-DIPHTH-ACELL PERTUSSIS 5-2.5-18.5 LF-MCG/0.5 IM SUSP
0.5000 mL | Freq: Once | INTRAMUSCULAR | Status: AC
  Administered 2014-09-05: 0.5 mL via INTRAMUSCULAR
  Filled 2014-09-05: qty 0.5

## 2014-09-05 NOTE — ED Provider Notes (Signed)
CSN: [id number]     Arrival date & time 09/04/14  2008 History   First MD Initiated Contact with Patient 09/04/14 2219     Chief Complaint  Patient presents with  . Fall     (Consider location/radiation/quality/duration/timing/severity/associated sxs/prior Treatment) HPI  This is a 59 year old female, with no pertinent past medical history, presenting today with pain associated with injury to her groin area. Onset prior to arrival. Located "either my vaginal or perineal area." Persistent, sharp. No medicines taken for this. Nonradiating. Positive for bleeding from that area. Negative for abdominal pain, nausea, vomiting, vaginal bleeding, or hematochezia.  Past Medical History  Diagnosis Date  . Thyroid disease     hypo  . Menopause    Past Surgical History  Procedure Laterality Date  . Tonsillectomy    . Tubal ligation    . Oraf      left eyebrow  . Orif elbow fracture     Family History  Problem Relation Age of Onset  . Alcohol abuse Mother   . Parkinsonism Mother   . Hypertension Father   . Cancer Father 19    prostate  . Stroke Brother    History  Substance Use Topics  . Smoking status: Former Games developer  . Smokeless tobacco: Never Used     Comment: over 20 years ago  . Alcohol Use: 1.8 oz/week    3 Glasses of wine per week   OB History    No data available     Review of Systems  Constitutional: Negative for fever and chills.  HENT: Negative for facial swelling.   Eyes: Negative for photophobia and pain.  Respiratory: Negative for cough and shortness of breath.   Cardiovascular: Negative for chest pain and leg swelling.  Gastrointestinal: Negative for nausea, vomiting and abdominal pain.  Genitourinary: Negative for dysuria.  Musculoskeletal: Positive for arthralgias.  Skin: Negative for wound.  Neurological: Negative for seizures.  Hematological: Negative for adenopathy.      Allergies  Bee venom and Morphine  Home Medications   Prior to  Admission medications   Medication Sig Start Date End Date Taking? Authorizing Provider  acetaminophen (TYLENOL) 500 MG tablet Take 1,000 mg by mouth every 6 (six) hours as needed for moderate pain.   Yes Historical Provider, MD  EPINEPHrine (EPIPEN) 0.3 mg/0.3 mL IJ SOAJ injection Inject 0.3 mLs (0.3 mg total) into the muscle once as needed (allergic reaction). 12/31/13  Yes Bayard Beaver Saguier, PA-C  levothyroxine (SYNTHROID, LEVOTHROID) 112 MCG tablet TAKE 1 TABLET BY MOUTH ONCE DAILY 08/09/14  Yes Yvonne R Lowne, DO  MAGNESIUM CITRATE PO Take 150 mg by mouth at bedtime.   Yes Historical Provider, MD  OVER THE COUNTER MEDICATION Take 1 capsule by mouth 2 (two) times daily. "FORMULA 1" LAXATIVE FORMULA   Yes Historical Provider, MD  venlafaxine XR (EFFEXOR-XR) 150 MG 24 hr capsule TAKE 1 CAPSULE BY MOUTH ONCE DAILY Patient taking differently: Take 150 mg by mouth at bedtime. TAKE 1 CAPSULE BY MOUTH ONCE DAILY 04/11/14  Yes Yvonne R Lowne, DO  azithromycin (ZITHROMAX) 250 MG tablet Take 2 tablets on Day 1.  Then take 1 tablet daily. 05/30/14   Waldon Merl, PA-C   BP 124/55 mmHg  Pulse 71  Temp(Src) 97.4 F (36.3 C) (Oral)  Resp 18  Ht  (1.753 m)  Wt 185 lb (83.915 kg)  BMI 27.31 kg/m2  SpO2 98% Physical Exam  Constitutional: She is oriented to person, place, and time. She  appears well-developed and well-nourished. No distress.  HENT:  Head: Normocephalic and atraumatic.  Mouth/Throat: No oropharyngeal exudate.  Eyes: Conjunctivae are normal. Pupils are equal, round, and reactive to light. No scleral icterus.  Neck: Normal range of motion. No tracheal deviation present. No thyromegaly present.  Cardiovascular: Normal rate, regular rhythm and normal heart sounds.  Exam reveals no gallop and no friction rub.   No murmur heard. Pulmonary/Chest: Effort normal and breath sounds normal. No stridor. No respiratory distress. She has no wheezes. She has no rales. She exhibits no tenderness.   Abdominal: Soft. She exhibits no distension and no mass. There is no tenderness. There is no rebound and no guarding.  Genitourinary: There is injury (4 cm laceration, vertical, hemostatic, to the medial aspect of the labia minora) on the left labia.  Musculoskeletal: Normal range of motion. She exhibits no edema.       Left forearm: She exhibits tenderness (olecranon).  2+ pulses, intact sensory, motor function of the left upper extremity  Neurological: She is alert and oriented to person, place, and time.  Skin: Skin is warm and dry. She is not diaphoretic.    ED Course  LACERATION REPAIR Date/Time: 09/05/2014 1:21 AM Performed by: Gerhard Munch Authorized by: Loma Boston Consent: Verbal consent obtained. Risks and benefits: risks, benefits and alternatives were discussed Consent given by: patient Patient understanding: patient states understanding of the procedure being performed Patient consent: the patient's understanding of the procedure matches consent given Procedure consent: procedure consent matches procedure scheduled Relevant documents: relevant documents present and verified Test results: test results available and properly labeled Imaging studies: imaging studies available Required items: required blood products, implants, devices, and special equipment available Patient identity confirmed: verbally with patient Time out: Immediately prior to procedure a "time out" was called to verify the correct patient, procedure, equipment, support staff and site/side marked as required. Body area: anogenital (medial aspect of the left labia minora) Laceration length: 4 cm Foreign bodies: no foreign bodies Nerve involvement: none Vascular damage: no Patient sedated: no Preparation: Patient was prepped and draped in the usual sterile fashion. Irrigation solution: saline Irrigation method: jet lavage Amount of cleaning: standard Debridement: none Wound mucous membrane  closure material used: 4-0 vicryl rapid. Number of sutures: 4 Technique: simple Approximation difficulty: simple Patient tolerance: Patient tolerated the procedure well with no immediate complications   (including critical care time) Labs Review Labs Reviewed - No data to display  Imaging Review Dg Elbow Complete Left  09/04/2014   CLINICAL DATA:  Fall today.  Pain and swelling  EXAM: LEFT ELBOW - COMPLETE 3+ VIEW  COMPARISON:  None.  FINDINGS: Mild joint space narrowing laterally. Irregularity of the radial neck which could be due to nondisplaced impaction fracture versus chronic spurring from degenerative change. Mild degenerative change medially. Small joint effusion.  IMPRESSION: Probable spurring of the radial neck however cannot exclude nondisplaced fracture.   Electronically Signed   By: Marlan Palau M.D.   On: 09/04/2014 22:04    MDM   Final diagnoses:  Vaginal laceration, initial encounter    This is a 59 year old female, with no pertinent past medical history, presenting today with pain associated with injury to her groin area. Onset prior to arrival. Located "either my vaginal or perineal area." Persistent, sharp. No medicines taken for this. Nonradiating. Positive for bleeding from that area. Negative for abdominal pain, nausea, vomiting, vaginal bleeding, or hematochezia.  The patient also reports left elbow pain.  Mechanism of injury was  a mechanical fall. She was doing laundry, carrying laundry, new food, tripped down the stairs, falling and a straddled position over a board. Patient did not hit her head, neck, or back. Negative for LOC or amnesia.  On examination, airways intact. Breath sounds are equal bilaterally. Patient is medically stable. GCS 15. She has been really exposed. Secondary exam is within normal limits, with the exception of tenderness to palpation of the left olecranon, 4 cm laceration to the left vaginal mucosa. As above, I have repaired the vaginal  mucosa.  X-ray of the left elbow reveals likely spur. It is located along the radial neck, and this does not correlate with where she experiences her pain. I do not believe that the finding represents fracture.  Pt stable for discharge, FU in 7-10 days for wound evaluation.  All questions answered.  Return precautions given.  I have discussed case and care has been guided by my attending physician, Dr. Jeraldine LootsLockwood.  Loma BostonStirling Lillyen Schow, MD 09/05/14 [id number]  Gerhard Munchobert Lockwood, MD 09/08/14 [phone]

## 2014-09-05 NOTE — Discharge Instructions (Signed)

## 2014-09-07 ENCOUNTER — Telehealth: Payer: Self-pay | Admitting: Family Medicine

## 2014-09-07 ENCOUNTER — Encounter (HOSPITAL_COMMUNITY): Payer: Self-pay | Admitting: Emergency Medicine

## 2014-09-07 ENCOUNTER — Emergency Department (HOSPITAL_COMMUNITY)
Admission: EM | Admit: 2014-09-07 | Discharge: 2014-09-07 | Disposition: A | Discharge status: Home / Self Care | Payer: 59 | Source: Home / Self Care | Attending: Family Medicine | Admitting: Family Medicine

## 2014-09-07 DIAGNOSIS — S3141XD Laceration without foreign body of vagina and vulva, subsequent encounter: Secondary | ICD-10-CM | POA: Diagnosis not present

## 2014-09-07 DIAGNOSIS — Z5189 Encounter for other specified aftercare: Secondary | ICD-10-CM | POA: Diagnosis not present

## 2014-09-07 NOTE — Telephone Encounter (Signed)
Pt scheduled for 09/13/2014 for 11:30am ED follow up. Thank you

## 2014-09-07 NOTE — Discharge Instructions (Signed)
Please keep area clean and dry. Sitz baths as needed for comfort. Tylenol as directed on packaging for discomfort. As we discussed, the collection of blood that formed under the skin at time of injury (hematoma) has begun to liquify and is now draining through the center portion of your incision. Your incision appears to be healing well and is without evidence of infection or active bleeding. Please continue to monitor closely.

## 2014-09-07 NOTE — Telephone Encounter (Signed)
error:[id number] ° °

## 2014-09-07 NOTE — ED Provider Notes (Signed)
CSN: [id number]     Arrival date & time 09/07/14  1241 History   First MD Initiated Contact with Patient 09/07/14 1444     Chief Complaint  Patient presents with  . Follow-up   (Consider location/radiation/quality/duration/timing/severity/associated sxs/prior Treatment) HPI Comments: Patient was climbing over a piece of plywood being used as a gate to contain her new puppy and stumbled and fell landing astride the piece of wood and lacerated her left labia majora. She was seen on day of injury (09/04/2014) in emergency room and laceration was repaired. She is here for wound re-check stating she still has some bloody drainage from wound. Also would like to know if she can return to work tomorrow. Works as Charity fundraiser in [employer].   The history is provided by the patient.    Past Medical History  Diagnosis Date  . Thyroid disease     hypo  . Menopause    Past Surgical History  Procedure Laterality Date  . Tonsillectomy    . Tubal ligation    . Oraf      left eyebrow  . Orif elbow fracture     Family History  Problem Relation Age of Onset  . Alcohol abuse Mother   . Parkinsonism Mother   . Hypertension Father   . Cancer Father 25    prostate  . Stroke Brother    History  Substance Use Topics  . Smoking status: Former Games developer  . Smokeless tobacco: Never Used     Comment: over 20 years ago  . Alcohol Use: 1.8 oz/week    3 Glasses of wine per week   OB History    No data available     Review of Systems  All other systems reviewed and are negative.   Allergies  Bee venom and Morphine  Home Medications   Prior to Admission medications   Medication Sig Start Date End Date Taking? Authorizing Provider  levothyroxine (SYNTHROID, LEVOTHROID) 112 MCG tablet TAKE 1 TABLET BY MOUTH ONCE DAILY 08/09/14  Yes Yvonne R Lowne, DO  venlafaxine XR (EFFEXOR-XR) 150 MG 24 hr capsule TAKE 1 CAPSULE BY MOUTH ONCE DAILY Patient taking differently: Take 150 mg by mouth at bedtime. TAKE 1 CAPSULE BY  MOUTH ONCE DAILY 04/11/14  Yes Lelon Perla, DO  acetaminophen (TYLENOL) 500 MG tablet Take 1,000 mg by mouth every 6 (six) hours as needed for moderate pain.    Historical Provider, MD  azithromycin (ZITHROMAX) 250 MG tablet Take 2 tablets on Day 1.  Then take 1 tablet daily. 05/30/14   Waldon Merl, PA-C  EPINEPHrine (EPIPEN) 0.3 mg/0.3 mL IJ SOAJ injection Inject 0.3 mLs (0.3 mg total) into the muscle once as needed (allergic reaction). 12/31/13   Bayard Beaver Saguier, PA-C  MAGNESIUM CITRATE PO Take 150 mg by mouth at bedtime.    Historical Provider, MD  OVER THE COUNTER MEDICATION Take 1 capsule by mouth 2 (two) times daily. "FORMULA 1" LAXATIVE FORMULA    Historical Provider, MD   BP 118/63 mmHg  Pulse 69  Temp(Src) 98.4 F (36.9 C) (Oral)  Resp 16  SpO2 97% Physical Exam  Constitutional: She is oriented to person, place, and time. She appears well-developed and well-nourished. No distress.  HENT:  Head: Normocephalic and atraumatic.  Cardiovascular: Normal rate.   Pulmonary/Chest: Effort normal.  Genitourinary:     Outlined area is with ecchymosis and hematoma Scant liquefied hematoma (serousanguinous) fluid expressed though central portion of healing wound during examination. Patient states this  is consistent with what she has been seeing on her dressing at home.  Vicryl sutures visible   Neurological: She is alert and oriented to person, place, and time.  Skin: Skin is warm and dry.  Psychiatric: She has a normal mood and affect. Her behavior is normal.  Nursing note and vitals reviewed.   ED Course  Procedures (including critical care time) Labs Review Labs Reviewed - No data to display  Imaging Review No results found.   MDM   1. Encounter for wound re-check   Patient is having expression of liquefied hematoma through the central portion of her wound. Otherwise wound appears to be healing appropriately and without clinical signs of infection. Continue sitz baths  and dressing changes at home May return to work Scheduled for follow up with PCP for wound check on 09/13/2014.    Ria ClockJennifer Lee H Presson, GeorgiaPA 09/07/14 2023

## 2014-09-07 NOTE — Telephone Encounter (Addendum)
Caller name: Relation to pt: Call back number: Pharmacy:  Reason for call:  Pt was seen in the ED 09/04/2014 for vaginal laceration and Dr. Laury AxonLowne does not have any available appointments that work for pt due to work schedule. Pt requesting a female physican for Tue 09/13/2014 please advise?   Since this is not your patient may i schedule her in your 11am hospital follow up slot. Please advise

## 2014-09-07 NOTE — Telephone Encounter (Signed)
Per provider pt only needs a 15 minute appt. Ok to schedule with her.

## 2014-09-07 NOTE — ED Notes (Signed)
Here for a f[facility] and to have stitches removed Alert, no signs of acute distress.

## 2014-09-13 ENCOUNTER — Ambulatory Visit (INDEPENDENT_AMBULATORY_CARE_PROVIDER_SITE_OTHER): Payer: 59 | Admitting: Family Medicine

## 2014-09-13 ENCOUNTER — Encounter: Payer: Self-pay | Admitting: Family Medicine

## 2014-09-13 VITALS — BP 110/70 | HR 65 | Temp 98.0°F | Resp 16 | Wt 186.4 lb

## 2014-09-13 DIAGNOSIS — S3141XA Laceration without foreign body of vagina and vulva, initial encounter: Secondary | ICD-10-CM

## 2014-09-13 DIAGNOSIS — S3140XD Unspecified open wound of vagina and vulva, subsequent encounter: Secondary | ICD-10-CM | POA: Diagnosis not present

## 2014-09-13 DIAGNOSIS — S3141XD Laceration without foreign body of vagina and vulva, subsequent encounter: Secondary | ICD-10-CM

## 2014-09-13 HISTORY — DX: Laceration without foreign body of vagina and vulva, initial encounter: S31.41XA

## 2014-09-13 NOTE — Assessment & Plan Note (Signed)
New to provider.  Well healed.  No drainage.  No signs of infxn.  No need for abx.  Sutures have dissolved.  No restrictions for pt at this time.  Pt expressed understanding and is in agreement w/ plan.

## 2014-09-13 NOTE — Patient Instructions (Signed)
Follow up as needed At this time, the stitches have dissolved and appears to be well healed Call with any questions or concerns Hang in there!!!

## 2014-09-13 NOTE — Progress Notes (Signed)
   Subjective:    Patient ID: Michelle Pratt, female    DOB: 04/11/1956, 59 y.o.   MRN: [id number]  HPI Vaginal laceration- pt fell astride a wood gait on 4/24 and lacerated her L labia.  Wound was repaired in ER.  Pt went to UC on 4/27 for wound recheck and things were healing well.  Pt reports stitches have dissolved and she continues to have scant serosanguinous drainage.  + bruising.  Pt was in bed for 3 days after injury.   Review of Systems For ROS see HPI     Objective:   Physical Exam  Constitutional: She is oriented to person, place, and time. She appears well-developed and well-nourished. No distress.  Genitourinary:  L labial laceration is well healed.  No sutures remain.  Bruising extends from mons to perineum but no hematoma present.  No drainage  Neurological: She is alert and oriented to person, place, and time.  Skin: Skin is warm and dry.  Psychiatric: She has a normal mood and affect. Her behavior is normal. Thought content normal.  Vitals reviewed.         Assessment & Plan:

## 2014-09-13 NOTE — Progress Notes (Signed)
Pre visit review using our clinic review tool, if applicable. No additional management support is needed unless otherwise documented below in the visit note. 

## 2015-04-10 ENCOUNTER — Other Ambulatory Visit: Payer: Self-pay | Admitting: Family Medicine

## 2015-04-10 NOTE — Telephone Encounter (Signed)
Last OV with PCP 04/11/14 Venlafaxine last filled 04/11/14 #90 with 3

## 2015-05-12 ENCOUNTER — Other Ambulatory Visit: Payer: Self-pay | Admitting: Family

## 2015-05-26 ENCOUNTER — Other Ambulatory Visit: Payer: Self-pay | Admitting: Family Medicine

## 2015-05-26 ENCOUNTER — Telehealth: Payer: Self-pay | Admitting: Family Medicine

## 2015-05-26 MED FILL — LEVOTHYROXINE 112 MCG TAB: 112 | 90 days supply | Qty: 90 | Fill #0

## 2015-05-26 NOTE — Telephone Encounter (Signed)
Caller name: Self   Can be [phone] (M)  Pharmacy:  [facility] - [place], [place] - [address] [phone] (Phone) [phone] (Fax)         Reason for call: Request refill on levothyroxine (SYNTHROID, LEVOTHROID) 112 MCG tablet [[id number]][[id number]]  And venlafaxine XR (EFFEXOR-XR) 150 MG 24 hr capsule [[id number]][[id number]]

## 2015-06-15 ENCOUNTER — Other Ambulatory Visit: Payer: Self-pay | Admitting: Family Medicine

## 2015-06-16 MED FILL — VENLAFAXINE HCL ER 150 MG C: 150 | 30 days supply | Qty: 30 | Fill #0

## 2015-06-27 ENCOUNTER — Encounter: Payer: Self-pay | Admitting: Behavioral Health

## 2015-07-17 ENCOUNTER — Other Ambulatory Visit: Payer: Self-pay | Admitting: Family Medicine

## 2015-07-17 MED FILL — VENLAFAXINE HCL ER 150 MG C: 150 | 30 days supply | Qty: 30 | Fill #0

## 2015-08-15 ENCOUNTER — Other Ambulatory Visit: Payer: Self-pay | Admitting: Family Medicine

## 2015-08-15 MED FILL — VENLAFAXINE HCL ER 150 MG C: 150 | 30 days supply | Qty: 30 | Fill #0

## 2015-08-15 NOTE — Telephone Encounter (Signed)
Refill x1 --- but pt needs ov

## 2015-08-15 NOTE — Telephone Encounter (Signed)
Dr. Laury AxonLowne, please advise. Pt has not been seen by you since 04/11/14 and not seen in office since 09/13/14.

## 2015-08-15 NOTE — Telephone Encounter (Signed)
Medication filled to pharmacy as requested.   

## 2015-08-23 NOTE — Telephone Encounter (Signed)
Attempted to contact patient via telephone to reschedule 09/08/2015 appointment with Dr. Zola ButtonLowne-Chase. Left message on Voicemail and sent My Chart message on 4/12. Appointment cancelled and letter mailed.

## 2015-09-01 ENCOUNTER — Other Ambulatory Visit: Payer: Self-pay | Admitting: Family Medicine

## 2015-09-01 NOTE — Telephone Encounter (Signed)
Please call this patient and schedule her a follow up with Dr.Lowne. I know we canceled her CPE but it has been 2 years since she has seen Dr.Lowne and we need to do labs.    KP

## 2015-09-05 ENCOUNTER — Other Ambulatory Visit: Payer: Self-pay

## 2015-09-05 DIAGNOSIS — E039 Hypothyroidism, unspecified: Secondary | ICD-10-CM

## 2015-09-05 MED ORDER — LEVOTHYROXINE SODIUM 112 MCG PO TABS: 112.0000 ug | ORAL_TABLET | Freq: Every day | ORAL | Status: DC

## 2015-09-05 MED FILL — LEVOTHYROXINE 112 MCG TAB: 112 | 30 days supply | Qty: 30 | Fill #0

## 2015-09-08 ENCOUNTER — Other Ambulatory Visit: Payer: Self-pay | Admitting: Family Medicine

## 2015-09-08 ENCOUNTER — Encounter: Payer: 59 | Admitting: Family Medicine

## 2015-09-08 MED ORDER — VENLAFAXINE HCL ER 150 MG PO CP24: 150.0000 mg | ORAL_CAPSULE | Freq: Every day | ORAL | Status: DC

## 2015-09-08 MED FILL — VENLAFAXINE HCL ER 150 MG C: 150 | 30 days supply | Qty: 30 | Fill #0

## 2015-09-08 NOTE — Telephone Encounter (Signed)
Last filled:  08/15/15 Amt: 30,0 Next OV: 09/26/15  Med filled for 30 day supply.

## 2015-09-08 NOTE — Telephone Encounter (Signed)
Caller name: Wilmer Floorherese S Relation to pt: self Call back number: [phone] Pharmacy:[facility] - [place], [place] - [address]  Reason for call: Pt is requesting refill for venlafaxine XR (EFFEXOR-XR) 150 MG 24 hr capsule. Please advise.

## 2015-09-13 ENCOUNTER — Other Ambulatory Visit: Payer: Self-pay | Admitting: Family Medicine

## 2015-09-25 ENCOUNTER — Telehealth: Payer: Self-pay | Admitting: *Deleted

## 2015-09-25 ENCOUNTER — Encounter: Payer: Self-pay | Admitting: *Deleted

## 2015-09-25 NOTE — Telephone Encounter (Signed)
Pre-Visit Call completed with patient and chart updated.   Pre-Visit Info documented in Specialty Comments under [employer].    

## 2015-09-26 ENCOUNTER — Other Ambulatory Visit (HOSPITAL_COMMUNITY)
Admission: RE | Admit: 2015-09-26 | Discharge: 2015-09-26 | Disposition: A | Discharge status: Home / Self Care | Payer: 59 | Source: Ambulatory Visit | Attending: Family Medicine | Admitting: Family Medicine

## 2015-09-26 ENCOUNTER — Encounter: Payer: Self-pay | Admitting: Family Medicine

## 2015-09-26 ENCOUNTER — Ambulatory Visit (INDEPENDENT_AMBULATORY_CARE_PROVIDER_SITE_OTHER): Payer: 59 | Admitting: Family Medicine

## 2015-09-26 VITALS — BP 100/80 | HR 61 | Temp 98.1°F | Ht 70.0 in | Wt 202.2 lb

## 2015-09-26 DIAGNOSIS — Z0000 Encounter for general adult medical examination without abnormal findings: Secondary | ICD-10-CM | POA: Diagnosis not present

## 2015-09-26 DIAGNOSIS — Z1151 Encounter for screening for human papillomavirus (HPV): Secondary | ICD-10-CM | POA: Diagnosis not present

## 2015-09-26 DIAGNOSIS — F32A Depression, unspecified: Secondary | ICD-10-CM

## 2015-09-26 DIAGNOSIS — R55 Syncope and collapse: Secondary | ICD-10-CM

## 2015-09-26 DIAGNOSIS — Z01419 Encounter for gynecological examination (general) (routine) without abnormal findings: Secondary | ICD-10-CM | POA: Diagnosis not present

## 2015-09-26 DIAGNOSIS — F329 Major depressive disorder, single episode, unspecified: Secondary | ICD-10-CM

## 2015-09-26 DIAGNOSIS — R319 Hematuria, unspecified: Secondary | ICD-10-CM

## 2015-09-26 DIAGNOSIS — Z1159 Encounter for screening for other viral diseases: Secondary | ICD-10-CM

## 2015-09-26 DIAGNOSIS — R42 Dizziness and giddiness: Secondary | ICD-10-CM

## 2015-09-26 DIAGNOSIS — Z1239 Encounter for other screening for malignant neoplasm of breast: Secondary | ICD-10-CM

## 2015-09-26 DIAGNOSIS — Z124 Encounter for screening for malignant neoplasm of cervix: Secondary | ICD-10-CM

## 2015-09-26 DIAGNOSIS — E039 Hypothyroidism, unspecified: Secondary | ICD-10-CM | POA: Diagnosis not present

## 2015-09-26 LAB — CBC WITH DIFFERENTIAL/PLATELET
Basophils Absolute: 0 10*3/uL (ref 0.0–0.1)
Basophils Relative: 0.7 % (ref 0.0–3.0)
Eosinophils Absolute: 0.2 10*3/uL (ref 0.0–0.7)
Eosinophils Relative: 2.9 % (ref 0.0–5.0)
HCT: 42.3 % (ref 36.0–46.0)
Hemoglobin: 14.3 g/dL (ref 12.0–15.0)
LYMPHS ABS: 2.2 10*3/uL (ref 0.7–4.0)
Lymphocytes Relative: 40.9 % (ref 12.0–46.0)
MCHC: 33.9 g/dL (ref 30.0–36.0)
MCV: 90.9 fl (ref 78.0–100.0)
MONOS PCT: 8 % (ref 3.0–12.0)
Monocytes Absolute: 0.4 10*3/uL (ref 0.1–1.0)
NEUTROS PCT: 47.5 % (ref 43.0–77.0)
Neutro Abs: 2.5 10*3/uL (ref 1.4–7.7)
Platelets: 317 10*3/uL (ref 150.0–400.0)
RBC: 4.66 Mil/uL (ref 3.87–5.11)
RDW: 13.6 % (ref 11.5–15.5)
WBC: 5.4 10*3/uL (ref 4.0–10.5)

## 2015-09-26 LAB — POCT URINALYSIS DIPSTICK
BILIRUBIN UA: NEGATIVE
Glucose, UA: NEGATIVE
Ketones, UA: NEGATIVE
LEUKOCYTES UA: NEGATIVE
Nitrite, UA: NEGATIVE
Protein, UA: NEGATIVE
Spec Grav, UA: >=1.03
Urobilinogen, UA: 0.2
pH, UA: 6

## 2015-09-26 LAB — COMPREHENSIVE METABOLIC PANEL
ALK PHOS: 58 U/L (ref 39–117)
ALT: 22 U/L (ref 0–35)
AST: 18 U/L (ref 0–37)
Albumin: 4.8 g/dL (ref 3.5–5.2)
BILIRUBIN TOTAL: 0.5 mg/dL (ref 0.2–1.2)
BUN: 22 mg/dL (ref 6–23)
CALCIUM: 9.7 mg/dL (ref 8.4–10.5)
CO2: 29 mEq/L (ref 19–32)
Chloride: 104 mEq/L (ref 96–112)
Creatinine, Ser: 0.74 mg/dL (ref 0.40–1.20)
GFR: 85.34 mL/min (ref >60.00)
GLUCOSE: 101 mg/dL — AB (ref 70–99)
Potassium: 3.8 mEq/L (ref 3.5–5.1)
Sodium: 141 mEq/L (ref 135–145)
Total Protein: 7 g/dL (ref 6.0–8.3)

## 2015-09-26 LAB — LIPID PANEL
CHOLESTEROL: 223 mg/dL — AB (ref 0–200)
HDL: 54 mg/dL (ref >39.00)
LDL Cholesterol: 139 mg/dL — ABNORMAL HIGH (ref 0–99)
NonHDL: 168.82
Total CHOL/HDL Ratio: 4
Triglycerides: 147 mg/dL (ref 0.0–149.0)
VLDL: 29.4 mg/dL (ref 0.0–40.0)

## 2015-09-26 LAB — TSH: TSH: 0.74 u[IU]/mL (ref 0.35–4.50)

## 2015-09-26 MED ORDER — VENLAFAXINE HCL ER 75 MG PO CP24: 75.0000 mg | ORAL_CAPSULE | Freq: Every day | ORAL | Status: DC

## 2015-09-26 NOTE — Patient Instructions (Signed)
Preventive Care for Adults, Female A healthy lifestyle and preventive care can promote health and wellness. Preventive health guidelines for women include the following key practices.  A routine yearly physical is a good way to check with your health care provider about your health and preventive screening. It is a chance to share any concerns and updates on your health and to receive a thorough exam.  Visit your dentist for a routine exam and preventive care every 6 months. Brush your teeth twice a day and floss once a day. Good oral hygiene prevents tooth decay and gum disease.  The frequency of eye exams is based on your age, health, family medical history, use of contact lenses, and other factors. Follow your health care provider's recommendations for frequency of eye exams.  Eat a healthy diet. Foods like vegetables, fruits, whole grains, low-fat dairy products, and lean protein foods contain the nutrients you need without too many calories. Decrease your intake of foods high in solid fats, added sugars, and salt. Eat the right amount of calories for you.Get information about a proper diet from your health care provider, if necessary.  Regular physical exercise is one of the most important things you can do for your health. Most adults should get at least 150 minutes of moderate-intensity exercise (any activity that increases your heart rate and causes you to sweat) each week. In addition, most adults need muscle-strengthening exercises on 2 or more days a week.  Maintain a healthy weight. The body mass index (BMI) is a screening tool to identify possible weight problems. It provides an estimate of body fat based on height and weight. Your health care provider can find your BMI and can help you achieve or maintain a healthy weight.For adults 20 years and older:  A BMI below 18.5 is considered underweight.  A BMI of 18.5 to 24.9 is normal.  A BMI of 25 to 29.9 is considered overweight.  A  BMI of 30 and above is considered obese.  Maintain normal blood lipids and cholesterol levels by exercising and minimizing your intake of saturated fat. Eat a balanced diet with plenty of fruit and vegetables. Blood tests for lipids and cholesterol should begin at age 45 and be repeated every 5 years. If your lipid or cholesterol levels are high, you are over 50, or you are at high risk for heart disease, you may need your cholesterol levels checked more frequently.Ongoing high lipid and cholesterol levels should be treated with medicines if diet and exercise are not working.  If you smoke, find out from your health care provider how to quit. If you do not use tobacco, do not start.  Lung cancer screening is recommended for adults aged 45-80 years who are at high risk for developing lung cancer because of a history of smoking. A yearly low-dose CT scan of the lungs is recommended for people who have at least a 30-pack-year history of smoking and are a current smoker or have quit within the past 15 years. A pack year of smoking is smoking an average of 1 pack of cigarettes a day for 1 year (for example: 1 pack a day for 30 years or 2 packs a day for 15 years). Yearly screening should continue until the smoker has stopped smoking for at least 15 years. Yearly screening should be stopped for people who develop a health problem that would prevent them from having lung cancer treatment.  If you are pregnant, do not drink alcohol. If you are  breastfeeding, be very cautious about drinking alcohol. If you are not pregnant and choose to drink alcohol, do not have more than 1 drink per day. One drink is considered to be 12 ounces (355 mL) of beer, 5 ounces (148 mL) of wine, or 1.5 ounces (44 mL) of liquor.  Avoid use of street drugs. Do not share needles with anyone. Ask for help if you need support or instructions about stopping the use of drugs.  High blood pressure causes heart disease and increases the risk  of stroke. Your blood pressure should be checked at least every 1 to 2 years. Ongoing high blood pressure should be treated with medicines if weight loss and exercise do not work.  If you are 55-79 years old, ask your health care provider if you should take aspirin to prevent strokes.  Diabetes screening is done by taking a blood sample to check your blood glucose level after you have not eaten for a certain period of time (fasting). If you are not overweight and you do not have risk factors for diabetes, you should be screened once every 3 years starting at age 45. If you are overweight or obese and you are 40-70 years of age, you should be screened for diabetes every year as part of your cardiovascular risk assessment.  Breast cancer screening is essential preventive care for women. You should practice "breast self-awareness." This means understanding the normal appearance and feel of your breasts and may include breast self-examination. Any changes detected, no matter how small, should be reported to a health care provider. Women in their 20s and 30s should have a clinical breast exam (CBE) by a health care provider as part of a regular health exam every 1 to 3 years. After age 40, women should have a CBE every year. Starting at age 40, women should consider having a mammogram (breast X-ray test) every year. Women who have a family history of breast cancer should talk to their health care provider about genetic screening. Women at a high risk of breast cancer should talk to their health care providers about having an MRI and a mammogram every year.  Breast cancer gene (BRCA)-related cancer risk assessment is recommended for women who have family members with BRCA-related cancers. BRCA-related cancers include breast, ovarian, tubal, and peritoneal cancers. Having family members with these cancers may be associated with an increased risk for harmful changes (mutations) in the breast cancer genes BRCA1 and  BRCA2. Results of the assessment will determine the need for genetic counseling and BRCA1 and BRCA2 testing.  Your health care provider may recommend that you be screened regularly for cancer of the pelvic organs (ovaries, uterus, and vagina). This screening involves a pelvic examination, including checking for microscopic changes to the surface of your cervix (Pap test). You may be encouraged to have this screening done every 3 years, beginning at age 21.  For women ages 30-65, health care providers may recommend pelvic exams and Pap testing every 3 years, or they may recommend the Pap and pelvic exam, combined with testing for human papilloma virus (HPV), every 5 years. Some types of HPV increase your risk of cervical cancer. Testing for HPV may also be done on women of any age with unclear Pap test results.  Other health care providers may not recommend any screening for nonpregnant women who are considered low risk for pelvic cancer and who do not have symptoms. Ask your health care provider if a screening pelvic exam is right for   you.  If you have had past treatment for cervical cancer or a condition that could lead to cancer, you need Pap tests and screening for cancer for at least 20 years after your treatment. If Pap tests have been discontinued, your risk factors (such as having a new sexual partner) need to be reassessed to determine if screening should resume. Some women have medical problems that increase the chance of getting cervical cancer. In these cases, your health care provider may recommend more frequent screening and Pap tests.  Colorectal cancer can be detected and often prevented. Most routine colorectal cancer screening begins at the age of 50 years and continues through age 75 years. However, your health care provider may recommend screening at an earlier age if you have risk factors for colon cancer. On a yearly basis, your health care provider may provide home test kits to check  for hidden blood in the stool. Use of a small camera at the end of a tube, to directly examine the colon (sigmoidoscopy or colonoscopy), can detect the earliest forms of colorectal cancer. Talk to your health care provider about this at age 50, when routine screening begins. Direct exam of the colon should be repeated every 5-10 years through age 75 years, unless early forms of precancerous polyps or small growths are found.  People who are at an increased risk for hepatitis B should be screened for this virus. You are considered at high risk for hepatitis B if:  You were born in a country where hepatitis B occurs often. Talk with your health care provider about which countries are considered high risk.  Your parents were born in a high-risk country and you have not received a shot to protect against hepatitis B (hepatitis B vaccine).  You have HIV or AIDS.  You use needles to inject street drugs.  You live with, or have sex with, someone who has hepatitis B.  You get hemodialysis treatment.  You take certain medicines for conditions like cancer, organ transplantation, and autoimmune conditions.  Hepatitis C blood testing is recommended for all people born from 1945 through 1965 and any individual with known risks for hepatitis C.  Practice safe sex. Use condoms and avoid high-risk sexual practices to reduce the spread of sexually transmitted infections (STIs). STIs include gonorrhea, chlamydia, syphilis, trichomonas, herpes, HPV, and human immunodeficiency virus (HIV). Herpes, HIV, and HPV are viral illnesses that have no cure. They can result in disability, cancer, and death.  You should be screened for sexually transmitted illnesses (STIs) including gonorrhea and chlamydia if:  You are sexually active and are younger than 24 years.  You are older than 24 years and your health care provider tells you that you are at risk for this type of infection.  Your sexual activity has changed  since you were last screened and you are at an increased risk for chlamydia or gonorrhea. Ask your health care provider if you are at risk.  If you are at risk of being infected with HIV, it is recommended that you take a prescription medicine daily to prevent HIV infection. This is called preexposure prophylaxis (PrEP). You are considered at risk if:  You are sexually active and do not regularly use condoms or know the HIV status of your partner(s).  You take drugs by injection.  You are sexually active with a partner who has HIV.  Talk with your health care provider about whether you are at high risk of being infected with HIV. If   you choose to begin PrEP, you should first be tested for HIV. You should then be tested every 3 months for as long as you are taking PrEP.  Osteoporosis is a disease in which the bones lose minerals and strength with aging. This can result in serious bone fractures or breaks. The risk of osteoporosis can be identified using a bone density scan. Women ages 67 years and over and women at risk for fractures or osteoporosis should discuss screening with their health care providers. Ask your health care provider whether you should take a calcium supplement or vitamin D to reduce the rate of osteoporosis.  Menopause can be associated with physical symptoms and risks. Hormone replacement therapy is available to decrease symptoms and risks. You should talk to your health care provider about whether hormone replacement therapy is right for you.  Use sunscreen. Apply sunscreen liberally and repeatedly throughout the day. You should seek shade when your shadow is shorter than you. Protect yourself by wearing long sleeves, pants, a wide-brimmed hat, and sunglasses year round, whenever you are outdoors.  Once a month, do a whole body skin exam, using a mirror to look at the skin on your back. Tell your health care provider of new moles, moles that have irregular borders, moles that  are larger than a pencil eraser, or moles that have changed in shape or color.  Stay current with required vaccines (immunizations).  Influenza vaccine. All adults should be immunized every year.  Tetanus, diphtheria, and acellular pertussis (Td, Tdap) vaccine. Pregnant women should receive 1 dose of Tdap vaccine during each pregnancy. The dose should be obtained regardless of the length of time since the last dose. Immunization is preferred during the 27th-36th week of gestation. An adult who has not previously received Tdap or who does not know her vaccine status should receive 1 dose of Tdap. This initial dose should be followed by tetanus and diphtheria toxoids (Td) booster doses every 10 years. Adults with an unknown or incomplete history of completing a 3-dose immunization series with Td-containing vaccines should begin or complete a primary immunization series including a Tdap dose. Adults should receive a Td booster every 10 years.  Varicella vaccine. An adult without evidence of immunity to varicella should receive 2 doses or a second dose if she has previously received 1 dose. Pregnant females who do not have evidence of immunity should receive the first dose after pregnancy. This first dose should be obtained before leaving the health care facility. The second dose should be obtained 4-8 weeks after the first dose.  Human papillomavirus (HPV) vaccine. Females aged 13-26 years who have not received the vaccine previously should obtain the 3-dose series. The vaccine is not recommended for use in pregnant females. However, pregnancy testing is not needed before receiving a dose. If a female is found to be pregnant after receiving a dose, no treatment is needed. In that case, the remaining doses should be delayed until after the pregnancy. Immunization is recommended for any person with an immunocompromised condition through the age of 61 years if she did not get any or all doses earlier. During the  3-dose series, the second dose should be obtained 4-8 weeks after the first dose. The third dose should be obtained 24 weeks after the first dose and 16 weeks after the second dose.  Zoster vaccine. One dose is recommended for adults aged 30 years or older unless certain conditions are present.  Measles, mumps, and rubella (MMR) vaccine. Adults born  before 1957 generally are considered immune to measles and mumps. Adults born in 1957 or later should have 1 or more doses of MMR vaccine unless there is a contraindication to the vaccine or there is laboratory evidence of immunity to each of the three diseases. A routine second dose of MMR vaccine should be obtained at least 28 days after the first dose for students attending postsecondary schools, health care workers, or international travelers. People who received inactivated measles vaccine or an unknown type of measles vaccine during 1963-1967 should receive 2 doses of MMR vaccine. People who received inactivated mumps vaccine or an unknown type of mumps vaccine before 1979 and are at high risk for mumps infection should consider immunization with 2 doses of MMR vaccine. For females of childbearing age, rubella immunity should be determined. If there is no evidence of immunity, females who are not pregnant should be vaccinated. If there is no evidence of immunity, females who are pregnant should delay immunization until after pregnancy. Unvaccinated health care workers born before 1957 who lack laboratory evidence of measles, mumps, or rubella immunity or laboratory confirmation of disease should consider measles and mumps immunization with 2 doses of MMR vaccine or rubella immunization with 1 dose of MMR vaccine.  Pneumococcal 13-valent conjugate (PCV13) vaccine. When indicated, a person who is uncertain of his immunization history and has no record of immunization should receive the PCV13 vaccine. All adults 65 years of age and older should receive this  vaccine. An adult aged 19 years or older who has certain medical conditions and has not been previously immunized should receive 1 dose of PCV13 vaccine. This PCV13 should be followed with a dose of pneumococcal polysaccharide (PPSV23) vaccine. Adults who are at high risk for pneumococcal disease should obtain the PPSV23 vaccine at least 8 weeks after the dose of PCV13 vaccine. Adults older than 59 years of age who have normal immune system function should obtain the PPSV23 vaccine dose at least 1 year after the dose of PCV13 vaccine.  Pneumococcal polysaccharide (PPSV23) vaccine. When PCV13 is also indicated, PCV13 should be obtained first. All adults aged 65 years and older should be immunized. An adult younger than age 65 years who has certain medical conditions should be immunized. Any person who resides in a nursing home or long-term care facility should be immunized. An adult smoker should be immunized. People with an immunocompromised condition and certain other conditions should receive both PCV13 and PPSV23 vaccines. People with human immunodeficiency virus (HIV) infection should be immunized as soon as possible after diagnosis. Immunization during chemotherapy or radiation therapy should be avoided. Routine use of PPSV23 vaccine is not recommended for American Indians, [place] Natives, or people younger than 65 years unless there are medical conditions that require PPSV23 vaccine. When indicated, people who have unknown immunization and have no record of immunization should receive PPSV23 vaccine. One-time revaccination 5 years after the first dose of PPSV23 is recommended for people aged 19-64 years who have chronic kidney failure, nephrotic syndrome, asplenia, or immunocompromised conditions. People who received 1-2 doses of PPSV23 before age 65 years should receive another dose of PPSV23 vaccine at age 65 years or later if at least 5 years have passed since the previous dose. Doses of PPSV23 are not  needed for people immunized with PPSV23 at or after age 65 years.  Meningococcal vaccine. Adults with asplenia or persistent complement component deficiencies should receive 2 doses of quadrivalent meningococcal conjugate (MenACWY-D) vaccine. The doses should be obtained   at least 2 months apart. Microbiologists working with certain meningococcal bacteria, [place] recruits, people at risk during an outbreak, and people who travel to or live in countries with a high rate of meningitis should be immunized. A first-year college student up through age 34 years who is living in a residence hall should receive a dose if she did not receive a dose on or after her 16th birthday. Adults who have certain high-risk conditions should receive one or more doses of vaccine.  Hepatitis A vaccine. Adults who wish to be protected from this disease, have certain high-risk conditions, work with hepatitis A-infected animals, work in hepatitis A research labs, or travel to or work in countries with a high rate of hepatitis A should be immunized. Adults who were previously unvaccinated and who anticipate close contact with an international adoptee during the first 60 days after arrival in the [place] from a country with a high rate of hepatitis A should be immunized.  Hepatitis B vaccine. Adults who wish to be protected from this disease, have certain high-risk conditions, may be exposed to blood or other infectious body fluids, are household contacts or sex partners of hepatitis B positive people, are clients or workers in certain care facilities, or travel to or work in countries with a high rate of hepatitis B should be immunized.  Haemophilus influenzae type b (Hib) vaccine. A previously unvaccinated person with asplenia or sickle cell disease or having a scheduled splenectomy should receive 1 dose of Hib vaccine. Regardless of previous immunization, a recipient of a hematopoietic stem cell transplant should receive a  3-dose series 6-12 months after her successful transplant. Hib vaccine is not recommended for adults with HIV infection. Preventive Services / Frequency Ages 35 to 4 years  Blood pressure check.** / Every 3-5 years.  Lipid and cholesterol check.** / Every 5 years beginning at age 60.  Clinical breast exam.** / Every 3 years for women in their 71s and 10s.  BRCA-related cancer risk assessment.** / For women who have family members with a BRCA-related cancer (breast, ovarian, tubal, or peritoneal cancers).  Pap test.** / Every 2 years from ages 76 through 26. Every 3 years starting at age 61 through age 76 or 93 with a history of 3 consecutive normal Pap tests.  HPV screening.** / Every 3 years from ages 37 through ages 60 to 51 with a history of 3 consecutive normal Pap tests.  Hepatitis C blood test.** / For any individual with known risks for hepatitis C.  Skin self-exam. / Monthly.  Influenza vaccine. / Every year.  Tetanus, diphtheria, and acellular pertussis (Tdap, Td) vaccine.** / Consult your health care provider. Pregnant women should receive 1 dose of Tdap vaccine during each pregnancy. 1 dose of Td every 10 years.  Varicella vaccine.** / Consult your health care provider. Pregnant females who do not have evidence of immunity should receive the first dose after pregnancy.  HPV vaccine. / 3 doses over 6 months, if 93 and younger. The vaccine is not recommended for use in pregnant females. However, pregnancy testing is not needed before receiving a dose.  Measles, mumps, rubella (MMR) vaccine.** / You need at least 1 dose of MMR if you were born in 1957 or later. You may also need a 2nd dose. For females of childbearing age, rubella immunity should be determined. If there is no evidence of immunity, females who are not pregnant should be vaccinated. If there is no evidence of immunity, females who are  pregnant should delay immunization until after pregnancy.  Pneumococcal  13-valent conjugate (PCV13) vaccine.** / Consult your health care provider.  Pneumococcal polysaccharide (PPSV23) vaccine.** / 1 to 2 doses if you smoke cigarettes or if you have certain conditions.  Meningococcal vaccine.** / 1 dose if you are age 68 to 8 years and a Market researcher living in a residence hall, or have one of several medical conditions, you need to get vaccinated against meningococcal disease. You may also need additional booster doses.  Hepatitis A vaccine.** / Consult your health care provider.  Hepatitis B vaccine.** / Consult your health care provider.  Haemophilus influenzae type b (Hib) vaccine.** / Consult your health care provider. Ages 7 to 53 years  Blood pressure check.** / Every year.  Lipid and cholesterol check.** / Every 5 years beginning at age 25 years.  Lung cancer screening. / Every year if you are aged 11-80 years and have a 30-pack-year history of smoking and currently smoke or have quit within the past 15 years. Yearly screening is stopped once you have quit smoking for at least 15 years or develop a health problem that would prevent you from having lung cancer treatment.  Clinical breast exam.** / Every year after age 48 years.  BRCA-related cancer risk assessment.** / For women who have family members with a BRCA-related cancer (breast, ovarian, tubal, or peritoneal cancers).  Mammogram.** / Every year beginning at age 41 years and continuing for as long as you are in good health. Consult with your health care provider.  Pap test.** / Every 3 years starting at age 65 years through age 37 or 70 years with a history of 3 consecutive normal Pap tests.  HPV screening.** / Every 3 years from ages 72 years through ages 60 to 40 years with a history of 3 consecutive normal Pap tests.  Fecal occult blood test (FOBT) of stool. / Every year beginning at age 21 years and continuing until age 5 years. You may not need to do this test if you get  a colonoscopy every 10 years.  Flexible sigmoidoscopy or colonoscopy.** / Every 5 years for a flexible sigmoidoscopy or every 10 years for a colonoscopy beginning at age 35 years and continuing until age 48 years.  Hepatitis C blood test.** / For all people born from [birth year] through 1965 and any individual with known risks for hepatitis C.  Skin self-exam. / Monthly.  Influenza vaccine. / Every year.  Tetanus, diphtheria, and acellular pertussis (Tdap/Td) vaccine.** / Consult your health care provider. Pregnant women should receive 1 dose of Tdap vaccine during each pregnancy. 1 dose of Td every 10 years.  Varicella vaccine.** / Consult your health care provider. Pregnant females who do not have evidence of immunity should receive the first dose after pregnancy.  Zoster vaccine.** / 1 dose for adults aged 30 years or older.  Measles, mumps, rubella (MMR) vaccine.** / You need at least 1 dose of MMR if you were born in 1957 or later. You may also need a second dose. For females of childbearing age, rubella immunity should be determined. If there is no evidence of immunity, females who are not pregnant should be vaccinated. If there is no evidence of immunity, females who are pregnant should delay immunization until after pregnancy.  Pneumococcal 13-valent conjugate (PCV13) vaccine.** / Consult your health care provider.  Pneumococcal polysaccharide (PPSV23) vaccine.** / 1 to 2 doses if you smoke cigarettes or if you have certain conditions.  Meningococcal vaccine.** /  Consult your health care provider.  Hepatitis A vaccine.** / Consult your health care provider.  Hepatitis B vaccine.** / Consult your health care provider.  Haemophilus influenzae type b (Hib) vaccine.** / Consult your health care provider. Ages 64 years and over  Blood pressure check.** / Every year.  Lipid and cholesterol check.** / Every 5 years beginning at age 23 years.  Lung cancer screening. / Every year if you  are aged 16-80 years and have a 30-pack-year history of smoking and currently smoke or have quit within the past 15 years. Yearly screening is stopped once you have quit smoking for at least 15 years or develop a health problem that would prevent you from having lung cancer treatment.  Clinical breast exam.** / Every year after age 74 years.  BRCA-related cancer risk assessment.** / For women who have family members with a BRCA-related cancer (breast, ovarian, tubal, or peritoneal cancers).  Mammogram.** / Every year beginning at age 44 years and continuing for as long as you are in good health. Consult with your health care provider.  Pap test.** / Every 3 years starting at age 58 years through age 22 or 39 years with 3 consecutive normal Pap tests. Testing can be stopped between 65 and 70 years with 3 consecutive normal Pap tests and no abnormal Pap or HPV tests in the past 10 years.  HPV screening.** / Every 3 years from ages 64 years through ages 70 or 61 years with a history of 3 consecutive normal Pap tests. Testing can be stopped between 65 and 70 years with 3 consecutive normal Pap tests and no abnormal Pap or HPV tests in the past 10 years.  Fecal occult blood test (FOBT) of stool. / Every year beginning at age 40 years and continuing until age 27 years. You may not need to do this test if you get a colonoscopy every 10 years.  Flexible sigmoidoscopy or colonoscopy.** / Every 5 years for a flexible sigmoidoscopy or every 10 years for a colonoscopy beginning at age 7 years and continuing until age 32 years.  Hepatitis C blood test.** / For all people born from [birth year] through 1964 and any individual with known risks for hepatitis C.  Osteoporosis screening.** / A one-time screening for women ages 30 years and over and women at risk for fractures or osteoporosis.  Skin self-exam. / Monthly.  Influenza vaccine. / Every year.  Tetanus, diphtheria, and acellular pertussis (Tdap/Td)  vaccine.** / 1 dose of Td every 10 years.  Varicella vaccine.** / Consult your health care provider.  Zoster vaccine.** / 1 dose for adults aged 35 years or older.  Pneumococcal 13-valent conjugate (PCV13) vaccine.** / Consult your health care provider.  Pneumococcal polysaccharide (PPSV23) vaccine.** / 1 dose for all adults aged 46 years and older.  Meningococcal vaccine.** / Consult your health care provider.  Hepatitis A vaccine.** / Consult your health care provider.  Hepatitis B vaccine.** / Consult your health care provider.  Haemophilus influenzae type b (Hib) vaccine.** / Consult your health care provider. ** Family history and personal history of risk and conditions may change your health care provider's recommendations.   This information is not intended to replace advice given to you by your health care provider. Make sure you discuss any questions you have with your health care provider.   Document Released: 06/25/2001 Document Revised: 05/20/2014 Document Reviewed: 09/24/2010 Elsevier Interactive Patient Education [employer].

## 2015-09-26 NOTE — Progress Notes (Signed)
Subjective:     Michelle Pratt is a 60 y.o. female and is here for a comprehensive physical exam. The patient reports no problems.  Social History   Social History  . Marital Status: Married    Spouse Name: N/A  . Number of Children: N/A  . 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 Smoking status: Former Games developermoker  . Smokeless tobacco: Never Used     Comment: over 20 years ago  . Alcohol Use: 1.8 oz/week    3 Glasses of wine per week  . Drug Use: No  . Sexual Activity:    Partners: Male   Other Topics Concern  . Not on file   Social History Narrative   Exercise-- water aerobics    Caffeine Use: daily 1 cup of coffee   Health Maintenance  Topic Date Due  . Hepatitis C Screening  1955/11/24  . MAMMOGRAM  05/20/2013  . PAP SMEAR  03/12/2015  . INFLUENZA VACCINE  12/12/2015  . COLONOSCOPY  11/04/2020  . TETANUS/TDAP  09/04/2024  . HIV Screening  Addressed    The following portions of the patient's history were reviewed and updated as appropriate:  She  has a past medical history of Thyroid disease and Menopause. She  does not have any pertinent problems on file. She  has past surgical history that includes Tonsillectomy; Tubal ligation; oraf; and ORIF elbow fracture. Her family history includes Alcohol abuse in her mother; Cancer (age of onset: 7489) in her father; Hypertension in her father; Parkinsonism in her mother; Stroke in her brother. She  reports that she has quit smoking. She has never used smokeless tobacco. She reports that she drinks about 1.8 oz of alcohol per week. She reports that she does not use illicit drugs. She has a current medication list which includes the following prescription(s): epinephrine, levothyroxine, naproxen sodium, and venlafaxine xr. Current Outpatient Prescriptions on File Prior to Visit  Medication Sig Dispense Refill  . EPINEPHrine (EPIPEN) 0.3 mg/0.3 mL IJ SOAJ  injection Inject 0.3 mLs (0.3 mg total) into the muscle once as needed (allergic reaction). 1 Device 0  . levothyroxine (SYNTHROID, LEVOTHROID) 112 MCG tablet Take 1 tablet (112 mcg total) by mouth daily. TSH DUE NOW 30 tablet 0  . naproxen sodium (ANAPROX) 220 MG tablet Take 220 mg by mouth as needed.     No current facility-administered medications on file prior to visit.   She is allergic to bee venom and morphine..  Review of Systems Review of Systems  Constitutional: Negative for activity change, appetite change and fatigue.  HENT: Negative for hearing loss, congestion, tinnitus and ear discharge.  dentist q543m Eyes: Negative for visual disturbance (see optho q1y -- vision corrected to 20/20 with glasses).  Respiratory: Negative for cough, chest tightness and shortness of breath.   Cardiovascular: Negative for chest pain, palpitations and leg swelling.  Gastrointestinal: Negative for abdominal pain, diarrhea, constipation and abdominal distention.  Genitourinary: Negative for urgency, frequency, decreased urine volume and difficulty urinating.  Musculoskeletal: Negative for back pain, arthralgias and gait problem.  Skin: Negative for color change, pallor and rash.  Neurological: Negative for dizziness, light-headedness, numbness and headaches.  Hematological: Negative for adenopathy. Does not bruise/bleed easily.  Psychiatric/Behavioral: Negative for suicidal ideas, confusion, sleep disturbance, self-injury, dysphoric mood, decreased concentration and agitation.       Objective:    BP 100/80 mmHg  Pulse 61  Temp(Src) 98.1 F (36.7 C) (Oral)  Ht  (1.778 m)  Wt 202 lb 3.2 oz (91.717 kg)  BMI 29.01 kg/m2  SpO2 97% General appearance: alert, cooperative, appears stated age and no distress Head: Normocephalic, without obvious abnormality, atraumatic Eyes: conjunctivae/corneas clear. PERRL, EOM's intact. Fundi benign. Ears: normal TM's and external ear canals both  ears Nose: Nares normal. Septum midline. Mucosa normal. No drainage or sinus tenderness. Throat: lips, mucosa, and tongue normal; teeth and gums normal Neck: no adenopathy, no carotid bruit, no JVD, supple, symmetrical, trachea midline and thyroid not enlarged, symmetric, no tenderness/mass/nodules Back: symmetric, no curvature. ROM normal. No CVA tenderness. Lungs: clear to auscultation bilaterally Breasts: normal appearance, no masses or tenderness Heart: regular rate and rhythm, S1, S2 normal, no murmur, click, rub or gallop Abdomen: soft, non-tender; bowel sounds normal; no masses,  no organomegaly Pelvic: cervix normal in appearance, external genitalia normal, no adnexal masses or tenderness, no cervical motion tenderness, rectovaginal septum normal, uterus normal size, shape, and consistency, vagina normal without discharge and pap done, rectal heme neg brown stool Extremities: extremities normal, atraumatic, no cyanosis or edema Pulses: 2+ and symmetric Skin: Skin color, texture, turgor normal. No rashes or lesions Lymph nodes: Cervical, supraclavicular, and axillary nodes normal. Neurologic: Alert and oriented X 3, normal strength and tone. Normal symmetric reflexes. Normal coordination and gait    Assessment:    Healthy female exam.     Plan:    ghm utd Check labs See After Visit Summary for Counseling Recommendations    1. Preventative health care See above - Comprehensive metabolic panel - CBC with Differential/Platelet - Lipid panel - POCT urinalysis dipstick  2. Depression stable - venlafaxine XR (EFFEXOR XR) 75 MG 24 hr capsule; Take 1 capsule (75 mg total) by mouth daily with breakfast.  Dispense: 30 capsule; Refill: 2  3. Hypothyroidism, unspecified hypothyroidism type  - TSH  4. Dizzy Pt concerned because of fam hx of carotid artery disease - US Carotid Duplex Bilateral; Future  5. Near syncope Not recent---  - US Carotid Duplex Bilateral; Future  6.  Need for hepatitis C screening test   - Hepatitis C antibody  7. Breast cancer screening   - MM DIGITAL SCREENING BILATERAL; Future

## 2015-09-26 NOTE — Progress Notes (Signed)
Pre visit review using our clinic review tool, if applicable. No additional management support is needed unless otherwise documented below in the visit note. 

## 2015-09-27 LAB — HEPATITIS C ANTIBODY: HCV AB: NEGATIVE

## 2015-09-27 LAB — CYTOLOGY - PAP

## 2015-09-28 LAB — URINE CULTURE
COLONY COUNT: NO GROWTH
Organism ID, Bacteria: NO GROWTH

## 2015-10-03 NOTE — Addendum Note (Signed)
Addended by: Erin HearingHEEK, Zacari Stiff N on: 10/03/2015 08:32 AM   Modules accepted: Orders, SmartSet

## 2015-10-05 ENCOUNTER — Ambulatory Visit (HOSPITAL_BASED_OUTPATIENT_CLINIC_OR_DEPARTMENT_OTHER)
Admission: RE | Admit: 2015-10-05 | Discharge: 2015-10-05 | Disposition: A | Discharge status: Home / Self Care | Payer: 59 | Source: Ambulatory Visit | Attending: Family Medicine | Admitting: Family Medicine

## 2015-10-05 ENCOUNTER — Other Ambulatory Visit: Payer: Self-pay | Admitting: Family Medicine

## 2015-10-05 DIAGNOSIS — R55 Syncope and collapse: Secondary | ICD-10-CM | POA: Insufficient documentation

## 2015-10-05 DIAGNOSIS — I6523 Occlusion and stenosis of bilateral carotid arteries: Secondary | ICD-10-CM | POA: Diagnosis not present

## 2015-10-05 DIAGNOSIS — Z1231 Encounter for screening mammogram for malignant neoplasm of breast: Secondary | ICD-10-CM | POA: Insufficient documentation

## 2015-10-05 DIAGNOSIS — R42 Dizziness and giddiness: Secondary | ICD-10-CM | POA: Insufficient documentation

## 2015-10-05 DIAGNOSIS — Z1239 Encounter for other screening for malignant neoplasm of breast: Secondary | ICD-10-CM | POA: Diagnosis not present

## 2015-10-11 ENCOUNTER — Other Ambulatory Visit: Payer: Self-pay | Admitting: Family Medicine

## 2015-10-11 MED FILL — VENLAFAXINE HCL ER 75 MG CA: 75 | 30 days supply | Qty: 30 | Fill #0

## 2015-10-11 NOTE — Telephone Encounter (Signed)
Rx sent to the pharmacy by e-script.//AB/CMA 

## 2015-10-12 ENCOUNTER — Other Ambulatory Visit: Payer: Self-pay | Admitting: Gynecologic Oncology

## 2015-10-12 ENCOUNTER — Telehealth: Payer: Self-pay | Admitting: Family Medicine

## 2015-10-12 DIAGNOSIS — N3090 Cystitis, unspecified without hematuria: Secondary | ICD-10-CM

## 2015-10-12 HISTORY — DX: Cystitis, unspecified without hematuria: N30.90

## 2015-10-12 MED ORDER — CIPROFLOXACIN HCL 250 MG PO TABS: 250.0000 mg | ORAL_TABLET | Freq: Two times a day (BID) | ORAL | Status: DC

## 2015-10-12 NOTE — Telephone Encounter (Signed)
Patient called stating that she has blood in her urine. Transferred to [facility]. Spoke with Alcario DroughtErica.

## 2015-10-13 NOTE — Telephone Encounter (Signed)
Called pt, she was seen by a provider at work and was given rx for cipro. States she already feels much better, but is having some diarrhea from the cipro and asked for recommendations. Advised pt to drink plenty of fluids to stay hydrated, and she could try OTC Immodium or probiotic for symptoms. Advised pt to follow up in office if any further symptoms or concerns and she agreed.

## 2015-10-16 MED FILL — LEVOTHYROXINE 112 MCG TAB: 112 | 30 days supply | Qty: 30 | Fill #0

## 2015-11-08 MED FILL — LEVOTHYROXINE 112 MCG TAB: 112 | 30 days supply | Qty: 30 | Fill #1

## 2015-11-17 MED FILL — VENLAFAXINE HCL ER 75 MG CA: 75 | 30 days supply | Qty: 30 | Fill #1

## 2015-12-07 MED FILL — LEVOTHYROXINE 112 MCG TAB: 112 | 30 days supply | Qty: 30 | Fill #2

## 2015-12-18 MED FILL — VENLAFAXINE HCL ER 75 MG CA: 75 | 30 days supply | Qty: 30 | Fill #2

## 2016-01-04 MED FILL — LEVOTHYROXINE 112 MCG TAB: 112 | 30 days supply | Qty: 30 | Fill #3

## 2016-01-11 ENCOUNTER — Other Ambulatory Visit: Payer: Self-pay | Admitting: Family Medicine

## 2016-01-11 DIAGNOSIS — F32A Depression, unspecified: Secondary | ICD-10-CM

## 2016-01-11 DIAGNOSIS — F329 Major depressive disorder, single episode, unspecified: Secondary | ICD-10-CM

## 2016-01-11 MED ORDER — VENLAFAXINE HCL ER 75 MG PO CP24: 75.0000 mg | ORAL_CAPSULE | Freq: Every day | ORAL | 8 refills | Status: DC

## 2016-01-11 NOTE — Telephone Encounter (Signed)
Relation to WU:JWJXpt:self Call back number:[phone] [facility] [place] - [address] [phone] (Phone) [phone])     Reason for call:  Patient requesting a refill venlafaxine XR (EFFEXOR XR) 75 MG 24 hr capsule

## 2016-01-11 NOTE — Telephone Encounter (Signed)
Rx sent 

## 2016-01-12 MED FILL — VENLAFAXINE HCL ER 75 MG CA: 75 | 30 days supply | Qty: 30 | Fill #0

## 2016-02-12 MED FILL — LEVOTHYROXINE 112 MCG TAB: 112 | 30 days supply | Qty: 30 | Fill #4

## 2016-02-17 MED FILL — VENLAFAXINE HCL ER 75 MG CA: 75 | 30 days supply | Qty: 30 | Fill #1

## 2016-03-19 MED FILL — LEVOTHYROXINE 112 MCG TAB: 112 | 30 days supply | Qty: 30 | Fill #5

## 2016-03-19 MED FILL — VENLAFAXINE HCL ER 75 MG CA: 75 | 30 days supply | Qty: 30 | Fill #2

## 2016-04-17 MED FILL — VENLAFAXINE HCL ER 75 MG CA: 75 | 30 days supply | Qty: 30 | Fill #3

## 2016-04-23 ENCOUNTER — Other Ambulatory Visit: Payer: Self-pay | Admitting: Family Medicine

## 2016-04-23 MED FILL — LEVOTHYROXINE 112 MCG TAB: 112 | 30 days supply | Qty: 30 | Fill #0

## 2016-04-29 DIAGNOSIS — M25561 Pain in right knee: Secondary | ICD-10-CM | POA: Diagnosis not present

## 2016-05-20 MED FILL — VENLAFAXINE HCL ER 75 MG CA: 75 | 30 days supply | Qty: 30 | Fill #4

## 2016-05-21 MED FILL — LEVOTHYROXINE 112 MCG TAB: 112 | 30 days supply | Qty: 30 | Fill #1

## 2016-06-20 MED FILL — VENLAFAXINE HCL ER 75 MG CA: 75 | 30 days supply | Qty: 30 | Fill #5

## 2016-07-03 MED FILL — LEVOTHYROXINE 112 MCG TAB: 112 | 30 days supply | Qty: 30 | Fill #2

## 2016-07-22 MED FILL — VENLAFAXINE HCL ER 75 MG CA: 75 | 30 days supply | Qty: 30 | Fill #6

## 2016-08-01 MED FILL — LEVOTHYROXINE 112 MCG TAB: 112 | 30 days supply | Qty: 30 | Fill #3

## 2016-08-16 MED FILL — VENLAFAXINE HCL ER 75 MG CA: 75 | 30 days supply | Qty: 30 | Fill #7

## 2016-09-02 MED FILL — LEVOTHYROXINE 112 MCG TAB: 112 | 30 days supply | Qty: 30 | Fill #4

## 2016-09-19 MED FILL — VENLAFAXINE HCL ER 75 MG CA: 75 | 30 days supply | Qty: 30 | Fill #8

## 2016-09-26 ENCOUNTER — Encounter: Payer: 59 | Admitting: Family Medicine

## 2016-10-08 MED FILL — LEVOTHYROXINE 112 MCG TAB: 112 | 30 days supply | Qty: 30 | Fill #5

## 2016-10-21 ENCOUNTER — Encounter: Payer: 59 | Admitting: Family Medicine

## 2016-10-21 ENCOUNTER — Other Ambulatory Visit: Payer: Self-pay | Admitting: Family Medicine

## 2016-10-21 DIAGNOSIS — F329 Major depressive disorder, single episode, unspecified: Secondary | ICD-10-CM

## 2016-10-21 DIAGNOSIS — F32A Depression, unspecified: Secondary | ICD-10-CM

## 2016-10-21 MED FILL — VENLAFAXINE HCL ER 75 MG CA: 75 | 30 days supply | Qty: 30 | Fill #0

## 2016-11-05 ENCOUNTER — Other Ambulatory Visit: Payer: Self-pay | Admitting: Family Medicine

## 2016-11-05 DIAGNOSIS — Z1231 Encounter for screening mammogram for malignant neoplasm of breast: Secondary | ICD-10-CM

## 2016-11-06 MED FILL — LEVOTHYROXINE 112 MCG TAB: 112 | 30 days supply | Qty: 30 | Fill #0

## 2016-11-08 ENCOUNTER — Ambulatory Visit (INDEPENDENT_AMBULATORY_CARE_PROVIDER_SITE_OTHER): Payer: 59 | Admitting: Family Medicine

## 2016-11-08 ENCOUNTER — Ambulatory Visit (HOSPITAL_BASED_OUTPATIENT_CLINIC_OR_DEPARTMENT_OTHER)
Admission: RE | Admit: 2016-11-08 | Discharge: 2016-11-08 | Disposition: A | Discharge status: Home / Self Care | Payer: 59 | Source: Ambulatory Visit | Attending: Family Medicine | Admitting: Family Medicine

## 2016-11-08 ENCOUNTER — Encounter: Payer: Self-pay | Admitting: Family Medicine

## 2016-11-08 VITALS — BP 136/80 | HR 72 | Temp 98.0°F | Resp 16 | Ht 69.5 in | Wt 199.4 lb

## 2016-11-08 DIAGNOSIS — Z0000 Encounter for general adult medical examination without abnormal findings: Secondary | ICD-10-CM

## 2016-11-08 DIAGNOSIS — E039 Hypothyroidism, unspecified: Secondary | ICD-10-CM | POA: Diagnosis not present

## 2016-11-08 DIAGNOSIS — F419 Anxiety disorder, unspecified: Secondary | ICD-10-CM

## 2016-11-08 DIAGNOSIS — Z1231 Encounter for screening mammogram for malignant neoplasm of breast: Secondary | ICD-10-CM | POA: Diagnosis not present

## 2016-11-08 LAB — POC URINALSYSI DIPSTICK (AUTOMATED)
Bilirubin, UA: NEGATIVE
Blood, UA: NEGATIVE
Glucose, UA: NEGATIVE
KETONES UA: NEGATIVE
Leukocytes, UA: NEGATIVE
Nitrite, UA: NEGATIVE
PH UA: 6 (ref 5.0–8.0)
PROTEIN UA: NEGATIVE
Spec Grav, UA: 1.02 (ref 1.010–1.025)
Urobilinogen, UA: 0.2 E.U./dL

## 2016-11-08 MED ORDER — VENLAFAXINE HCL ER 37.5 MG PO CP24: 37.5000 mg | ORAL_CAPSULE | Freq: Every day | ORAL | 2 refills | Status: DC

## 2016-11-08 NOTE — Patient Instructions (Signed)
Preventive Care 40-64 Years, Female Preventive care refers to lifestyle choices and visits with your health care provider that can promote health and wellness. What does preventive care include?  A yearly physical exam. This is also called an annual well check.  Dental exams once or twice a year.  Routine eye exams. Ask your health care provider how often you should have your eyes checked.  Personal lifestyle choices, including: ? Daily care of your teeth and gums. ? Regular physical activity. ? Eating a healthy diet. ? Avoiding tobacco and drug use. ? Limiting alcohol use. ? Practicing safe sex. ? Taking low-dose aspirin daily starting at age 58. ? Taking vitamin and mineral supplements as recommended by your health care provider. What happens during an annual well check? The services and screenings done by your health care provider during your annual well check will depend on your age, overall health, lifestyle risk factors, and family history of disease. Counseling Your health care provider may ask you questions about your:  Alcohol use.  Tobacco use.  Drug use.  Emotional well-being.  Home and relationship well-being.  Sexual activity.  Eating habits.  Work and work Statistician.  Method of birth control.  Menstrual cycle.  Pregnancy history.  Screening You may have the following tests or measurements:  Height, weight, and BMI.  Blood pressure.  Lipid and cholesterol levels. These may be checked every 5 years, or more frequently if you are over 81 years old.  Skin check.  Lung cancer screening. You may have this screening every year starting at age 78 if you have a 30-pack-year history of smoking and currently smoke or have quit within the past 15 years.  Fecal occult blood test (FOBT) of the stool. You may have this test every year starting at age 65.  Flexible sigmoidoscopy or colonoscopy. You may have a sigmoidoscopy every 5 years or a colonoscopy  every 10 years starting at age 30.  Hepatitis C blood test.  Hepatitis B blood test.  Sexually transmitted disease (STD) testing.  Diabetes screening. This is done by checking your blood sugar (glucose) after you have not eaten for a while (fasting). You may have this done every 1-3 years.  Mammogram. This may be done every 1-2 years. Talk to your health care provider about when you should start having regular mammograms. This may depend on whether you have a family history of breast cancer.  BRCA-related cancer screening. This may be done if you have a family history of breast, ovarian, tubal, or peritoneal cancers.  Pelvic exam and Pap test. This may be done every 3 years starting at age 80. Starting at age 36, this may be done every 5 years if you have a Pap test in combination with an HPV test.  Bone density scan. This is done to screen for osteoporosis. You may have this scan if you are at high risk for osteoporosis.  Discuss your test results, treatment options, and if necessary, the need for more tests with your health care provider. Vaccines Your health care provider may recommend certain vaccines, such as:  Influenza vaccine. This is recommended every year.  Tetanus, diphtheria, and acellular pertussis (Tdap, Td) vaccine. You may need a Td booster every 10 years.  Varicella vaccine. You may need this if you have not been vaccinated.  Zoster vaccine. You may need this after age 5.  Measles, mumps, and rubella (MMR) vaccine. You may need at least one dose of MMR if you were born in  1957 or later. You may also need a second dose.  Pneumococcal 13-valent conjugate (PCV13) vaccine. You may need this if you have certain conditions and were not previously vaccinated.  Pneumococcal polysaccharide (PPSV23) vaccine. You may need one or two doses if you smoke cigarettes or if you have certain conditions.  Meningococcal vaccine. You may need this if you have certain  conditions.  Hepatitis A vaccine. You may need this if you have certain conditions or if you travel or work in places where you may be exposed to hepatitis A.  Hepatitis B vaccine. You may need this if you have certain conditions or if you travel or work in places where you may be exposed to hepatitis B.  Haemophilus influenzae type b (Hib) vaccine. You may need this if you have certain conditions.  Talk to your health care provider about which screenings and vaccines you need and how often you need them. This information is not intended to replace advice given to you by your health care provider. Make sure you discuss any questions you have with your health care provider. Document Released: 05/26/2015 Document Revised: 01/17/2016 Document Reviewed: 02/28/2015 Elsevier Interactive Patient Education  2017 [employer].

## 2016-11-08 NOTE — Progress Notes (Signed)
Subjective:   I acted as a Neurosurgeonscribe for Dr. Zola ButtonLowne-Chase.  Apolonio SchneidersSheketia, CMA   Michelle Pratt is a 61 y.o. female and is here for a comprehensive physical exam. The patient reports no problems.  Social History   Social History  . Marital status: Married    Spouse name: N/A  . Number of children: N/A  . 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 Smoking status: Former Games developermoker  . Smokeless tobacco: Never Used     Comment: over 20 years ago  . Alcohol use 1.8 oz/week    3 Glasses of wine per week  . Drug use: No  . Sexual activity: Yes    Partners: Male   Other Topics Concern  . Not on file   Social History Narrative   Exercise--some bike riding   Caffeine Use: daily 1 cup of coffee   Health Maintenance  Topic Date Due  . INFLUENZA VACCINE  12/11/2016  . MAMMOGRAM  11/08/2017  . PAP SMEAR  09/26/2018  . COLONOSCOPY  11/04/2020  . TETANUS/TDAP  09/04/2024  . Hepatitis C Screening  Completed  . HIV Screening  Completed    The following portions of the patient's history were reviewed and updated as appropriate:  She  has a past medical history of Menopause and Thyroid disease. She  does not have any pertinent problems on file. She  has a past surgical history that includes Tonsillectomy; Tubal ligation; oraf; and ORIF elbow fracture. Her family history includes Alcohol abuse in her mother; Cancer (age of onset: 7989) in her father; Hypertension in her father; Parkinsonism in her mother; Stroke in her brother. She  reports that she has quit smoking. She has never used smokeless tobacco. She reports that she drinks about 1.8 oz of alcohol per week . She reports that she does not use drugs. She has a current medication list which includes the following prescription(s): epinephrine, levothyroxine, naproxen sodium, and venlafaxine xr. Current Outpatient Prescriptions on File Prior to Visit  Medication Sig Dispense  Refill  . EPINEPHrine (EPIPEN) 0.3 mg/0.3 mL IJ SOAJ injection Inject 0.3 mLs (0.3 mg total) into the muscle once as needed (allergic reaction). 1 Device 0  . levothyroxine (SYNTHROID, LEVOTHROID) 112 MCG tablet TAKE 1 TABLET BY MOUTH ONCE DAILY 30 tablet 5  . naproxen sodium (ANAPROX) 220 MG tablet Take 220 mg by mouth as needed.     No current facility-administered medications on file prior to visit.    She is allergic to bee venom and morphine..  Review of Systems Review of Systems  Constitutional: Negative for activity change, appetite change and fatigue.  HENT: Negative for hearing loss, congestion, tinnitus and ear discharge.  dentist q6359m Eyes: Negative for visual disturbance (see optho q1y -- vision corrected to 20/20 with glasses).  Respiratory: Negative for cough, chest tightness and shortness of breath.   Cardiovascular: Negative for chest pain, palpitations and leg swelling.  Gastrointestinal: Negative for abdominal pain, diarrhea, constipation and abdominal distention.  Genitourinary: Negative for urgency, frequency, decreased urine volume and difficulty urinating.  Musculoskeletal: Negative for back pain, arthralgias and gait problem.  Skin: Negative for color change, pallor and rash.  Neurological: Negative for dizziness, light-headedness, numbness and headaches.  Hematological: Negative for adenopathy. Does not bruise/bleed easily.  Psychiatric/Behavioral: Negative for suicidal ideas, confusion, sleep disturbance, self-injury, dysphoric mood, decreased concentration and agitation.       Objective:  BP 136/80 (BP Location: Left Arm, Cuff Size: Normal)   Pulse 72   Temp 98 F (36.7 C) (Oral)   Resp 16   Ht 5' 9.5" (1.765 m)   Wt 199 lb 6.4 oz (90.4 kg)   SpO2 97%   BMI 29.02 kg/m  General appearance: alert, cooperative, appears stated age and no distress Head: Normocephalic, without obvious abnormality, atraumatic Eyes: conjunctivae/corneas clear. PERRL, EOM's  intact. Fundi benign. Ears: normal TM's and external ear canals both ears Nose: Nares normal. Septum midline. Mucosa normal. No drainage or sinus tenderness. Throat: lips, mucosa, and tongue normal; teeth and gums normal Neck: no adenopathy, no carotid bruit, no JVD, supple, symmetrical, trachea midline and thyroid not enlarged, symmetric, no tenderness/mass/nodules Back: symmetric, no curvature. ROM normal. No CVA tenderness. Lungs: clear to auscultation bilaterally Breasts: normal appearance, no masses or tenderness Heart: regular rate and rhythm, S1, S2 normal, no murmur, click, rub or gallop Abdomen: soft, non-tender; bowel sounds normal; no masses,  no organomegaly Pelvic: deferred Extremities: extremities normal, atraumatic, no cyanosis or edema Pulses: 2+ and symmetric Skin: Skin color, texture, turgor normal. No rashes or lesions Lymph nodes: Cervical, supraclavicular, and axillary nodes normal. Neurologic: Alert and oriented X 3, normal strength and tone. Normal symmetric reflexes. Normal coordination and gait    Assessment:    Healthy female exam.      Plan:    ghm utd Check labs See After Visit Summary for Counseling Recommendations    1. Preventative health care See above - POCT Urinalysis Dipstick (Automated) - CBC with Differential/Platelet; Future - Comprehensive metabolic panel; Future - Lipid panel; Future - TSH; Future  2. Hypothyroidism, unspecified type  - TSH; Future  3. Anxiety  - venlafaxine XR (EFFEXOR XR) 37.5 MG 24 hr capsule; Take 1 capsule (37.5 mg total) by mouth daily with breakfast.  Dispense: 30 capsule; Refill: 2

## 2016-11-12 ENCOUNTER — Other Ambulatory Visit (INDEPENDENT_AMBULATORY_CARE_PROVIDER_SITE_OTHER): Payer: 59

## 2016-11-12 DIAGNOSIS — E039 Hypothyroidism, unspecified: Secondary | ICD-10-CM

## 2016-11-12 DIAGNOSIS — Z0000 Encounter for general adult medical examination without abnormal findings: Secondary | ICD-10-CM

## 2016-11-12 LAB — CBC WITH DIFFERENTIAL/PLATELET
Basophils Absolute: 0 10*3/uL (ref 0.0–0.1)
Basophils Relative: 1.1 % (ref 0.0–3.0)
EOS ABS: 0.1 10*3/uL (ref 0.0–0.7)
EOS PCT: 2.8 % (ref 0.0–5.0)
HCT: 40.5 % (ref 36.0–46.0)
HEMOGLOBIN: 13.8 g/dL (ref 12.0–15.0)
Lymphocytes Relative: 37.3 % (ref 12.0–46.0)
Lymphs Abs: 1.6 10*3/uL (ref 0.7–4.0)
MCHC: 34.2 g/dL (ref 30.0–36.0)
MCV: 89.4 fl (ref 78.0–100.0)
MONO ABS: 0.3 10*3/uL (ref 0.1–1.0)
Monocytes Relative: 7.3 % (ref 3.0–12.0)
Neutro Abs: 2.2 10*3/uL (ref 1.4–7.7)
Neutrophils Relative %: 51.5 % (ref 43.0–77.0)
Platelets: 276 10*3/uL (ref 150.0–400.0)
RBC: 4.53 Mil/uL (ref 3.87–5.11)
RDW: 13.1 % (ref 11.5–15.5)
WBC: 4.4 10*3/uL (ref 4.0–10.5)

## 2016-11-12 LAB — COMPREHENSIVE METABOLIC PANEL
ALBUMIN: 4.2 g/dL (ref 3.5–5.2)
ALK PHOS: 49 U/L (ref 39–117)
ALT: 30 U/L (ref 0–35)
AST: 20 U/L (ref 0–37)
BILIRUBIN TOTAL: 0.5 mg/dL (ref 0.2–1.2)
BUN: 20 mg/dL (ref 6–23)
CO2: 29 mEq/L (ref 19–32)
CREATININE: 0.76 mg/dL (ref 0.40–1.20)
Calcium: 9.1 mg/dL (ref 8.4–10.5)
Chloride: 107 mEq/L (ref 96–112)
GFR: 82.44 mL/min (ref >60.00)
Glucose, Bld: 104 mg/dL — ABNORMAL HIGH (ref 70–99)
Potassium: 4.4 mEq/L (ref 3.5–5.1)
SODIUM: 142 meq/L (ref 135–145)
TOTAL PROTEIN: 6.5 g/dL (ref 6.0–8.3)

## 2016-11-12 LAB — LIPID PANEL
CHOLESTEROL: 199 mg/dL (ref 0–200)
HDL: 62.9 mg/dL (ref >39.00)
LDL Cholesterol: 121 mg/dL — ABNORMAL HIGH (ref 0–99)
NonHDL: 136.35
Total CHOL/HDL Ratio: 3
Triglycerides: 75 mg/dL (ref 0.0–149.0)
VLDL: 15 mg/dL (ref 0.0–40.0)

## 2016-11-12 LAB — TSH: TSH: 1.46 u[IU]/mL (ref 0.35–4.50)

## 2016-11-18 MED FILL — VENLAFAXINE HCL ER 37.5 MG: 37.5 | 30 days supply | Qty: 30 | Fill #0

## 2016-12-04 ENCOUNTER — Encounter: Payer: Self-pay | Admitting: Family Medicine

## 2016-12-04 MED ORDER — FLUOXETINE HCL 10 MG PO TABS: 10.0000 mg | ORAL_TABLET | Freq: Every day | ORAL | 2 refills | Status: DC

## 2016-12-04 MED FILL — LEVOTHYROXINE 112 MCG TAB: 112 | 30 days supply | Qty: 30 | Fill #1

## 2016-12-04 MED FILL — FLUoxetine HCL 10 MG TABS: 10 | 30 days supply | Qty: 30 | Fill #0

## 2016-12-04 NOTE — Telephone Encounter (Signed)
prozac 10 mg 1 po qd #30  2 refills  To help her get off effexor

## 2016-12-04 NOTE — Telephone Encounter (Signed)
Rx sent 

## 2016-12-05 ENCOUNTER — Encounter: Payer: Self-pay | Admitting: Family Medicine

## 2016-12-20 MED FILL — VENLAFAXINE HCL ER 37.5 MG: 37.5 | 30 days supply | Qty: 30 | Fill #1

## 2017-01-06 MED FILL — FLUoxetine HCL 10 MG TABS: 10 | 30 days supply | Qty: 30 | Fill #1

## 2017-01-06 MED FILL — LEVOTHYROXINE 112 MCG TAB: 112 | 30 days supply | Qty: 30 | Fill #2

## 2017-02-03 MED FILL — LEVOTHYROXINE 112 MCG TAB: 112 | 30 days supply | Qty: 30 | Fill #3

## 2017-02-03 MED FILL — FLUoxetine HCL 10 MG TABS: 10 | 30 days supply | Qty: 30 | Fill #2

## 2017-03-05 ENCOUNTER — Other Ambulatory Visit: Payer: Self-pay | Admitting: Family Medicine

## 2017-03-05 MED FILL — FLUoxetine HCL 10 MG TABS: 10 | 30 days supply | Qty: 30 | Fill #0

## 2017-03-05 MED FILL — LEVOTHYROXINE 112 MCG TAB: 112 | 30 days supply | Qty: 30 | Fill #4

## 2017-04-09 MED FILL — FLUoxetine HCL 10 MG TABS: 10 | 30 days supply | Qty: 30 | Fill #1

## 2017-04-09 MED FILL — LEVOTHYROXINE 112 MCG TAB: 112 | 30 days supply | Qty: 30 | Fill #5

## 2017-05-09 ENCOUNTER — Other Ambulatory Visit: Payer: Self-pay | Admitting: Family Medicine

## 2017-05-09 MED FILL — LEVOTHYROXINE 112 MCG TAB: 112 | 30 days supply | Qty: 30 | Fill #0

## 2017-05-13 HISTORY — PX: OTHER SURGICAL HISTORY: SHX169

## 2017-05-16 MED FILL — FLUoxetine HCL 10 MG TABS: 10 | 30 days supply | Qty: 30 | Fill #2

## 2017-06-05 ENCOUNTER — Ambulatory Visit (INDEPENDENT_AMBULATORY_CARE_PROVIDER_SITE_OTHER): Payer: 59

## 2017-06-05 ENCOUNTER — Encounter: Payer: Self-pay | Admitting: Podiatry

## 2017-06-05 ENCOUNTER — Ambulatory Visit: Payer: 59 | Admitting: Podiatry

## 2017-06-05 DIAGNOSIS — M79675 Pain in left toe(s): Secondary | ICD-10-CM

## 2017-06-05 DIAGNOSIS — M79674 Pain in right toe(s): Secondary | ICD-10-CM | POA: Diagnosis not present

## 2017-06-05 DIAGNOSIS — M205X2 Other deformities of toe(s) (acquired), left foot: Secondary | ICD-10-CM

## 2017-06-05 DIAGNOSIS — M21611 Bunion of right foot: Secondary | ICD-10-CM | POA: Diagnosis not present

## 2017-06-05 NOTE — Patient Instructions (Signed)
Pre-Operative Instructions  Congratulations, you have decided to take an important step towards improving your quality of life.  You can be assured that the doctors and staff at [facility] will be with you every step of the way.  Here are some important things you should know:  1. Plan to be at the surgery center/hospital at least 1 (one) hour prior to your scheduled time, unless otherwise directed by the surgical center/hospital staff.  You must have a responsible adult accompany you, remain during the surgery and drive you home.  Make sure you have directions to the surgical center/hospital to ensure you arrive on time. 2. If you are having surgery at [facility] or [facility], you will need a copy of your medical history and physical form from your family physician within one month prior to the date of surgery. We will give you a form for your primary physician to complete.  3. We make every effort to accommodate the date you request for surgery.  However, there are times where surgery dates or times have to be moved.  We will contact you as soon as possible if a change in schedule is required.   4. No aspirin/ibuprofen for one week before surgery.  If you are on aspirin, any non-steroidal anti-inflammatory medications (Mobic, Aleve, Ibuprofen) should not be taken seven (7) days prior to your surgery.  You make take Tylenol for pain prior to surgery.  5. Medications - If you are taking daily heart and blood pressure medications, seizure, reflux, allergy, asthma, anxiety, pain or diabetes medications, make sure you notify the surgery center/hospital before the day of surgery so they can tell you which medications you should take or avoid the day of surgery. 6. No food or drink after midnight the night before surgery unless directed otherwise by surgical center/hospital staff. 7. No alcoholic beverages 24-hours prior to surgery.  No smoking 24-hours prior or 24-hours after  surgery. 8. Wear loose pants or shorts. They should be loose enough to fit over bandages, boots, and casts. 9. Don't wear slip-on shoes. Sneakers are preferred. 10. Bring your boot with you to the surgery center/hospital.  Also bring crutches or a walker if your physician has prescribed it for you.  If you do not have this equipment, it will be provided for you after surgery. 11. If you have not been contacted by the surgery center/hospital by the day before your surgery, call to confirm the date and time of your surgery. 12. Leave-time from work may vary depending on the type of surgery you have.  Appropriate arrangements should be made prior to surgery with your employer. 13. Prescriptions will be provided immediately following surgery by your doctor.  Fill these as soon as possible after surgery and take the medication as directed. Pain medications will not be refilled on weekends and must be approved by the doctor. 14. Remove nail polish on the operative foot and avoid getting pedicures prior to surgery. 15. Wash the night before surgery.  The night before surgery wash the foot and leg well with water and the antibacterial soap provided. Be sure to pay special attention to beneath the toenails and in between the toes.  Wash for at least three (3) minutes. Rinse thoroughly with water and dry well with a towel.  Perform this wash unless told not to do so by your physician.  Enclosed: 1 Ice pack (please put in freezer the night before surgery)   1 Hibiclens skin cleaner     Pre-op instructions  If you have any questions regarding the instructions, please do not hesitate to call our office.  [place]: [address], [place], [place] [address] -- [phone]  [place]: [address] [place], [place] [address] -- [phone]  [place]: [address]  [place], [place] [address] -- [phone]  [place]: [address], [address], [place], [place] [address] -- [phone]  Website:  [email or web address] 

## 2017-06-07 NOTE — Progress Notes (Signed)
Subjective:   Patient ID: Vonzella Nipple, female   DOB: 62 y.o.   MRN: [id number]   HPI 62 year old female presents the office today with concerns of left painful bunion which is been ongoing for several years.  She states that she previously has broken the toe about 3 years ago and then again about 1.5 years ago.  She states it hurts to bend the toe when she cannot be active.  She states that she would like to do something in order to restore some motion to the toe.  She has tried changing shoes without any significant improvement.  She has no numbness or tingling.  She has no other concerns today.   Review of Systems  All other systems reviewed and are negative.  Past Medical History:  Diagnosis Date  . Menopause   . Thyroid disease    hypo    Past Surgical History:  Procedure Laterality Date  . oraf     left eyebrow  . ORIF ELBOW FRACTURE    . TONSILLECTOMY    . TUBAL LIGATION       Current Outpatient Medications:  .  EPINEPHrine (EPIPEN) 0.3 mg/0.3 mL IJ SOAJ injection, Inject 0.3 mLs (0.3 mg total) into the muscle once as needed (allergic reaction)., Disp: 1 Device, Rfl: 0 .  FLUoxetine (PROZAC) 10 MG tablet, TAKE 1 TABLET BY MOUTH DAILY., Disp: 30 tablet, Rfl: 2 .  levothyroxine (SYNTHROID, LEVOTHROID) 112 MCG tablet, TAKE 1 TABLET BY MOUTH ONCE DAILY, Disp: 30 tablet, Rfl: 5 .  naproxen sodium (ANAPROX) 220 MG tablet, Take 220 mg by mouth as needed., Disp: , Rfl:  .  venlafaxine XR (EFFEXOR XR) 37.5 MG 24 hr capsule, Take 1 capsule (37.5 mg total) by mouth daily with breakfast., Disp: 30 capsule, Rfl: 2  Allergies  Allergen Reactions  . Bee Venom Anaphylaxis and Swelling  . Morphine Nausea And Vomiting    Social History   Socioeconomic History  . Marital status: Married    Spouse name: Not on file  . Number of children: Not on file  . Years of education: Not on file  . Highest education level: Not on file  Social Needs  . Financial resource strain: Not on file   . Food insecurity - worry: Not on file  . Food insecurity - inability: Not on file  . Transportation needs - medical: Not on file  . Transportation needs - non-medical: Not on file  Occupational History  . Occupation: OR    Employer:  [employer]  Tobacco Use  . Smoking status: Former Games developer  . Smokeless tobacco: Never Used  . Tobacco comment: over 20 years ago  Substance and Sexual Activity  . Alcohol use: Yes    Alcohol/week: 1.8 oz    Types: 3 Glasses of wine per week  . Drug use: No  . Sexual activity: Yes    Partners: Male  Other Topics Concern  . Not on file  Social History Narrative   Exercise--some bike riding   Caffeine Use: daily 1 cup of coffee        Objective:  Physical Exam  General: AAO x3, NAD  Dermatological: Slight erythema to the dorsal medial aspect of the left first MPJ on the dorsal bony prominence.  This is likely more from inflammation as opposed to infection.  There is no increase in warmth or fluctuation or crepitation.  No open lesions identified.  Vascular: Dorsalis Pedis artery and Posterior Tibial artery pedal pulses are  2/4 bilateral with immedate capillary fill time.  There is no pain with calf compression, swelling, warmth, erythema.   Neruologic: Grossly intact via light touch bilateral.  Protective threshold with Semmes Wienstein monofilament intact to all pedal sites bilateral.   Musculoskeletal: On the left foot there is decreased range of motion first MTPJ and there is pain with range of motion of the joint.  There is trace edema to the first MPJ.  There is no first ray hypomobility.  No other area of pinpoint tenderness.  Moderate bunion on the right side without any pain with MPJ range of motion or first ray hypomobility.  Pain is minimal today on the right side.  No other areas of tenderness.  Muscular strength 5/5 in all groups tested bilateral.  Gait: Unassisted, Nonantalgic.       Assessment:   Hallux limitus left  first MPJ; bunion right foot     Plan:  -Treatment options discussed including all alternatives, risks, and complications -Etiology of symptoms were discussed -X-rays were obtained and reviewed with the patient.  Significant arthritic changes are present in the first MPJ in the left side.  No evidence of acute fracture bilaterally.  Moderate HAV is present on the right foot. -In regards to her left foot we discussed both conservative as well as surgical treatment options.  After discussion she wishes to proceed with surgery.  I recommended the first MPJ arthrodesis however she does not want to do this that she wants to try to restore some motion of the toe.  She is interested in the first MPJ implant.  I discussed with the success rates for this as well as she may need to have another surgery as it may not work or last long-term.  She understands this and she wishes to proceed with the first MPJ implant. -The incision placement as well as the postoperative course was discussed with the patient. I discussed risks of the surgery which include, but not limited to, infection, bleeding, pain, swelling, need for further surgery, delayed or nonhealing, painful or ugly scar, numbness or sensation changes, over/under correction, recurrence, transfer lesions, further deformity, hardware failure, DVT/PE, loss of toe/foot. Patient understands these risks and wishes to proceed with surgery. The surgical consent was reviewed with the patient all 3 pages were signed. No promises or guarantees were given to the outcome of the procedure. All questions were answered to the best of my ability. Before the surgery the patient was encouraged to call the office if there is any further questions. [facility] basis.  Vivi BarrackMatthew R Jakelin Taussig DPM

## 2017-06-08 DIAGNOSIS — M21611 Bunion of right foot: Secondary | ICD-10-CM

## 2017-06-08 DIAGNOSIS — M205X2 Other deformities of toe(s) (acquired), left foot: Secondary | ICD-10-CM | POA: Insufficient documentation

## 2017-06-08 HISTORY — DX: Other deformities of toe(s) (acquired), left foot: M20.5X2

## 2017-06-08 HISTORY — DX: Bunion of right foot: M21.611

## 2017-06-09 MED FILL — LEVOTHYROXINE 112 MCG TAB: 112 | 30 days supply | Qty: 30 | Fill #1

## 2017-06-17 ENCOUNTER — Other Ambulatory Visit: Payer: Self-pay | Admitting: Family Medicine

## 2017-06-18 MED FILL — FLUoxetine HCL 10 MG TABS: 10 | 90 days supply | Qty: 90 | Fill #0

## 2017-06-26 ENCOUNTER — Telehealth: Payer: Self-pay | Admitting: *Deleted

## 2017-06-26 NOTE — Telephone Encounter (Signed)
"  I am scheduled for surgery on March 6.  I am having a bunion implant surgery.  How long will I be non-weight bearing and how long will I be out of work?  I am a RN"  You will be able to walk.  If you do a lot of standing and walking, you will need to be out of work for about six to eight weeks.  If you can sit, you can go back to work after a week, as long as you can sit and elevate.  "I do a lot of walking so I guess it will be six to eight weeks."

## 2017-07-10 MED FILL — LEVOTHYROXINE 112 MCG TAB: 112 | 30 days supply | Qty: 30 | Fill #2

## 2017-07-16 ENCOUNTER — Encounter: Payer: Self-pay | Admitting: Podiatry

## 2017-07-16 DIAGNOSIS — M25572 Pain in left ankle and joints of left foot: Secondary | ICD-10-CM | POA: Diagnosis not present

## 2017-07-16 DIAGNOSIS — K219 Gastro-esophageal reflux disease without esophagitis: Secondary | ICD-10-CM | POA: Diagnosis not present

## 2017-07-16 DIAGNOSIS — M2022 Hallux rigidus, left foot: Secondary | ICD-10-CM | POA: Diagnosis not present

## 2017-07-16 MED FILL — CEPHALEXIN 500 MG CAPSULE: 500 | 7 days supply | Qty: 21 | Fill #0

## 2017-07-16 MED FILL — OXYCOD/ACETAMINOPHEN 5-325M: 5-325 | 3 days supply | Qty: 25 | Fill #0

## 2017-07-16 MED FILL — PROMETHAZINE 25 MG TABLET: 25 | 7 days supply | Qty: 20 | Fill #0

## 2017-07-16 NOTE — Progress Notes (Signed)
Pre-operative Note  [facility] for surgical intervention of the LEFT foot for Michelle CoasterKeller bunionectomy with 1st MTPJ implant. The surgical consent was reviewed with the patient and we discussed the procedure as well as the postoperative course. She did not want an MPJ arthrodesis and discussed that the implant may fail or need further surgery among other risks. I again discussed all alternatives, risks, complications. I answered all of their questions to the best of my ability and they wish to proceed with surgery. No promises or guarantees were given as to the outcome of the surgery.   The surgical consent was signed.   Patient is NPO since midnight.  The patient does not have have a history of blood clots or bleeding disorders.   No further questions.   Ovid CurdMatthew Wagoner, DPM [facility]

## 2017-07-18 ENCOUNTER — Telehealth: Payer: Self-pay | Admitting: *Deleted

## 2017-07-18 NOTE — Telephone Encounter (Signed)
Called and spoke with the patient yesterday about the surgery on Wednesday with Dr Ardelle AntonWagoner and patient that she was doing good and had the boot on and did trip on the sock while trying to get the dog and the toes are pink and warm and patient did not have any fever, chills, nausea and there was some tingling on the back of the leg and I stated if that did not go away to call or go to the ER and to call the office if any concerns or questions. Misty StanleyLisa

## 2017-07-19 ENCOUNTER — Encounter: Payer: Self-pay | Admitting: Podiatry

## 2017-07-21 ENCOUNTER — Ambulatory Visit (INDEPENDENT_AMBULATORY_CARE_PROVIDER_SITE_OTHER): Payer: 59 | Admitting: Podiatry

## 2017-07-21 ENCOUNTER — Encounter: Payer: Self-pay | Admitting: Podiatry

## 2017-07-21 ENCOUNTER — Ambulatory Visit (INDEPENDENT_AMBULATORY_CARE_PROVIDER_SITE_OTHER): Payer: 59

## 2017-07-21 DIAGNOSIS — M205X2 Other deformities of toe(s) (acquired), left foot: Secondary | ICD-10-CM

## 2017-07-21 DIAGNOSIS — Z9889 Other specified postprocedural states: Secondary | ICD-10-CM

## 2017-07-22 NOTE — Progress Notes (Signed)
Subjective: Michelle Pratt is a 62 y.o. is seen today in office s/p left foot first MPJ implant preformed on 07/16/2017.  She states her pain is minimal she only took 1 pain medicine pill.  She is remained in the cam boot she uses one crutch for balance.  Overall she feels well and she denies any systemic complaints such as fevers, chills, nausea, vomiting. No calf pain, chest pain, shortness of breath.   Objective: General: No acute distress, AAOx3  DP/PT pulses palpable 2/4, CRT < 3 sec to all digits.  Protective sensation intact. Motor function intact.  LEFT foot: Incision is well coapted without any evidence of dehiscence and suture ends are intact. There is no surrounding erythema, ascending cellulitis, fluctuance, crepitus, malodor, drainage/purulence. There is mild edema around the surgical site. There is minimal pain along the surgical site.  Mild ecchymosis to the digits.  She appears to be doing well.  There does appear to be improved range of motion first MPJ and there is no pain with MPJ range of motion today. No other areas of tenderness to bilateral lower extremities.  No other open lesions or pre-ulcerative lesions.  No pain with calf compression, swelling, warmth, erythema.   Assessment and Plan:  Status post left first MTPJ implant, doing well with no complications   -Treatment options discussed including all alternatives, risks, and complications -X-rays were obtained and reviewed.  No evidence of acute fracture.  Status post first MPJ removal with implant arthroplasty -Antibiotic ointment was applied to the incision followed by dry sterile dressing.  Keep the dressing clean, dry, intact -Ice/elevation - -Pain medication as needed. -She stated today that she is going to [place] this weekend.  I want her to keep her foot elevated also start 81 mg aspirin to help prevent blood clots. -Monitor for any clinical signs or symptoms of infection and DVT/PE and directed to call the  office immediately should any occur or go to the ER. -Follow-up 10 days or sooner if any problems arise. In the meantime, encouraged to call the office with any questions, concerns, change in symptoms.   Ovid CurdMatthew Wagoner, DPM

## 2017-07-28 ENCOUNTER — Encounter: Payer: 59 | Admitting: Podiatry

## 2017-07-29 ENCOUNTER — Ambulatory Visit (INDEPENDENT_AMBULATORY_CARE_PROVIDER_SITE_OTHER): Payer: 59 | Admitting: Podiatry

## 2017-07-29 DIAGNOSIS — Z9889 Other specified postprocedural states: Secondary | ICD-10-CM

## 2017-07-29 DIAGNOSIS — M205X2 Other deformities of toe(s) (acquired), left foot: Secondary | ICD-10-CM

## 2017-07-30 NOTE — Progress Notes (Signed)
Subjective: Michelle Pratt is a 62 y.o. is seen today in office s/p left foot first MPJ implant preformed on 07/16/2017.  She states that she was doing well however since I last saw her she continued to take a 12-hour trip to [place] and she did do walking as she was going to a steak house.  She states that she has been having some increased pain today.  She denies any recent injury or trauma.  She did have to take 1 Percocet for the pain she is been taking anti-inflammatories otherwise.  Overall she feels well and she denies any systemic complaints such as fevers, chills, nausea, vomiting. No calf pain, chest pain, shortness of breath.   Objective: General: No acute distress, AAOx3  DP/PT pulses palpable 2/4, CRT < 3 sec to all digits.  Protective sensation intact. Motor function intact.  LEFT foot: Incision is well coapted without any evidence of dehiscence and suture ends are intact. There is no surrounding erythema, ascending cellulitis, fluctuance, crepitus, malodor, drainage/purulence. There is mild edema around the surgical site. There is mild pain along the surgical site.  There is pain with end range of motion there is some mild restriction with MPJ range of motion however this could be secondary to the mild swelling guarding due to pain.  Ecchymosis to the toes that she was having has improved.  Overall the foot appears to be healing well.  No other areas of tenderness to bilateral lower extremities.  No other open lesions or pre-ulcerative lesions.  No pain with calf compression, swelling, warmth, erythema.   Assessment and Plan:  Status post left first MTPJ implant, some increase in pain  -Treatment options discussed including right all alternatives, risks, and complications -She had a mild increase in pain but since I last saw her she has gone on 11-12-hour trip to [place] and she was doing walking around when she was there.  I think this is contributing to her pain.  There is no  signs of DVT and overall the foot appears to be healing well as far as the incision.  I want her to continue to ice and elevate and take it easy.  Anti-inflammatories as needed as well as pain medication as needed.  Remain in cam boot at all times. -Monitor for any clinical signs or symptoms of infection and directed to call the office immediately should any occur or go to the ER. -RTC 1 week or sooner if needed.  Vivi BarrackMatthew R Imonie Tuch DPM

## 2017-08-08 ENCOUNTER — Ambulatory Visit (INDEPENDENT_AMBULATORY_CARE_PROVIDER_SITE_OTHER): Payer: 59

## 2017-08-08 ENCOUNTER — Ambulatory Visit: Payer: 59

## 2017-08-08 ENCOUNTER — Ambulatory Visit (INDEPENDENT_AMBULATORY_CARE_PROVIDER_SITE_OTHER): Payer: 59 | Admitting: Podiatry

## 2017-08-08 DIAGNOSIS — M205X2 Other deformities of toe(s) (acquired), left foot: Secondary | ICD-10-CM | POA: Diagnosis not present

## 2017-08-08 DIAGNOSIS — Z9889 Other specified postprocedural states: Secondary | ICD-10-CM

## 2017-08-08 NOTE — Progress Notes (Signed)
Subjective: Michelle Pratt is a 62 y.o. is seen today in office s/p left foot first MPJ implant preformed on 07/16/2017. She states that the foot overall is feeling better.  She states that her skin feels tight but overall doing better.  She has been walking in the cam boot.  Overall she states that she feels much better.  She not take any pain medication.  She has been doing more walking in the boot.  No recent injury.  She has no other concerns today. She denies any systemic complaints such as fevers, chills, nausea, vomiting. No calf pain, chest pain, shortness of breath.   Objective: General: No acute distress, AAOx3  DP/PT pulses palpable 2/4, CRT < 3 sec to all digits.  Protective sensation intact. Motor function intact.  LEFT foot: Incision is well coapted without any evidence of dehiscence and suture ends are intact Incision appears to be healing well there is no evidence of dehiscence.  There is no surrounding erythema, ascending sialitis.  There is no fluctuation or crepitation.  There is no malodor.  No clinical signs of infection.  There is increased range of motion of the first MTPJ and there is no pain with MPJ range of motion today.  There is no other area tenderness.  Mild swelling to the foot. No other open lesions or pre-ulcerative lesions.  No pain with calf compression, swelling, warmth, erythema.   Assessment and Plan:  Status post left first MTPJ implant, doing well   -Treatment options discussed including right all alternatives, risks, and complications -X-rays were obtained and reviewed.  No evidence of acute fracture.  Implants needs to be a little bit but clinically there is good range of motion of the joints and there is no pain with MPJ range of motion. -As pulmonary continue with the cam boot as well as continue with range of motion exercises of the first MPJ.  I dispensed a surgical shoe that she can wear at home for short distances.  Continue to ice and elevate.  Pain  medication as needed but she has not been needing this. -Follow-up in 10-14 days or sooner if any issues are to arise.  Call any questions or concerns.  Vivi BarrackMatthew R Dossie Ocanas DPM

## 2017-08-11 MED FILL — LEVOTHYROXINE 112 MCG TAB: 112 | 30 days supply | Qty: 30 | Fill #3

## 2017-08-19 ENCOUNTER — Ambulatory Visit (INDEPENDENT_AMBULATORY_CARE_PROVIDER_SITE_OTHER): Payer: 59 | Admitting: Podiatry

## 2017-08-19 DIAGNOSIS — M205X2 Other deformities of toe(s) (acquired), left foot: Secondary | ICD-10-CM

## 2017-08-19 DIAGNOSIS — Z9889 Other specified postprocedural states: Secondary | ICD-10-CM

## 2017-08-19 NOTE — Progress Notes (Signed)
Subjective: Vonzella Nippleherese S Capozzoli is a 62 y.o. is seen today in office s/p left foot first MPJ implant preformed on 07/16/2017.  She states that overall she is doing better.  She is not taking any pain medication.  There were some suture and still intact and she removed them herself but some Steri-Strips over the incision of the incision is been doing well she denies any open wounds.  She denies any drainage or pus any surrounding redness.  She has been trying to move her toe.  She did go without the shoe for some time and she states that she was trying to up on her toes and did have some discomfort. She has no other concerns today. She denies any systemic complaints such as fevers, chills, nausea, vomiting. No calf pain, chest pain, shortness of breath.   Objective: General: No acute distress, AAOx3  DP/PT pulses palpable 2/4, CRT < 3 sec to all digits.  Protective sensation intact. Motor function intact.  LEFT foot: Incision is well coapted without any evidence of dehiscence and eschar is formed.  There is no drainage or pus there is no surrounding erythema, ascending cellulitis.  There is no fluctuation or crepitation.  There is no malodor.  Improved range of motion of the first MTPJ other still restriction of the joint.  Decreased edema to the area and there is no clinical signs of infection.  No other areas of tenderness. No other open lesions or pre-ulcerative lesions.  No pain with calf compression, swelling, warmth, erythema.   Assessment and Plan:  Status post left first MTPJ implant, doing well   -Treatment options discussed including right all alternatives, risks, and complications -This time the incision is doing well.  Continue antibiotic ointment and a dressing daily.  I want her to continue with range of motion exercises.  We will go ahead and start physical therapy and a prescription for [facility] was written today.She can wear a surgical shoe.  Continue ice and elevate. -RTC  2 weeks or sooner if needed  Vivi BarrackMatthew R Jakobe Blau DPM

## 2017-08-21 DIAGNOSIS — H524 Presbyopia: Secondary | ICD-10-CM | POA: Diagnosis not present

## 2017-08-22 DIAGNOSIS — M25572 Pain in left ankle and joints of left foot: Secondary | ICD-10-CM | POA: Diagnosis not present

## 2017-08-22 DIAGNOSIS — R269 Unspecified abnormalities of gait and mobility: Secondary | ICD-10-CM | POA: Diagnosis not present

## 2017-08-22 DIAGNOSIS — M25675 Stiffness of left foot, not elsewhere classified: Secondary | ICD-10-CM | POA: Diagnosis not present

## 2017-08-22 DIAGNOSIS — M25475 Effusion, left foot: Secondary | ICD-10-CM | POA: Diagnosis not present

## 2017-08-26 DIAGNOSIS — M25675 Stiffness of left foot, not elsewhere classified: Secondary | ICD-10-CM | POA: Diagnosis not present

## 2017-08-26 DIAGNOSIS — M25475 Effusion, left foot: Secondary | ICD-10-CM | POA: Diagnosis not present

## 2017-08-26 DIAGNOSIS — M25572 Pain in left ankle and joints of left foot: Secondary | ICD-10-CM | POA: Diagnosis not present

## 2017-08-26 DIAGNOSIS — R269 Unspecified abnormalities of gait and mobility: Secondary | ICD-10-CM | POA: Diagnosis not present

## 2017-08-28 DIAGNOSIS — M25475 Effusion, left foot: Secondary | ICD-10-CM | POA: Diagnosis not present

## 2017-08-28 DIAGNOSIS — M25572 Pain in left ankle and joints of left foot: Secondary | ICD-10-CM | POA: Diagnosis not present

## 2017-08-28 DIAGNOSIS — M25675 Stiffness of left foot, not elsewhere classified: Secondary | ICD-10-CM | POA: Diagnosis not present

## 2017-08-28 DIAGNOSIS — R269 Unspecified abnormalities of gait and mobility: Secondary | ICD-10-CM | POA: Diagnosis not present

## 2017-09-02 DIAGNOSIS — M25475 Effusion, left foot: Secondary | ICD-10-CM | POA: Diagnosis not present

## 2017-09-02 DIAGNOSIS — M25572 Pain in left ankle and joints of left foot: Secondary | ICD-10-CM | POA: Diagnosis not present

## 2017-09-02 DIAGNOSIS — R269 Unspecified abnormalities of gait and mobility: Secondary | ICD-10-CM | POA: Diagnosis not present

## 2017-09-02 DIAGNOSIS — M25675 Stiffness of left foot, not elsewhere classified: Secondary | ICD-10-CM | POA: Diagnosis not present

## 2017-09-04 DIAGNOSIS — M25572 Pain in left ankle and joints of left foot: Secondary | ICD-10-CM | POA: Diagnosis not present

## 2017-09-04 DIAGNOSIS — M25675 Stiffness of left foot, not elsewhere classified: Secondary | ICD-10-CM | POA: Diagnosis not present

## 2017-09-04 DIAGNOSIS — M25475 Effusion, left foot: Secondary | ICD-10-CM | POA: Diagnosis not present

## 2017-09-04 DIAGNOSIS — R269 Unspecified abnormalities of gait and mobility: Secondary | ICD-10-CM | POA: Diagnosis not present

## 2017-09-08 ENCOUNTER — Encounter: Payer: Self-pay | Admitting: Podiatry

## 2017-09-08 ENCOUNTER — Ambulatory Visit (INDEPENDENT_AMBULATORY_CARE_PROVIDER_SITE_OTHER): Payer: 59 | Admitting: Podiatry

## 2017-09-08 DIAGNOSIS — M205X2 Other deformities of toe(s) (acquired), left foot: Secondary | ICD-10-CM

## 2017-09-08 DIAGNOSIS — Z9889 Other specified postprocedural states: Secondary | ICD-10-CM

## 2017-09-08 NOTE — Progress Notes (Signed)
Subjective: Michelle Pratt is a 62 y.o. is seen today in office s/p left foot first MPJ implant preformed on 07/16/2017.  She states that she is doing well the incision is healing well.  She is been wearing a surgical shoe and she is continue physical therapy.  She has gone for short distances going barefoot she states that she has no pain when doing this and overall her pain is getting much better.  She also feels that the range of motion has improved.  No recent injury or falls.  She still gets an occasional swelling.  She has no other concerns. She denies any systemic complaints such as fevers, chills, nausea, vomiting. No calf pain, chest pain, shortness of breath.   Objective: General: No acute distress, AAOx3  DP/PT pulses palpable 2/4, CRT < 3 sec to all digits.  Protective sensation intact. Motor function intact.  LEFT foot: Incision is well coapted without any evidence of dehiscence and a scar is formed.  There is improved range of motion first MTPJ and there is no pain with MPJ range of motion.  Minimal swelling to the area there is no erythema or increase in warmth and there is no clinical signs of infection.  No other area tenderness identified on exam today. No pain with calf compression, swelling, warmth, erythema.   Assessment and Plan:  Status post left first MTPJ implant, doing well   -Treatment options discussed including right all alternatives, risks, and complications -She is scheduled to go back to work in a couple of weeks.  To continue physical therapy including increased range of motion activities.  She can slowly transition back to regular shoe as tolerable. Continue with compression and ice to the area daily.  Even she is doing well we may have her continue with wearing a surgical shoe when she first returns to work. -Follow-up in 3 to 4 weeks or sooner if needed.  Encouraged to call any questions or concerns or any change in symptoms.  Vivi Barrack DPM

## 2017-09-12 MED FILL — LEVOTHYROXINE 112 MCG TAB: 112 | 30 days supply | Qty: 30 | Fill #4

## 2017-09-15 DIAGNOSIS — M25572 Pain in left ankle and joints of left foot: Secondary | ICD-10-CM | POA: Diagnosis not present

## 2017-09-15 DIAGNOSIS — M25475 Effusion, left foot: Secondary | ICD-10-CM | POA: Diagnosis not present

## 2017-09-15 DIAGNOSIS — M25675 Stiffness of left foot, not elsewhere classified: Secondary | ICD-10-CM | POA: Diagnosis not present

## 2017-09-15 DIAGNOSIS — R269 Unspecified abnormalities of gait and mobility: Secondary | ICD-10-CM | POA: Diagnosis not present

## 2017-09-17 DIAGNOSIS — M25475 Effusion, left foot: Secondary | ICD-10-CM | POA: Diagnosis not present

## 2017-09-17 DIAGNOSIS — R269 Unspecified abnormalities of gait and mobility: Secondary | ICD-10-CM | POA: Diagnosis not present

## 2017-09-17 DIAGNOSIS — M25572 Pain in left ankle and joints of left foot: Secondary | ICD-10-CM | POA: Diagnosis not present

## 2017-09-17 DIAGNOSIS — M25675 Stiffness of left foot, not elsewhere classified: Secondary | ICD-10-CM | POA: Diagnosis not present

## 2017-09-19 MED FILL — FLUoxetine HCL 10 MG TABS: 10 | 90 days supply | Qty: 90 | Fill #1

## 2017-09-23 DIAGNOSIS — R269 Unspecified abnormalities of gait and mobility: Secondary | ICD-10-CM | POA: Diagnosis not present

## 2017-09-23 DIAGNOSIS — M25475 Effusion, left foot: Secondary | ICD-10-CM | POA: Diagnosis not present

## 2017-09-23 DIAGNOSIS — M25675 Stiffness of left foot, not elsewhere classified: Secondary | ICD-10-CM | POA: Diagnosis not present

## 2017-09-23 DIAGNOSIS — M25572 Pain in left ankle and joints of left foot: Secondary | ICD-10-CM | POA: Diagnosis not present

## 2017-09-24 DIAGNOSIS — M25572 Pain in left ankle and joints of left foot: Secondary | ICD-10-CM | POA: Diagnosis not present

## 2017-09-24 DIAGNOSIS — M25675 Stiffness of left foot, not elsewhere classified: Secondary | ICD-10-CM | POA: Diagnosis not present

## 2017-09-24 DIAGNOSIS — R269 Unspecified abnormalities of gait and mobility: Secondary | ICD-10-CM | POA: Diagnosis not present

## 2017-09-24 DIAGNOSIS — M25475 Effusion, left foot: Secondary | ICD-10-CM | POA: Diagnosis not present

## 2017-10-01 DIAGNOSIS — M25475 Effusion, left foot: Secondary | ICD-10-CM | POA: Diagnosis not present

## 2017-10-01 DIAGNOSIS — M25572 Pain in left ankle and joints of left foot: Secondary | ICD-10-CM | POA: Diagnosis not present

## 2017-10-01 DIAGNOSIS — M25675 Stiffness of left foot, not elsewhere classified: Secondary | ICD-10-CM | POA: Diagnosis not present

## 2017-10-01 DIAGNOSIS — R269 Unspecified abnormalities of gait and mobility: Secondary | ICD-10-CM | POA: Diagnosis not present

## 2017-10-02 ENCOUNTER — Ambulatory Visit (INDEPENDENT_AMBULATORY_CARE_PROVIDER_SITE_OTHER): Payer: 59 | Admitting: Podiatry

## 2017-10-02 ENCOUNTER — Ambulatory Visit (INDEPENDENT_AMBULATORY_CARE_PROVIDER_SITE_OTHER): Payer: 59

## 2017-10-02 DIAGNOSIS — M205X2 Other deformities of toe(s) (acquired), left foot: Secondary | ICD-10-CM

## 2017-10-02 DIAGNOSIS — Z9889 Other specified postprocedural states: Secondary | ICD-10-CM

## 2017-10-07 NOTE — Progress Notes (Signed)
Subjective: Michelle Pratt is a 62 y.o. is seen today in office s/p left foot first MPJ implant preformed on 07/16/2017.  Overall she states that she is improving.  She points to submetatarsal 2 where she gets the majority of symptoms.  She states that she is been back into regular shoe for max of 3 hours and due to discomfort she has cut back in the surgical shoe.  She has been working and she is also been traveling and active.  No recent injury or falls.  She has no other concerns. She denies any systemic complaints such as fevers, chills, nausea, vomiting. No calf pain, chest pain, shortness of breath.   Objective: General: No acute distress, AAOx3  DP/PT pulses palpable 2/4, CRT < 3 sec to all digits.  Protective sensation intact. Motor function intact.  LEFT foot: Incision is well coapted without any evidence of dehiscence and a scar is formed.  There is improved range of motion of the first MPJ there is no pain with MPJ range of motion.  There is no tenderness on the surgical site itself.  Majority of tenderness appears to be on the second third MTPJ's but there is no significant edema, erythema, increase in warmth identified to this area.  There is no area pinpoint bony tenderness or pain to vibratory sensation.  No other area tenderness identified on exam today. No pain with calf compression, swelling, warmth, erythema.   Assessment and Plan:  Status post left first MTPJ implant  -Treatment options discussed including right all alternatives, risks, and complications -X-rays were obtained reviewed.  Implant appears to be intact but there is no evidence of acute fracture or stress fracture identified today. -Continue with therapy.  Continue home range of motion exercises.  We can start to transition to regular shoe full-time as she is able to.  Dispensed metatarsal pads to see if this will help take pressure off the MPJs.  Also dispensed a graphite insert though she can return of her tennis  shoes so hopefully you can try to get back into she like that as opposed to the surgical shoe.  We can see what works best for her.  Continue to ice the area as well and gradually increase activity level. -Follow-up in 3 weeks or sooner if needed.  She has no further questions or concerns today.  Vivi Barrack DPM

## 2017-10-08 ENCOUNTER — Telehealth: Payer: Self-pay | Admitting: *Deleted

## 2017-10-08 DIAGNOSIS — M25475 Effusion, left foot: Secondary | ICD-10-CM | POA: Diagnosis not present

## 2017-10-08 DIAGNOSIS — R269 Unspecified abnormalities of gait and mobility: Secondary | ICD-10-CM | POA: Diagnosis not present

## 2017-10-08 DIAGNOSIS — M25675 Stiffness of left foot, not elsewhere classified: Secondary | ICD-10-CM | POA: Diagnosis not present

## 2017-10-08 DIAGNOSIS — M25572 Pain in left ankle and joints of left foot: Secondary | ICD-10-CM | POA: Diagnosis not present

## 2017-10-08 NOTE — Telephone Encounter (Signed)
Called and left a message for the patient to call me back about the carbon plate that I ordered for the womens 10 and I stated in the voice mail that I did have a mens 8 if the patient wanted to come by and try and see if it fits and to call the [facility] office at [phone]. Misty Stanley

## 2017-10-14 MED FILL — LEVOTHYROXINE 112 MCG TAB: 112 | 30 days supply | Qty: 30 | Fill #5

## 2017-10-21 DIAGNOSIS — J208 Acute bronchitis due to other specified organisms: Secondary | ICD-10-CM | POA: Diagnosis not present

## 2017-10-21 DIAGNOSIS — B9689 Other specified bacterial agents as the cause of diseases classified elsewhere: Secondary | ICD-10-CM | POA: Diagnosis not present

## 2017-10-21 MED FILL — AZITHROMYCIN 250 MG TABS: 250 | 5 days supply | Qty: 6 | Fill #0

## 2017-10-21 MED FILL — PROMETHAZINE-DM SYRUP: 6.25-15 | 7 days supply | Qty: 180 | Fill #0

## 2017-10-21 MED FILL — predniSONE 10 MG (48) TBPK: 10 | 12 days supply | Qty: 48 | Fill #0

## 2017-10-21 MED FILL — BENZONATATE 200 MG CAPS: 200 | 10 days supply | Qty: 30 | Fill #0

## 2017-10-23 ENCOUNTER — Encounter: Payer: 59 | Admitting: Podiatry

## 2017-10-27 ENCOUNTER — Ambulatory Visit (INDEPENDENT_AMBULATORY_CARE_PROVIDER_SITE_OTHER): Payer: Self-pay | Admitting: Family Medicine

## 2017-10-27 ENCOUNTER — Encounter: Payer: Self-pay | Admitting: Family Medicine

## 2017-10-27 VITALS — BP 120/72 | HR 72 | Temp 98.4°F | Wt 210.2 lb

## 2017-10-27 DIAGNOSIS — J329 Chronic sinusitis, unspecified: Secondary | ICD-10-CM

## 2017-10-27 DIAGNOSIS — R0981 Nasal congestion: Secondary | ICD-10-CM

## 2017-10-27 DIAGNOSIS — R05 Cough: Secondary | ICD-10-CM

## 2017-10-27 DIAGNOSIS — R059 Cough, unspecified: Secondary | ICD-10-CM

## 2017-10-27 MED ORDER — AMOXICILLIN-POT CLAVULANATE 875-125 MG PO TABS: 1.0000 | ORAL_TABLET | Freq: Two times a day (BID) | ORAL | 0 refills | Status: DC

## 2017-10-27 MED ORDER — MONTELUKAST SODIUM 10 MG PO TABS: 10.0000 mg | ORAL_TABLET | Freq: Every day | ORAL | 0 refills | Status: DC

## 2017-10-27 MED ORDER — IPRATROPIUM BROMIDE 0.06 % NA SOLN: 2.0000 | Freq: Four times a day (QID) | NASAL | 12 refills | Status: DC

## 2017-10-27 MED FILL — MONTELUKAST SOD 10 MG TAB: 10 | 15 days supply | Qty: 15 | Fill #0

## 2017-10-27 MED FILL — IPRATROPIUM 0.06% SPRAY: 0.06 | 21 days supply | Qty: 15 | Fill #0

## 2017-10-27 MED FILL — AMOX TR-K CLV 875-125 MG TA: 875-125 | 10 days supply | Qty: 20 | Fill #0

## 2017-10-27 NOTE — Progress Notes (Signed)
  Michelle Pratt is a 62 y.o. female who presents today with concerns of persistent cough and cold symptoms for the last 20 days. She was previously treated for bronchitis with azithromycin without any improvement  Review of Systems  Constitutional: Positive for malaise/fatigue. Negative for chills and fever.  HENT: Positive for congestion, sinus pain and sore throat. Negative for ear discharge and ear pain.   Eyes: Negative.   Respiratory: Positive for cough. Negative for sputum production and shortness of breath.   Cardiovascular: Negative.  Negative for chest pain.  Gastrointestinal: Negative for abdominal pain, diarrhea, nausea and vomiting.  Genitourinary: Negative for dysuria, frequency, hematuria and urgency.  Musculoskeletal: Negative for myalgias.  Skin: Negative.   Neurological: Negative for headaches.  Endo/Heme/Allergies: Negative.   Psychiatric/Behavioral: Negative.    O: Vitals:   10/27/17 1226  BP: 120/72  Pulse: 72  Temp: 98.4 F (36.9 C)  SpO2: 96%    Physical Exam  Constitutional: She is oriented to person, place, and time. Vital signs are normal. She appears well-developed and well-nourished. She is active.  Non-toxic appearance. She does not have a sickly appearance.  HENT:  Head: Normocephalic.  Right Ear: Hearing, external ear and ear canal normal. Tympanic membrane is injected and erythematous.  Left Ear: Hearing, tympanic membrane, external ear and ear canal normal.  Nose: Rhinorrhea present. Right sinus exhibits maxillary sinus tenderness.  Mouth/Throat: Uvula is midline and oropharynx is clear and moist.  Neck: Normal range of motion. Neck supple.  Cardiovascular: Normal rate, regular rhythm, normal heart sounds and normal pulses.  Pulmonary/Chest: Effort normal. She has wheezes in the right upper field. She has rhonchi in the right lower field and the left lower field.  Abdominal: Soft. Bowel sounds are normal.  Musculoskeletal: Normal range of  motion.  Lymphadenopathy:       Head (right side): No submental and no submandibular adenopathy present.       Head (left side): No submental and no submandibular adenopathy present.    She has no cervical adenopathy.  Neurological: She is alert and oriented to person, place, and time.  Psychiatric: She has a normal mood and affect.  Vitals reviewed.  A: 1. Sinusitis, unspecified chronicity, unspecified location   2. Cough   3. Nasal congestion    P: Exam findings, diagnosis etiology and medication use and indications reviewed with patient. Follow- Up and discharge instructions provided. No emergent/urgent issues found on exam.  Patient verbalized understanding of information provided and agrees with plan of care (POC), all questions answered.  1. Sinusitis, unspecified chronicity, unspecified location - amoxicillin-clavulanate (AUGMENTIN) 875-125 MG tablet; Take 1 tablet by mouth 2 (two) times daily.  2. Cough - montelukast (SINGULAIR) 10 MG tablet; Take 1 tablet (10 mg total) by mouth at bedtime.  3. Nasal congestion - ipratropium (ATROVENT) 0.06 % nasal spray; Place 2 sprays into both nostrils 4 (four) times daily.

## 2017-10-27 NOTE — Patient Instructions (Addendum)
Food Choices to Help Relieve Diarrhea, Adult When you have diarrhea, the foods you eat and your eating habits are very important. Choosing the right foods and drinks can help:  Relieve diarrhea.  Replace lost fluids and nutrients.  Prevent dehydration.  What general guidelines should I follow? Relieving diarrhea  Choose foods with less than 2 g or .07 oz. of fiber per serving.  Limit fats to less than 8 tsp (38 g or 1.34 oz.) a day.  Avoid the following: ? Foods and beverages sweetened with high-fructose corn syrup, honey, or sugar alcohols such as xylitol, sorbitol, and mannitol. ? Foods that contain a lot of fat or sugar. ? Fried, greasy, or spicy foods. ? High-fiber grains, breads, and cereals. ? Raw fruits and vegetables.  Eat foods that are rich in probiotics. These foods include dairy products such as yogurt and fermented milk products. They help increase healthy bacteria in the stomach and intestines (gastrointestinal tract, or GI tract).  If you have lactose intolerance, avoid dairy products. These may make your diarrhea worse.  Take medicine to help stop diarrhea (antidiarrheal medicine) only as told by your health care provider. Replacing nutrients  Eat small meals or snacks every 3-4 hours.  Eat bland foods, such as white rice, toast, or baked potato, until your diarrhea starts to get better. Gradually reintroduce nutrient-rich foods as tolerated or as told by your health care provider. This includes: ? Well-cooked protein foods. ? Peeled, seeded, and soft-cooked fruits and vegetables. ? Low-fat dairy products.  Take vitamin and mineral supplements as told by your health care provider. Preventing dehydration   Start by sipping water or a special solution to prevent dehydration (oral rehydration solution, ORS). Urine that is clear or pale yellow means that you are getting enough fluid.  Try to drink at least 8-10 cups of fluid each day to help replace lost  fluids.  You may add other liquids in addition to water, such as clear juice or decaffeinated sports drinks, as tolerated or as told by your health care provider.  Avoid drinks with caffeine, such as coffee, tea, or soft drinks.  Avoid alcohol. What foods are recommended? The items listed may not be a complete list. Talk with your health care provider about what dietary choices are best for you. Grains White rice. White, [place], or pita breads (fresh or toasted), including plain rolls, buns, or bagels. White pasta. Saltine, soda, or [place] crackers. Pretzels. Low-fiber cereal. Cooked cereals made with water (such as cornmeal, farina, or cream cereals). Plain muffins. Matzo. Melba toast. Zwieback. Vegetables Potatoes (without the skin). Most well-cooked and canned vegetables without skins or seeds. Tender lettuce. Fruits Apple sauce. Fruits canned in juice. Cooked apricots, cherries, grapefruit, peaches, pears, or plums. Fresh bananas and cantaloupe. Meats and other protein foods Baked or boiled chicken. Eggs. Tofu. Fish. Seafood. Smooth nut butters. Ground or well-cooked tender beef, ham, veal, lamb, pork, or poultry. Dairy Plain yogurt, kefir, and unsweetened liquid yogurt. Lactose-free milk, buttermilk, skim milk, or soy milk. Low-fat or nonfat hard cheese. Beverages Water. Low-calorie sports drinks. Fruit juices without pulp. Strained tomato and vegetable juices. Decaffeinated teas. Sugar-free beverages not sweetened with sugar alcohols. Oral rehydration solutions, if approved by your health care provider. Seasoning and other foods Bouillon, broth, or soups made from recommended foods. What foods are not recommended? The items listed may not be a complete list. Talk with your health care provider about what dietary choices are best for you. Grains Whole grain, whole  wheat, bran, or rye breads, rolls, pastas, and crackers. Wild or brown rice. Whole grain or bran cereals. Barley. Oats and  oatmeal. Corn tortillas or taco shells. Granola. Popcorn. Vegetables Raw vegetables. Fried vegetables. Cabbage, broccoli, Brussels sprouts, artichokes, baked beans, beet greens, corn, kale, legumes, peas, sweet potatoes, and yams. Potato skins. Cooked spinach and cabbage. Fruits Dried fruit, including raisins and dates. Raw fruits. Stewed or dried prunes. Canned fruits with syrup. Meat and other protein foods Fried or fatty meats. Deli meats. Chunky nut butters. Nuts and seeds. Beans and lentils. Tomasa Blase. Hot dogs. Sausage. Dairy High-fat cheeses. Whole milk, chocolate milk, and beverages made with milk, such as milk shakes. Half-and-half. Cream. sour cream. Ice cream. Beverages Caffeinated beverages (such as coffee, tea, soda, or energy drinks). Alcoholic beverages. Fruit juices with pulp. Prune juice. Soft drinks sweetened with high-fructose corn syrup or sugar alcohols. High-calorie sports drinks. Fats and oils Butter. Cream sauces. Margarine. Salad oils. Plain salad dressings. Olives. Avocados. Mayonnaise. Sweets and desserts Sweet rolls, doughnuts, and sweet breads. Sugar-free desserts sweetened with sugar alcohols such as xylitol and sorbitol. Seasoning and other foods Honey. Hot sauce. Chili powder. Gravy. Cream-based or milk-based soups. Pancakes and waffles. Summary  When you have diarrhea, the foods you eat and your eating habits are very important.  Make sure you get at least 8-10 cups of fluid each day, or enough to keep your urine clear or pale yellow.  Eat bland foods and gradually reintroduce healthy, nutrient-rich foods as tolerated, or as told by your health care provider.  Avoid high-fiber, fried, greasy, or spicy foods. This information is not intended to replace advice given to you by your health care provider. Make sure you discuss any questions you have with your health care provider. Document Released: 07/20/2003 Document Revised: 04/26/2016 Document Reviewed:  04/26/2016 Elsevier Interactive Patient Education  2018 [employer]. Sinusitis, Adult Sinusitis is soreness and inflammation of your sinuses. Sinuses are hollow spaces in the bones around your face. They are located:  Around your eyes.  In the middle of your forehead.  Behind your nose.  In your cheekbones.  Your sinuses and nasal passages are lined with a stringy fluid (mucus). Mucus normally drains out of your sinuses. When your nasal tissues get inflamed or swollen, the mucus can get trapped or blocked so air cannot flow through your sinuses. This lets bacteria, viruses, and funguses grow, and that leads to infection. Follow these instructions at home: Medicines  Take, use, or apply over-the-counter and prescription medicines only as told by your doctor. These may include nasal sprays.  If you were prescribed an antibiotic medicine, take it as told by your doctor. Do not stop taking the antibiotic even if you start to feel better. Hydrate and Humidify  Drink enough water to keep your pee (urine) clear or pale yellow.  Use a cool mist humidifier to keep the humidity level in your home above 50%.  Breathe in steam for 10-15 minutes, 3-4 times a day or as told by your doctor. You can do this in the bathroom while a hot shower is running.  Try not to spend time in cool or dry air. Rest  Rest as much as possible.  Sleep with your head raised (elevated).  Make sure to get enough sleep each night. General instructions  Put a warm, moist washcloth on your face 3-4 times a day or as told by your doctor. This will help with discomfort.  Wash your hands often with soap and  water. If there is no soap and water, use hand sanitizer.  Do not smoke. Avoid being around people who are smoking (secondhand smoke).  Keep all follow-up visits as told by your doctor. This is important. Contact a doctor if:  You have a fever.  Your symptoms get worse.  Your symptoms do not get better  within 10 days. Get help right away if:  You have a very bad headache.  You cannot stop throwing up (vomiting).  You have pain or swelling around your face or eyes.  You have trouble seeing.  You feel confused.  Your neck is stiff.  You have trouble breathing. This information is not intended to replace advice given to you by your health care provider. Make sure you discuss any questions you have with your health care provider. Document Released: 10/16/2007 Document Revised: 12/24/2015 Document Reviewed: 02/22/2015 Elsevier Interactive Patient Education  [employer]2018 Else[employer] Inc[employer]

## 2017-10-28 ENCOUNTER — Encounter: Payer: Self-pay | Admitting: Podiatry

## 2017-10-28 ENCOUNTER — Ambulatory Visit (INDEPENDENT_AMBULATORY_CARE_PROVIDER_SITE_OTHER): Payer: 59 | Admitting: Podiatry

## 2017-10-28 DIAGNOSIS — Z9889 Other specified postprocedural states: Secondary | ICD-10-CM

## 2017-10-28 DIAGNOSIS — M205X2 Other deformities of toe(s) (acquired), left foot: Secondary | ICD-10-CM

## 2017-10-28 DIAGNOSIS — M779 Enthesopathy, unspecified: Secondary | ICD-10-CM

## 2017-10-29 NOTE — Progress Notes (Signed)
Subjective: Michelle Pratt is a 62 y.o. is seen today in office s/p left foot first MPJ implant preformed on 07/16/2017.  She states overall she is doing much better.  I talked to her while she was working couple weeks ago and she is unable to wear regular shoe would not have to day but since I last spoke to her she did purchase a new pair of tennis shoes and that has made her foot feel much better.  She is able to wear shoe all day at work.  She states that overall she is very happy she did the surgery.  Before the surgery she was limping when she left work however she is no longer doing this.  She did not use the carbon insert inside of her shoes and she actually does pick that up today.  She has no other concerns today. She denies any systemic complaints such as fevers, chills, nausea, vomiting. No calf pain, chest pain, shortness of breath.   Objective: General: No acute distress, AAOx3  DP/PT pulses palpable 2/4, CRT < 3 sec to all digits.  Protective sensation intact. Motor function intact.  LEFT foot: Incision is well coapted without any evidence of dehiscence and a scar is formed.  There is improved range of motion of the MPJ there is no pain with MPJ range of motion on the surgical site.  There is no significant edema, erythema, increase in warmth and there is no clinical signs of infection present today.  Mild discomfort second MPJ.  She states that this is the area started before the surgery when she stubbed her toe.  That area is getting better as well.  There is no significant bruising. No other area tenderness identified on exam today. No pain with calf compression, swelling, warmth, erythema.   Assessment and Plan:  Status post left first MTPJ implant  -Treatment options discussed including right all alternatives, risks, and complications -I reviewed the old x-rays with her. -She is wearing regular shoe full-time and her symptoms are much improved compared to what they were prior to  surgery.  I want her to gradually increase her activity level.  Continue wearing supportive shoes.  At this point will discharge her from the postoperative course that she is doing well however I encouraged her to call me with any questions or concerns or any change in symptoms and she agreed with this.  Vivi BarrackMatthew R Wagoner DPM

## 2017-11-07 ENCOUNTER — Encounter: Payer: Self-pay | Admitting: Family Medicine

## 2017-11-07 ENCOUNTER — Encounter: Payer: 59 | Admitting: Family Medicine

## 2017-11-07 ENCOUNTER — Other Ambulatory Visit: Payer: Self-pay | Admitting: Family Medicine

## 2017-11-07 ENCOUNTER — Ambulatory Visit (INDEPENDENT_AMBULATORY_CARE_PROVIDER_SITE_OTHER): Payer: 59 | Admitting: Family Medicine

## 2017-11-07 VITALS — BP 126/76 | HR 80 | Temp 98.4°F | Resp 16 | Ht 69.4 in | Wt 207.0 lb

## 2017-11-07 DIAGNOSIS — Z0000 Encounter for general adult medical examination without abnormal findings: Secondary | ICD-10-CM

## 2017-11-07 DIAGNOSIS — Z1239 Encounter for other screening for malignant neoplasm of breast: Secondary | ICD-10-CM

## 2017-11-07 NOTE — Progress Notes (Signed)
Subjective:     Michelle Pratt is a 62 y.o. female and is here for a comprehensive physical exam. The patient reports difficulty losing weight .  Social History   Socioeconomic History  . Marital status: Married    Spouse name: Not on file  . Number of children: Not on file  . Years of education: Not on file  . Highest education level: Not on file  Occupational History  . Occupation: OR    Employer: [employer]  Social Needs  . Financial resource strain: Not on file  . Food insecurity:    Worry: Not on file    Inability: Not on file  . Transportation needs:    Medical: Not on file    Non-medical: Not on file  Tobacco Use  . Smoking status: Former Games developer  . Smokeless tobacco: Never Used  . Tobacco comment: over 20 years ago  Substance and Sexual Activity  . Alcohol use: Yes    Alcohol/week: 1.8 oz    Types: 3 Glasses of wine per week  . Drug use: No  . Sexual activity: Yes    Partners: Male  Lifestyle  . Physical activity:    Days per week: Not on file    Minutes per session: Not on file  . Stress: Not on file  Relationships  . Social connections:    Talks on phone: Not on file    Gets together: Not on file    Attends religious service: Not on file    Active member of club or organization: Not on file    Attends meetings of clubs or organizations: Not on file    Relationship status: Not on file  . Intimate partner violence:    Fear of current or ex partner: Not on file    Emotionally abused: Not on file    Physically abused: Not on file    Forced sexual activity: Not on file  Other Topics Concern  . Not on file  Social History Narrative   Exercise--some bike riding   Caffeine Use: daily 1 cup of coffee   Health Maintenance  Topic Date Due  . MAMMOGRAM  11/08/2017  . INFLUENZA VACCINE  12/11/2017  . PAP SMEAR  09/26/2018  . COLONOSCOPY  11/04/2020  . TETANUS/TDAP  09/04/2024  . Hepatitis C Screening  Completed  . HIV Screening  Completed     The following portions of the patient's history were reviewed and updated as appropriate:  She  has a past medical history of Menopause and Thyroid disease. She does not have any pertinent problems on file. She  has a past surgical history that includes Tonsillectomy; Tubal ligation; oraf; ORIF elbow fracture; and Toe Surgery (Right). Her family history includes Alcohol abuse in her mother; Cancer (age of onset: 29) in her father; Hypertension in her father; Parkinsonism in her mother; Stroke in her brother. She  reports that she has quit smoking. She has never used smokeless tobacco. She reports that she drinks about 1.8 oz of alcohol per week. She reports that she does not use drugs. She has a current medication list which includes the following prescription(s): epinephrine, fluoxetine, levothyroxine, and naproxen sodium. Current Outpatient Medications on File Prior to Visit  Medication Sig Dispense Refill  . EPINEPHrine (EPIPEN) 0.3 mg/0.3 mL IJ SOAJ injection Inject 0.3 mLs (0.3 mg total) into the muscle once as needed (allergic reaction). 1 Device 0  . FLUoxetine (PROZAC) 10 MG tablet TAKE 1 TABLET BY MOUTH  DAILY. 90 tablet 1  . levothyroxine (SYNTHROID, LEVOTHROID) 112 MCG tablet TAKE 1 TABLET BY MOUTH ONCE DAILY 30 tablet 5  . naproxen sodium (ANAPROX) 220 MG tablet Take 220 mg by mouth as needed.     No current facility-administered medications on file prior to visit.    She is allergic to bee venom and morphine..  Review of Systems Review of Systems  Constitutional: Negative for activity change, appetite change and fatigue.  HENT: Negative for hearing loss, congestion, tinnitus and ear discharge.  dentist [id number] Eyes: Negative for visual disturbance (see optho q1y -- vision corrected to 20/20 with glasses).  Respiratory: Negative for cough, chest tightness and shortness of breath.   Cardiovascular: Negative for chest pain, palpitations and leg swelling.  Gastrointestinal:  Negative for abdominal pain, diarrhea, constipation and abdominal distention.  Genitourinary: Negative for urgency, frequency, decreased urine volume and difficulty urinating.  Musculoskeletal: Negative for back pain, arthralgias and gait problem.  Skin: Negative for color change, pallor and rash.  Neurological: Negative for dizziness, light-headedness, numbness and headaches.  Hematological: Negative for adenopathy. Does not bruise/bleed easily.  Psychiatric/Behavioral: Negative for suicidal ideas, confusion, sleep disturbance, self-injury, dysphoric mood, decreased concentration and agitation.      Objective:    BP 126/76 (BP Location: Left Arm, Cuff Size: Large)   Pulse 80   Temp 98.4 F (36.9 C) (Oral)   Resp 16   Ht 5' 9.4" (1.763 m)   Wt 207 lb (93.9 kg)   SpO2 97%   BMI 30.22 kg/m  General appearance: alert, cooperative, appears stated age and no distress Head: Normocephalic, without obvious abnormality, atraumatic Eyes: conjunctivae/corneas clear. PERRL, EOM's intact. Fundi benign. Ears: normal TM's and external ear canals both ears Nose: Nares normal. Septum midline. Mucosa normal. No drainage or sinus tenderness. Throat: lips, mucosa, and tongue normal; teeth and gums normal Neck: no adenopathy, no carotid bruit, no JVD, supple, symmetrical, trachea midline and thyroid not enlarged, symmetric, no tenderness/mass/nodules Back: symmetric, no curvature. ROM normal. No CVA tenderness. Lungs: clear to auscultation bilaterally Breasts: normal appearance, no masses or tenderness Heart: regular rate and rhythm, S1, S2 normal, no murmur, click, rub or gallop Abdomen: soft, non-tender; bowel sounds normal; no masses,  no organomegaly Pelvic: deferred Extremities: extremities normal, atraumatic, no cyanosis or edema Pulses: 2+ and symmetric Skin: Skin color, texture, turgor normal. No rashes or lesions Lymph nodes: Cervical, supraclavicular, and axillary nodes  normal. Neurologic: Alert and oriented X 3, normal strength and tone. Normal symmetric reflexes. Normal coordination and gait Mental status: Alert, oriented, thought content appropriate    Assessment:    Healthy female exam.      Plan:    ghm utd Check labs See After Visit Summary for Counseling Recommendations     1. Preventative health care See above  - CBC with Differential/Platelet; Future - Comprehensive metabolic panel; Future - Lipid panel; Future - TSH; Future  2. Morbid obesity (HCC)  - Amb Ref to Medical Weight Management

## 2017-11-07 NOTE — Patient Instructions (Signed)
Preventive Care 40-64 Years, Female Preventive care refers to lifestyle choices and visits with your health care provider that can promote health and wellness. What does preventive care include?  A yearly physical exam. This is also called an annual well check.  Dental exams once or twice a year.  Routine eye exams. Ask your health care provider how often you should have your eyes checked.  Personal lifestyle choices, including: ? Daily care of your teeth and gums. ? Regular physical activity. ? Eating a healthy diet. ? Avoiding tobacco and drug use. ? Limiting alcohol use. ? Practicing safe sex. ? Taking low-dose aspirin daily starting at age 58. ? Taking vitamin and mineral supplements as recommended by your health care provider. What happens during an annual well check? The services and screenings done by your health care provider during your annual well check will depend on your age, overall health, lifestyle risk factors, and family history of disease. Counseling Your health care provider may ask you questions about your:  Alcohol use.  Tobacco use.  Drug use.  Emotional well-being.  Home and relationship well-being.  Sexual activity.  Eating habits.  Work and work Statistician.  Method of birth control.  Menstrual cycle.  Pregnancy history.  Screening You may have the following tests or measurements:  Height, weight, and BMI.  Blood pressure.  Lipid and cholesterol levels. These may be checked every 5 years, or more frequently if you are over 81 years old.  Skin check.  Lung cancer screening. You may have this screening every year starting at age 78 if you have a 30-pack-year history of smoking and currently smoke or have quit within the past 15 years.  Fecal occult blood test (FOBT) of the stool. You may have this test every year starting at age 65.  Flexible sigmoidoscopy or colonoscopy. You may have a sigmoidoscopy every 5 years or a colonoscopy  every 10 years starting at age 30.  Hepatitis C blood test.  Hepatitis B blood test.  Sexually transmitted disease (STD) testing.  Diabetes screening. This is done by checking your blood sugar (glucose) after you have not eaten for a while (fasting). You may have this done every 1-3 years.  Mammogram. This may be done every 1-2 years. Talk to your health care provider about when you should start having regular mammograms. This may depend on whether you have a family history of breast cancer.  BRCA-related cancer screening. This may be done if you have a family history of breast, ovarian, tubal, or peritoneal cancers.  Pelvic exam and Pap test. This may be done every 3 years starting at age 80. Starting at age 36, this may be done every 5 years if you have a Pap test in combination with an HPV test.  Bone density scan. This is done to screen for osteoporosis. You may have this scan if you are at high risk for osteoporosis.  Discuss your test results, treatment options, and if necessary, the need for more tests with your health care provider. Vaccines Your health care provider may recommend certain vaccines, such as:  Influenza vaccine. This is recommended every year.  Tetanus, diphtheria, and acellular pertussis (Tdap, Td) vaccine. You may need a Td booster every 10 years.  Varicella vaccine. You may need this if you have not been vaccinated.  Zoster vaccine. You may need this after age 5.  Measles, mumps, and rubella (MMR) vaccine. You may need at least one dose of MMR if you were born in  1957 or later. You may also need a second dose.  Pneumococcal 13-valent conjugate (PCV13) vaccine. You may need this if you have certain conditions and were not previously vaccinated.  Pneumococcal polysaccharide (PPSV23) vaccine. You may need one or two doses if you smoke cigarettes or if you have certain conditions.  Meningococcal vaccine. You may need this if you have certain  conditions.  Hepatitis A vaccine. You may need this if you have certain conditions or if you travel or work in places where you may be exposed to hepatitis A.  Hepatitis B vaccine. You may need this if you have certain conditions or if you travel or work in places where you may be exposed to hepatitis B.  Haemophilus influenzae type b (Hib) vaccine. You may need this if you have certain conditions.  Talk to your health care provider about which screenings and vaccines you need and how often you need them. This information is not intended to replace advice given to you by your health care provider. Make sure you discuss any questions you have with your health care provider. Document Released: 05/26/2015 Document Revised: 01/17/2016 Document Reviewed: 02/28/2015 Elsevier Interactive Patient Education  2018 Elsevier Inc.  

## 2017-11-10 ENCOUNTER — Other Ambulatory Visit (INDEPENDENT_AMBULATORY_CARE_PROVIDER_SITE_OTHER): Payer: 59

## 2017-11-10 ENCOUNTER — Ambulatory Visit (HOSPITAL_BASED_OUTPATIENT_CLINIC_OR_DEPARTMENT_OTHER)
Admission: RE | Admit: 2017-11-10 | Discharge: 2017-11-10 | Disposition: A | Discharge status: Home / Self Care | Payer: 59 | Source: Ambulatory Visit | Attending: Family Medicine | Admitting: Family Medicine

## 2017-11-10 ENCOUNTER — Ambulatory Visit: Payer: 59 | Admitting: Family Medicine

## 2017-11-10 ENCOUNTER — Encounter: Payer: Self-pay | Admitting: Family Medicine

## 2017-11-10 VITALS — BP 130/80 | HR 56 | Temp 98.0°F | Ht 69.5 in | Wt 207.4 lb

## 2017-11-10 DIAGNOSIS — R599 Enlarged lymph nodes, unspecified: Secondary | ICD-10-CM

## 2017-11-10 DIAGNOSIS — Z1231 Encounter for screening mammogram for malignant neoplasm of breast: Secondary | ICD-10-CM | POA: Diagnosis not present

## 2017-11-10 DIAGNOSIS — Z0000 Encounter for general adult medical examination without abnormal findings: Secondary | ICD-10-CM

## 2017-11-10 DIAGNOSIS — Z1239 Encounter for other screening for malignant neoplasm of breast: Secondary | ICD-10-CM

## 2017-11-10 LAB — CBC WITH DIFFERENTIAL/PLATELET
Basophils Absolute: 0 K/uL (ref 0.0–0.1)
Basophils Relative: 0.7 % (ref 0.0–3.0)
Eosinophils Absolute: 0.2 K/uL (ref 0.0–0.7)
Eosinophils Relative: 2.7 % (ref 0.0–5.0)
HCT: 40.8 % (ref 36.0–46.0)
Hemoglobin: 13.7 g/dL (ref 12.0–15.0)
Lymphocytes Relative: 31.7 % (ref 12.0–46.0)
Lymphs Abs: 1.8 K/uL (ref 0.7–4.0)
MCHC: 33.5 g/dL (ref 30.0–36.0)
MCV: 89.8 fl (ref 78.0–100.0)
Monocytes Absolute: 0.5 K/uL (ref 0.1–1.0)
Monocytes Relative: 8 % (ref 3.0–12.0)
Neutro Abs: 3.3 K/uL (ref 1.4–7.7)
Neutrophils Relative %: 56.9 % (ref 43.0–77.0)
Platelets: 299 K/uL (ref 150.0–400.0)
RBC: 4.54 Mil/uL (ref 3.87–5.11)
RDW: 14 % (ref 11.5–15.5)
WBC: 5.8 K/uL (ref 4.0–10.5)

## 2017-11-10 LAB — LIPID PANEL
Cholesterol: 203 mg/dL — ABNORMAL HIGH (ref 0–200)
HDL: 59.9 mg/dL (ref >39.00)
LDL Cholesterol: 121 mg/dL — ABNORMAL HIGH (ref 0–99)
NonHDL: 142.77
Total CHOL/HDL Ratio: 3
Triglycerides: 110 mg/dL (ref 0.0–149.0)
VLDL: 22 mg/dL (ref 0.0–40.0)

## 2017-11-10 LAB — COMPREHENSIVE METABOLIC PANEL WITH GFR
ALT: 86 U/L — ABNORMAL HIGH (ref 0–35)
AST: 47 U/L — ABNORMAL HIGH (ref 0–37)
Albumin: 4.2 g/dL (ref 3.5–5.2)
Alkaline Phosphatase: 65 U/L (ref 39–117)
BUN: 19 mg/dL (ref 6–23)
CO2: 26 meq/L (ref 19–32)
Calcium: 9 mg/dL (ref 8.4–10.5)
Chloride: 108 meq/L (ref 96–112)
Creatinine, Ser: 0.75 mg/dL (ref 0.40–1.20)
GFR: 83.43 mL/min (ref >60.00)
Glucose, Bld: 119 mg/dL — ABNORMAL HIGH (ref 70–99)
Potassium: 4.3 meq/L (ref 3.5–5.1)
Sodium: 141 meq/L (ref 135–145)
Total Bilirubin: 0.4 mg/dL (ref 0.2–1.2)
Total Protein: 6.2 g/dL (ref 6.0–8.3)

## 2017-11-10 LAB — TSH: TSH: 3.27 u[IU]/mL (ref 0.35–4.50)

## 2017-11-10 NOTE — Progress Notes (Signed)
Pre visit review using our clinic review tool, if applicable. No additional management support is needed unless otherwise documented below in the visit note. 

## 2017-11-10 NOTE — Progress Notes (Signed)
Chief Complaint  Patient presents with  . Adenopathy    congestion  . Cough    Wilmer Floorherese S Ellender here for URI complaints.  Duration: 1 month; 1 week Associated symptoms: sore throat and swollen glands in throat Denies: sinus congestion, sinus pain, rhinorrhea, itchy watery eyes, ear pain, ear drainage, wheezing, shortness of breath, myalgia and fevers Treatment to date: Zpak, Augmentin Sick contacts: No  Concerned she may have mono.  ROS:  Const: Denies fevers HEENT: As noted in HPI Lungs: No SOB  Past Medical History:  Diagnosis Date  . Menopause   . Thyroid disease    hypo   Family History  Problem Relation Age of Onset  . Alcohol abuse Mother   . Parkinsonism Mother   . Hypertension Father   . Cancer Father [id number]       prostate  . Stroke Brother     BP 130/80 (BP Location: Left Arm, Patient Position: Sitting, Cuff Size: Normal)   Pulse (!) 56   Temp 98 F (36.7 C) (Oral)   Ht 5' 9.5" (1.765 m)   Wt 207 lb 6 oz (94.1 kg)   SpO2 97%   BMI 30.18 kg/m  General: Awake, alert, appears stated age HEENT: AT, [place], ears patent b/l and TM's neg, nares patent w/o discharge, pharynx pink and without exudates, MMM Neck: prominent submand glands b/l, LN's appreciated but no fluctuance or >1 cm in size Heart: RRR Lungs: CTAB, no accessory muscle use Psych: Age appropriate judgment and insight, normal mood and affect  Glands swollen - Plan: Epstein-Barr virus VCA antibody panel  Orders as above. 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 Will cancel flight plans if positive. Supportive care otherwise. F/u prn. If starting to experience fevers, shaking, or shortness of breath, seek immediate care. Pt voiced understanding and agreement to the plan.  Jilda Rocheicholas Paul Waverly HallWendling, DO 11/10/17 11:06 AM

## 2017-11-10 NOTE — Patient Instructions (Addendum)
3-4 days to get the results of your labs back.  This could be remnants of the illnesses you had and may just need more time.  Let us know if you need anything.

## 2017-11-11 LAB — EPSTEIN-BARR VIRUS VCA ANTIBODY PANEL
EBV NA IGG: 127 U/mL — AB
EBV VCA IgG: 419 U/mL — ABNORMAL HIGH
EBV VCA IgM: <36 U/mL

## 2017-11-12 ENCOUNTER — Telehealth: Payer: Self-pay

## 2017-11-12 DIAGNOSIS — R739 Hyperglycemia, unspecified: Secondary | ICD-10-CM

## 2017-11-12 DIAGNOSIS — R748 Abnormal levels of other serum enzymes: Secondary | ICD-10-CM

## 2017-11-12 NOTE — Telephone Encounter (Signed)
Author phoned pt. to relay lab result notes from Sandford CrazeMelissa O'Sullivan, NP. Labs resulted 7/1 in the AM, too late to add on A1C. Need for repeat labwork X 1 mo. Future orders for A1C and liver function test made per Sandford CrazeMelissa O'Sullivan, and lab appointment for 8/7 made.

## 2017-11-12 NOTE — Telephone Encounter (Signed)
Noted  

## 2017-11-12 NOTE — Telephone Encounter (Signed)
-----   Message from Sandford CrazeMelissa O'Sullivan, NP sent at 11/10/2017 12:55 PM EDT ----- Covering for Lowne- sugar is rising and cholesterol is mildly elevated. Please advise pt to work on diet/exercise/weight loss and ask the lab to add on A1C, dx hyperglycemia. LFT are also elevated.  Could be due to fatty liver (diet/exercise and weight loss can help this). Please repeat lft in 1 month, dx elevated LFT.

## 2017-11-14 ENCOUNTER — Other Ambulatory Visit: Payer: Self-pay | Admitting: Family Medicine

## 2017-11-14 MED FILL — LEVOTHYROXINE 112 MCG TAB: 112 | 90 days supply | Qty: 90 | Fill #0

## 2017-11-17 ENCOUNTER — Other Ambulatory Visit: Payer: Self-pay | Admitting: Family Medicine

## 2017-11-18 MED FILL — FLUoxetine HCL 10 MG TABS: 10 | 90 days supply | Qty: 90 | Fill #0

## 2017-11-20 ENCOUNTER — Encounter: Payer: 59 | Admitting: Family Medicine

## 2017-12-02 ENCOUNTER — Telehealth: Payer: Self-pay | Admitting: *Deleted

## 2017-12-02 NOTE — Telephone Encounter (Signed)
Called and left a message for the patient per Dr Waldo LaineWagoner-just to see how the patient was doing after we discharged her(foot surgery) and to call the [place] office at [phone]. Misty StanleyLisa

## 2017-12-17 ENCOUNTER — Other Ambulatory Visit (INDEPENDENT_AMBULATORY_CARE_PROVIDER_SITE_OTHER): Payer: 59

## 2017-12-17 ENCOUNTER — Encounter: Payer: Self-pay | Admitting: Family

## 2017-12-17 DIAGNOSIS — R748 Abnormal levels of other serum enzymes: Secondary | ICD-10-CM

## 2017-12-17 DIAGNOSIS — R739 Hyperglycemia, unspecified: Secondary | ICD-10-CM | POA: Diagnosis not present

## 2017-12-17 LAB — HEPATIC FUNCTION PANEL
ALT: 33 U/L (ref 0–35)
AST: 23 U/L (ref 0–37)
Albumin: 4.4 g/dL (ref 3.5–5.2)
Alkaline Phosphatase: 60 U/L (ref 39–117)
BILIRUBIN DIRECT: 0.1 mg/dL (ref 0.0–0.3)
BILIRUBIN TOTAL: 0.7 mg/dL (ref 0.2–1.2)
Total Protein: 6.5 g/dL (ref 6.0–8.3)

## 2017-12-17 LAB — HEMOGLOBIN A1C: Hgb A1c MFr Bld: 5.9 % (ref 4.6–6.5)

## 2018-01-27 ENCOUNTER — Encounter (INDEPENDENT_AMBULATORY_CARE_PROVIDER_SITE_OTHER): Payer: 59

## 2018-02-02 ENCOUNTER — Ambulatory Visit (INDEPENDENT_AMBULATORY_CARE_PROVIDER_SITE_OTHER): Payer: 59 | Admitting: Bariatrics

## 2018-02-02 ENCOUNTER — Encounter (INDEPENDENT_AMBULATORY_CARE_PROVIDER_SITE_OTHER): Payer: Self-pay | Admitting: Bariatrics

## 2018-02-02 VITALS — BP 134/78 | HR 58 | Temp 97.7°F | Ht 70.0 in | Wt 207.0 lb

## 2018-02-02 DIAGNOSIS — Z9189 Other specified personal risk factors, not elsewhere classified: Secondary | ICD-10-CM

## 2018-02-02 DIAGNOSIS — Z6830 Body mass index (BMI) 30.0-30.9, adult: Secondary | ICD-10-CM

## 2018-02-02 DIAGNOSIS — E038 Other specified hypothyroidism: Secondary | ICD-10-CM | POA: Diagnosis not present

## 2018-02-02 DIAGNOSIS — R7303 Prediabetes: Secondary | ICD-10-CM | POA: Diagnosis not present

## 2018-02-02 DIAGNOSIS — Z0289 Encounter for other administrative examinations: Secondary | ICD-10-CM

## 2018-02-02 DIAGNOSIS — F3289 Other specified depressive episodes: Secondary | ICD-10-CM

## 2018-02-02 DIAGNOSIS — E669 Obesity, unspecified: Secondary | ICD-10-CM | POA: Diagnosis not present

## 2018-02-02 DIAGNOSIS — R5383 Other fatigue: Secondary | ICD-10-CM

## 2018-02-02 DIAGNOSIS — E559 Vitamin D deficiency, unspecified: Secondary | ICD-10-CM | POA: Diagnosis not present

## 2018-02-02 DIAGNOSIS — R0602 Shortness of breath: Secondary | ICD-10-CM | POA: Diagnosis not present

## 2018-02-03 LAB — COMPREHENSIVE METABOLIC PANEL
A/G RATIO: 2.6 — AB (ref 1.2–2.2)
ALT: 37 IU/L — AB (ref 0–32)
AST: 25 IU/L (ref 0–40)
Albumin: 5 g/dL — ABNORMAL HIGH (ref 3.6–4.8)
Alkaline Phosphatase: 74 IU/L (ref 39–117)
BUN/Creatinine Ratio: 23 (ref 12–28)
BUN: 18 mg/dL (ref 8–27)
Bilirubin Total: 0.4 mg/dL (ref 0.0–1.2)
CALCIUM: 10.2 mg/dL (ref 8.7–10.3)
CO2: 23 mmol/L (ref 20–29)
Chloride: 101 mmol/L (ref 96–106)
Creatinine, Ser: 0.77 mg/dL (ref 0.57–1.00)
GFR calc Af Amer: 96 mL/min/{1.73_m2} (ref >59)
GFR, EST NON AFRICAN AMERICAN: 84 mL/min/{1.73_m2} (ref >59)
GLUCOSE: 93 mg/dL (ref 65–99)
Globulin, Total: 1.9 g/dL (ref 1.5–4.5)
POTASSIUM: 4.5 mmol/L (ref 3.5–5.2)
Sodium: 139 mmol/L (ref 134–144)
TOTAL PROTEIN: 6.9 g/dL (ref 6.0–8.5)

## 2018-02-03 LAB — VITAMIN D 25 HYDROXY (VIT D DEFICIENCY, FRACTURES): VIT D 25 HYDROXY: 27.3 ng/mL — AB (ref 30.0–100.0)

## 2018-02-03 LAB — TSH: TSH: 2.85 u[IU]/mL (ref 0.450–4.500)

## 2018-02-03 LAB — FOLATE: FOLATE: 10.4 ng/mL (ref >3.0)

## 2018-02-03 LAB — VITAMIN B12: VITAMIN B 12: 475 pg/mL (ref 232–1245)

## 2018-02-03 LAB — T4, FREE: FREE T4: 1.49 ng/dL (ref 0.82–1.77)

## 2018-02-03 LAB — INSULIN, RANDOM: INSULIN: 10.1 u[IU]/mL (ref 2.6–24.9)

## 2018-02-03 LAB — T3: T3 TOTAL: 100 ng/dL (ref 71–180)

## 2018-02-03 NOTE — Progress Notes (Signed)
Office: [phone]  /  Fax: [phone]   Dear Dr. Zola ButtonLowne-Chase,   Thank you for referring Michelle Pratt to our clinic. The following note includes my evaluation and treatment recommendations.  HPI:   Chief Complaint: OBESITY    Michelle Pratt has been referred by Michelle SchultzYvonne R. Lowne-Chase, DO for consultation regarding her obesity and obesity related comorbidities.    Michelle Pratt (MR# [id number]) is a 62 y.o. female who presents on 02/03/2018 for obesity evaluation and treatment. Current BMI is Body mass index is 29.7 kg/m.[name]. Michelle Pratt has been struggling with her weight for many years and has been unsuccessful in either losing weight, maintaining weight loss, or reaching her healthy weight goal.     Michelle Pratt attended our information session and states she is currently in the action stage of change and ready to dedicate time achieving and maintaining a healthier weight. Michelle Pratt is interested in becoming our patient and working on intensive lifestyle modifications including (but not limited to) diet, exercise and weight loss.    Michelle Pratt states her family eats meals together she thinks her family will eat healthier with  her she started gaining weight after getting married her heaviest weight ever was 210 lbs. she has significant food cravings issues  she snacks frequently in the evenings she skips meals frequently she is frequently drinking liquids with calories she frequently makes poor food choices she has problems with excessive hunger  she frequently eats larger portions than normal  she has binge eating behaviors she struggles with emotional eating    Fatigue Michelle Pratt feels her energy is lower than it should be. This has worsened with weight gain and has not worsened recently. Michelle Pratt admits to daytime somnolence and admits to waking up still tired. Patient is at risk for obstructive sleep apnea. Patent has a history of symptoms of daytime fatigue, morning fatigue and morning  headache. Patient generally gets 6 or 7 hours of sleep per night, and states they generally have restless sleep. Snoring is present. Apneic episodes are present. Epworth Sleepiness Score is 8  Dyspnea on exertion Michelle Pratt notes increasing shortness of breath with certain exercises and seems to be worsening over time with weight gain. She notes getting out of breath sooner with activity than she used to. This has not gotten worse recently. Michelle Pratt denies orthopnea.  Vitamin D deficiency Michelle Pratt has a diagnosis of vitamin D deficiency. She is not currently taking vit D and she has not been on vit D supplements in the past. Michelle Pratt denies nausea, vomiting or muscle weakness.  At risk for osteopenia and osteoporosis Michelle Pratt is at higher risk of osteopenia and osteoporosis due to vitamin D deficiency.   Pre-Diabetes Michelle Pratt has a diagnosis of prediabetes based on her elevated Hgb A1c of 5.9 on 12/17/17 and was informed this puts her at greater risk of developing diabetes. Michelle Pratt has increased her portion size. Michelle Pratt is not taking metformin currently and she is attempting to work on diet and exercise to decrease risk of diabetes. She has slightly elevated polyphagia and denies nausea or hypoglycemia.  Hypothyroidism Michelle Pratt has a diagnosis of hypothyroidism. Michelle Pratt states her last thyroid labs were within normal limits. She is taking levothyroxine without side effects. She denies hot or cold intolerance or palpitations, but does admit to ongoing fatigue.  Depression with emotional eating behaviors Michelle Pratt is struggling with emotional eating and using food for comfort to the extent that it is negatively impacting her health. She often snacks when she is not hungry.  Michelle Pratt sometimes feels she is out of control and then feels guilty that she made poor food choices. She is attempting to work on behavior modification techniques to help reduce her emotional eating. She shows no sign of suicidal or homicidal  ideations.  Depression screen [facility] 2/9 02/02/2018 11/07/2017 09/26/2015 03/11/2012  Decreased Interest 3 0 0 0  Down, Depressed, Hopeless 3 0 0 0  PHQ - 2 Score 6 0 0 0  Altered sleeping 3 - - -  Tired, decreased energy 3 - - -  Change in appetite 3 - - -  Feeling bad or failure about yourself  3 - - -  Trouble concentrating 1 - - -  Moving slowly or fidgety/restless 1 - - -  Suicidal thoughts 1 - - -  PHQ-9 Score 21 - - -  Difficult doing work/chores Somewhat difficult - - -     Depression Screen Michelle Pratt's Food and Mood (modified PHQ-9) score was  Depression screen PHQ 2/9 02/02/2018  Decreased Interest 3  Down, Depressed, Hopeless 3  PHQ - 2 Score 6  Altered sleeping 3  Tired, decreased energy 3  Change in appetite 3  Feeling bad or failure about yourself  3  Trouble concentrating 1  Moving slowly or fidgety/restless 1  Suicidal thoughts 1  PHQ-9 Score 21  Difficult doing work/chores Somewhat difficult    ALLERGIES: Allergies  Allergen Reactions  . Bee Venom Anaphylaxis and Swelling  . Morphine Nausea And Vomiting    MEDICATIONS: Current Outpatient Medications on File Prior to Visit  Medication Sig Dispense Refill  . acetaminophen (TYLENOL) 325 MG tablet Take 650 mg by mouth every 6 (six) hours as needed.    . Calcium Carb-Cholecalciferol (CALCIUM 1000 + D PO) Take 1 tablet by mouth daily.    [name] EPINEPHrine (EPIPEN) 0.3 mg/0.3 mL IJ SOAJ injection Inject 0.3 mLs (0.3 mg total) into the muscle once as needed (allergic reaction). 1 Device 0  . FLUoxetine (PROZAC) 10 MG tablet TAKE 1 TABLET BY MOUTH DAILY. 90 tablet 1  . levothyroxine (SYNTHROID, LEVOTHROID) 112 MCG tablet TAKE 1 TABLET BY MOUTH ONCE DAILY 90 tablet 1  . Melatonin 3 MG CAPS Take 1 capsule by mouth.    . NON FORMULARY CBD oil 500 mg PRN for sleep     No current facility-administered medications on file prior to visit.     PAST MEDICAL HISTORY: Past Medical History:  Diagnosis Date  . Depression   .  Glaucoma   . Joint pain   . Menopause   . Thyroid disease    hypo  . Vitamin D deficiency     PAST SURGICAL HISTORY: Past Surgical History:  Procedure Laterality Date  . oraf     left eyebrow  . ORIF ELBOW FRACTURE    . TOE SURGERY Right    right great toe  . TONSILLECTOMY    . TUBAL LIGATION      SOCIAL HISTORY: Social History   Tobacco Use  . Smoking status: Former Games developer  . Smokeless tobacco: Never Used  . Tobacco comment: over 20 years ago  Substance Use Topics  . Alcohol use: Yes    Alcohol/week: 3.0 standard drinks    Types: 3 Glasses of wine per week  . Drug use: No    FAMILY HISTORY: Family History  Problem Relation Age of Onset  . Alcohol abuse Mother   . Parkinsonism Mother   . Hypertension Father   . Cancer Father 20  prostate  . Hyperlipidemia Father   . Stroke Brother     ROS: Review of Systems  Constitutional: Positive for malaise/fatigue.  Eyes:       + Vision Changes + Wear Glasses or Contacts  Respiratory: Positive for shortness of breath (on exertion).   Cardiovascular: Negative for orthopnea.  Gastrointestinal: Positive for constipation, diarrhea and heartburn. Negative for nausea and vomiting.  Musculoskeletal:       + Muscle or Joint Pain Negative for muscle weakness  Skin:       + Hair or Nail Changes  Endo/Heme/Allergies:       + Polyphagia Negative for Hot or Cold Intolerance Negative for hypoglycemia  Psychiatric/Behavioral: Positive for depression. Negative for suicidal ideas. The patient has insomnia.     PHYSICAL EXAM: Blood pressure 134/78, pulse (!) 58, temperature 97.7 F (36.5 C), temperature source Oral, height 5\' 10"  (1.778 m), weight 207 lb (93.9 kg), SpO2 93 %. Body mass index is 29.7 kg/m. Physical Exam  Constitutional: She is oriented to person, place, and time. She appears well-developed and well-nourished.  HENT:  Head: Normocephalic and atraumatic.  Nose: Nose normal.  Eyes: EOM are normal. No  scleral icterus.  Neck: Normal range of motion. Neck supple. No thyromegaly present.  Cardiovascular: Regular rhythm.  Pulmonary/Chest: Effort normal. No respiratory distress.  Abdominal: Soft. There is no tenderness.  + Obesity  Musculoskeletal: Normal range of motion.  Range of Motion normal in all 4 extremities  Neurological: She is alert and oriented to person, place, and time. Coordination normal.  Skin: Skin is warm and dry.  Psychiatric: She has a normal mood and affect. Her behavior is normal.  Vitals reviewed.   RECENT LABS AND TESTS: BMET    Component Value Date/Time   NA 139 02/02/2018 0947   K 4.5 02/02/2018 0947   CL 101 02/02/2018 0947   CO2 23 02/02/2018 0947   GLUCOSE 93 02/02/2018 0947   GLUCOSE 119 (H) 11/10/2017 0753   BUN 18 02/02/2018 0947   CREATININE 0.77 02/02/2018 0947   CALCIUM 10.2 02/02/2018 0947   GFRNONAA 84 02/02/2018 0947   GFRAA 96 02/02/2018 0947   Lab Results  Component Value Date   HGBA1C 5.9 12/17/2017   Lab Results  Component Value Date   INSULIN 10.1 02/02/2018   CBC    Component Value Date/Time   WBC 5.8 11/10/2017 0753   RBC 4.54 11/10/2017 0753   HGB 13.7 11/10/2017 0753   HCT 40.8 11/10/2017 0753   PLT 299.0 11/10/2017 0753   MCV 89.8 11/10/2017 0753   MCHC 33.5 11/10/2017 0753   RDW 14.0 11/10/2017 0753   LYMPHSABS 1.8 11/10/2017 0753   MONOABS 0.5 11/10/2017 0753   EOSABS 0.2 11/10/2017 0753   BASOSABS 0.0 11/10/2017 0753   Iron/TIBC/Ferritin/ %Sat No results found for: IRON, TIBC, FERRITIN, IRONPCTSAT Lipid Panel     Component Value Date/Time   CHOL 203 (H) 11/10/2017 0753   TRIG 110.0 11/10/2017 0753   HDL 59.90 11/10/2017 0753   CHOLHDL 3 11/10/2017 0753   VLDL 22.0 11/10/2017 0753   LDLCALC 121 (H) 11/10/2017 0753   LDLDIRECT 133.3 03/11/2012 1112   Hepatic Function Panel     Component Value Date/Time   PROT 6.9 02/02/2018 0947   ALBUMIN 5.0 (H) 02/02/2018 0947   AST 25 02/02/2018 0947   ALT 37  (H) 02/02/2018 0947   ALKPHOS 74 02/02/2018 0947   BILITOT 0.4 02/02/2018 0947   BILIDIR 0.1 12/17/2017 9811  Component Value Date/Time   TSH 2.850 02/02/2018 0947   TSH 3.27 11/10/2017 0753   TSH 1.46 11/12/2016 0819    ECG  shows NSR with a rate of 53 BPM INDIRECT CALORIMETER done today shows a VO2 of 264 and a REE of 1843.  Her calculated basal metabolic rate is 1610 thus her basal metabolic rate is better than expected.    ASSESSMENT AND PLAN: Other fatigue - Plan: EKG 12-Lead, Vitamin B12, Folate  Shortness of breath on exertion  Other specified hypothyroidism - Plan: T3, T4, free, TSH  Vitamin D deficiency - Plan: VITAMIN D 25 Hydroxy (Vit-D Deficiency, Fractures)  Prediabetes - Plan: Comprehensive metabolic panel, Insulin, random  Other depression - with emotional eating  At risk for osteoporosis  Class 1 obesity with serious comorbidity and body mass index (BMI) of 30.0 to 30.9 in adult, unspecified obesity type - Starting BMI greater then 30  PLAN: Fatigue Wanona was informed that her fatigue may be related to obesity, depression or many other causes. Labs will be ordered, and in the meanwhile Sherion has agreed to work on diet and weight loss to help with fatigue. She will slowly increase exercise over time. Proper sleep hygiene was discussed including the need for 7-8 hours of quality sleep each night. A sleep study was not ordered based on symptoms and Epworth score.  Dyspnea on exertion Ebonique's shortness of breath appears to be obesity related and exercise induced. She has agreed to work on weight loss and will slowly increase exercise (cardio and resistance) over time to treat her exercise induced shortness of breath. If Rozlynn follows our instructions and loses weight without improvement of her shortness of breath, we will plan to refer to pulmonology. We will monitor this condition regularly. Irving agrees to this plan.  Vitamin D Deficiency Colie  was informed that low vitamin D levels contributes to fatigue and are associated with obesity, breast, and colon cancer. We will check vitamin D level today and she will follow up for routine testing of vitamin D, at least 2-3 times per year. Jamilee will increase vitamin D rich foods and follow up as directed.  At risk for osteopenia and osteoporosis Benay was given extended  (15 minutes) osteoporosis prevention counseling today. Jasara is at risk for osteopenia and osteoporosis due to her vitamin D deficiency. She was encouraged to take her vitamin D and follow her higher calcium diet and increase strengthening exercise to help strengthen her bones and decrease her risk of osteopenia and osteoporosis.  Pre-Diabetes Morayo will work on weight loss, exercise, and decreasing simple carbohydrates in her diet to help decrease the risk of diabetes. She was informed that eating too many simple carbohydrates or too many calories at one sitting increases the likelihood of GI side effects. Radley will work on increasing her lean protein intake and she will not skip meals. Beckie will follow up with [place] as directed to monitor her progress.  Hypothyroidism Phenix was informed of the importance of good thyroid control to help with weight loss efforts. She was also informed that supertheraputic thyroid levels are dangerous and will not improve weight loss results.  Remmy will continue to take levothyroxine and follow up as directed.  Depression with Emotional Eating Behaviors We discussed behavior modification techniques today to help Odette deal with her emotional eating and depression. We will refer to Dr. Dewaine Conger, our bariatric psychologist and Maryela will follow up with our clinic in 2 weeks.  Depression Screen Tamyrah had a  strongly positive depression screening. Depression is commonly associated with obesity and often results in emotional eating behaviors. We will monitor this closely and work on  CBT to help improve the non-hunger eating patterns. Referral to Psychology may be required if no improvement is seen as she continues in our clinic.  Obesity Autumm is currently in the action stage of change and her goal is to continue with weight loss efforts. I recommend Joella begin the structured treatment plan as follows:  She has agreed to follow the Category 3 plan Abbagail has been instructed to eventually work up to a goal of 150 minutes of combined cardio and strengthening exercise per week for weight loss and overall health benefits. We discussed the following Behavioral Modification Strategies today: increase H2O intake, no skipping meals, increasing lean protein intake, decreasing simple carbohydrates , increasing vegetables and ways to avoid night time snacking   She was informed of the importance of frequent follow up visits to maximize her success with intensive lifestyle modifications for her multiple health conditions. She was informed we would discuss her lab results at her next visit unless there is a critical issue that needs to be addressed sooner. Rick agreed to keep her next visit at the agreed upon time to discuss these results.    OBESITY BEHAVIORAL INTERVENTION VISIT  Today's visit was # 1   Starting weight: 207 lbs Starting date: 02/02/18 Today's weight : 207 lbs Today's date: 02/02/2018 Total lbs lost to date: 0   ASK: We discussed the diagnosis of obesity with Michelle Nipple today and Kerry-Anne agreed to give [place] permission to discuss obesity behavioral modification therapy today.  ASSESS: Addalee has the diagnosis of obesity and her BMI today is 29.7 Shaylynne is in the action stage of change   ADVISE: Henli was educated on the multiple health risks of obesity as well as the benefit of weight loss to improve her health. She was advised of the need for long term treatment and the importance of lifestyle modifications to improve her current health and to  decrease her risk of future health problems.  AGREE: Multiple dietary modification options and treatment options were discussed and  Chevonne agreed to follow the recommendations documented in the above note.  ARRANGE: Makhya was educated on the importance of frequent visits to treat obesity as outlined per CMS and USPSTF guidelines and agreed to schedule her next follow up appointment today.  Cristi Loron, am acting as Energy manager for [employer]. Manson Passey, DO  I have reviewed the above documentation for accuracy and completeness, and I agree with the above. -Corinna Capra, DO

## 2018-02-10 NOTE — Progress Notes (Unsigned)
Office: [phone]  /  Fax: [phone] Date: February 16, 2018 Time Seen: *** Duration: *** Provider: Lawerance Cruel, PsyD Type of Session: Intake for Individual Therapy   Informed Consent: The provider's role was explained to [facility]. The provider reviewed and discussed issues of confidentiality, privacy, and limits therein. Since the clinic is not a 24/7 crisis center, mental health emergency resources were shared and a handout was provided. Michelle Pratt verbally acknowledged understanding, and agreed to use mental health emergency resources discussed if needed. In addition to written consent, verbal informed consent for psychological services was obtained from Michelle Pratt prior to the initial intake interview. Moreover, Michelle Pratt agreed information may be shared with other [facility] providers as needed for coordination of care. Written consent was also provided for this provider to coordinate care with other providers at [facility].   Chief Complaint: Michelle Pratt was referred by Michelle Pratt due to depression with emotional eating behaviors. Per the note for the initial visit with Michelle Pratt on February 02, 2018, "Island is struggling with emotional eating and using food for comfort to the extent that it is negatively impacting her health. She often snacks when she is not hungry. Danilynn sometimes feels she is out of control and then feels guilty that she made poor food choices. She is attempting to work on behavior modification techniques to help reduce her emotional eating. She shows no sign of suicidal or homicidal ideations."  Michelle Pratt was asked to complete a questionnaire assessing various behaviors related to emotional eating. Michelle Pratt endorsed the following: {gbmoodandfood:21755}.  HPI: Per the note for the initial visit with Michelle Pratt on February 02, 2018, Michelle Pratt started gaining weight after getting married and her heaviest weight ever was  210 pounds. During the initial appointment with Dr. Manson Passey, Michelle Pratt reported experiencing the following: significant food cravings issues; snacking frequently in the evening; skipping meals frequently; frequently drinking liquids with calories; frequently making poor food choices; having problems with excessive hunger; frequently eating larger portions than normal; binge eating behaviors; and struggling with emotional eating.  Mental Status Examination: Michelle Pratt arrived on time for the appointment. She presented as appropriately dressed and groomed. Michelle Pratt appeared her stated age and demonstrated adequate orientation to time, place, person, and purpose of the appointment. She also demonstrated appropriate eye contact. No psychomotor abnormalities or behavioral peculiarities noted. Her mood was {gbmood:21757} with congruent affect. Her thought processes were logical, linear, and goal-directed. No hallucinations, delusions, bizarre thinking or behavior reported or observed. Judgment, insight, and impulse control appeared to be grossly intact. There was no evidence of paraphasias (i.e., errors in speech, gross mispronunciations, and word substitutions), repetition deficits, or disturbances in volume or prosody (i.e., rhythm and intonation). There was no evidence of attention or memory impairments. Michelle Pratt denied current suicidal and homicidal ideation, plan, and intent.   The Montreal Cognitive Assessment (MoCA) was administered. The MoCA assesses different cognitive domains: attention and concentration, executive functions, memory, language, visuoconstructional skills, conceptual thinking, calculations, and orientation. Michelle Pratt received *** out of 30 points possible on the MoCA, which is noted in the *** range. ***[A point was added to the total score due to years of formal education being 12 years or fewer.] The following points were lost: ***  Family & Psychosocial History: ***  Medical History:  Past  Medical History:  Diagnosis Date  . Depression   . Glaucoma   . Joint pain   . Menopause   . Thyroid disease  hypo  . Vitamin D deficiency    Past Surgical History:  Procedure Laterality Date  . oraf     left eyebrow  . ORIF ELBOW FRACTURE    . TOE SURGERY Right    right great toe  . TONSILLECTOMY    . TUBAL LIGATION     Current Outpatient Medications on File Prior to Visit  Medication Sig Dispense Refill  . acetaminophen (TYLENOL) 325 MG tablet Take 650 mg by mouth every 6 (six) hours as needed.    . Calcium Carb-Cholecalciferol (CALCIUM 1000 + D PO) Take 1 tablet by mouth daily.    [name] EPINEPHrine (EPIPEN) 0.3 mg/0.3 mL IJ SOAJ injection Inject 0.3 mLs (0.3 mg total) into the muscle once as needed (allergic reaction). 1 Device 0  . FLUoxetine (PROZAC) 10 MG tablet TAKE 1 TABLET BY MOUTH DAILY. 90 tablet 1  . levothyroxine (SYNTHROID, LEVOTHROID) 112 MCG tablet TAKE 1 TABLET BY MOUTH ONCE DAILY 90 tablet 1  . Melatonin 3 MG CAPS Take 1 capsule by mouth.    . NON FORMULARY CBD oil 500 mg PRN for sleep     No current facility-administered medications on file prior to visit.     Mental Health History: ***  Structured Assessment Results: The Patient Health Questionnaire-9 (PHQ-9) is a self-report measure that assesses symptoms and severity of depression over the course of the last two weeks. Michelle Pratt obtained a score of *** suggesting {GBPHQ9SEVERITY:21752}. Michelle Pratt finds the endorsed symptoms to be {gbphq9difficulty:21754}.    The Generalized Anxiety Disorder-7 (GAD-7) is a brief self-report measure that assesses symptoms of anxiety over the course of the last two weeks. Michelle Pratt obtained a score of *** suggesting {gbgad7severity:21753}.    Interventions: A chart review was conducted prior to the clinical intake interview. The MoCA, PHQ-9, and GAD-7 were administered and a clinical intake interview was completed. In addition, Michelle Pratt was asked to complete a Mood and Food  questionnaire to assess various behaviors related to emotional eating. Throughout session, empathic reflections and validation was provided. Continuing treatment with this provider was discussed and a treatment goal was established. Psychoeducation regarding emotional versus physical hunger was provided. Michelle Pratt was given a handout to utilize between now and the next appointment to increase awareness of hunger patterns and subsequent eating. ***  Provisional DSM-5 Diagnosis: ***  Plan: Michelle Pratt expressed understanding and agreement with the initial treatment plan of care. She appears able and willing to participate as evidenced by collaboration on a treatment goal, engagement in reciprocal conversation, and asking questions as needed for clarification. The next appointment will be scheduled in {gbweeks:21758}. The following treatment goal was established: {gbtxgoals:21759}. For the aforementioned goal, Michelle Pratt can benefit from biweekly sessions that are brief in duration for approximately four to six sessions.

## 2018-02-16 ENCOUNTER — Ambulatory Visit (INDEPENDENT_AMBULATORY_CARE_PROVIDER_SITE_OTHER): Payer: 59 | Admitting: Psychology

## 2018-02-16 ENCOUNTER — Ambulatory Visit (INDEPENDENT_AMBULATORY_CARE_PROVIDER_SITE_OTHER): Payer: 59 | Admitting: Bariatrics

## 2018-02-16 VITALS — BP 132/78 | HR 51 | Temp 97.8°F | Ht 70.0 in | Wt 206.0 lb

## 2018-02-16 DIAGNOSIS — Z6830 Body mass index (BMI) 30.0-30.9, adult: Secondary | ICD-10-CM

## 2018-02-16 DIAGNOSIS — R7401 Elevation of levels of liver transaminase levels: Secondary | ICD-10-CM

## 2018-02-16 DIAGNOSIS — Z9189 Other specified personal risk factors, not elsewhere classified: Secondary | ICD-10-CM | POA: Diagnosis not present

## 2018-02-16 DIAGNOSIS — E669 Obesity, unspecified: Secondary | ICD-10-CM | POA: Diagnosis not present

## 2018-02-16 DIAGNOSIS — R740 Nonspecific elevation of levels of transaminase and lactic acid dehydrogenase [LDH]: Secondary | ICD-10-CM | POA: Diagnosis not present

## 2018-02-16 DIAGNOSIS — R7303 Prediabetes: Secondary | ICD-10-CM | POA: Diagnosis not present

## 2018-02-16 DIAGNOSIS — E559 Vitamin D deficiency, unspecified: Secondary | ICD-10-CM

## 2018-02-16 MED ORDER — VITAMIN D (ERGOCALCIFEROL) 1.25 MG (50000 UNIT) PO CAPS: 50000.0000 [IU] | ORAL_CAPSULE | ORAL | 0 refills | Status: DC

## 2018-02-16 MED FILL — LEVOTHYROXINE 112 MCG TAB: 112 | 90 days supply | Qty: 90 | Fill #1

## 2018-02-17 NOTE — Progress Notes (Signed)
Office: [phone]  /  Fax: [phone]   HPI:   Chief Complaint: OBESITY Michelle Pratt is here to discuss her progress with her obesity treatment plan. She is on the  follow the Category 3 plan and is following her eating plan approximately 35 % of the time. She states she is exercising by walking for 30 minutes 5 times per week. Michelle Pratt is currently struggling with minimal meal planning due to traveling. Reports she felt full with Category 3 plan. Denies hunger, cravings, and polyphagia.  Her weight is 206 lb (93.4 kg) today and has had a weight loss of 1 pound over a period of 2 weeks since her last visit. She has lost 1 lb since starting treatment with [place].  Vitamin D deficiency Michelle Pratt has a diagnosis of vitamin D deficiency. She is not currently taking vit D and denies nausea, vomiting or muscle weakness.   Ref. Range 02/02/2018 09:47  Vitamin D, 25-Hydroxy Latest Ref Range: 30.0 - 100.0 ng/mL 27.3 (L)   Pre-Diabetes Michelle Pratt has a diagnosis of prediabetes based on her elevated HgA1c of 5.9 and Insulin level of 10.1 and was informed this puts her at greater risk of developing diabetes. She is not taking metformin currently and continues to work on diet and exercise to decrease risk of diabetes. She denies nausea or hypoglycemia.  Elevated ALT, Minor  Michelle Pratt labs show elevated ALT of 37. No previous labs on file showed increased ALT.   At risk for diabetes Michelle Pratt is at higher than averagerisk for developing diabetes due to her obesity. She currently denies polyuria or polydipsia.  ALLERGIES: Allergies  Allergen Reactions  . Bee Venom Anaphylaxis and Swelling  . Morphine Nausea And Vomiting    MEDICATIONS: Current Outpatient Medications on File Prior to Visit  Medication Sig Dispense Refill  . acetaminophen (TYLENOL) 325 MG tablet Take 650 mg by mouth every 6 (six) hours as needed.    . Calcium Carb-Cholecalciferol (CALCIUM 1000 + D PO) Take 1 tablet by mouth daily.    [name]  EPINEPHrine (EPIPEN) 0.3 mg/0.3 mL IJ SOAJ injection Inject 0.3 mLs (0.3 mg total) into the muscle once as needed (allergic reaction). 1 Device 0  . FLUoxetine (PROZAC) 10 MG tablet TAKE 1 TABLET BY MOUTH DAILY. 90 tablet 1  . levothyroxine (SYNTHROID, LEVOTHROID) 112 MCG tablet TAKE 1 TABLET BY MOUTH ONCE DAILY 90 tablet 1  . Melatonin 3 MG CAPS Take 1 capsule by mouth.    . NON FORMULARY CBD oil 500 mg PRN for sleep     No current facility-administered medications on file prior to visit.     PAST MEDICAL HISTORY: Past Medical History:  Diagnosis Date  . Depression   . Glaucoma   . Joint pain   . Menopause   . Thyroid disease    hypo  . Vitamin D deficiency     PAST SURGICAL HISTORY: Past Surgical History:  Procedure Laterality Date  . oraf     left eyebrow  . ORIF ELBOW FRACTURE    . TOE SURGERY Right    right great toe  . TONSILLECTOMY    . TUBAL LIGATION      SOCIAL HISTORY: Social History   Tobacco Use  . Smoking status: Former Games developer  . Smokeless tobacco: Never Used  . Tobacco comment: over 20 years ago  Substance Use Topics  . Alcohol use: Yes    Alcohol/week: 3.0 standard drinks    Types: 3 Glasses of wine per week  . Drug  use: No    FAMILY HISTORY: Family History  Problem Relation Age of Onset  . Alcohol abuse Mother   . Parkinsonism Mother   . Hypertension Father   . Cancer Father 87       prostate  . Hyperlipidemia Father   . Stroke Brother     ROS: Review of Systems  Constitutional: Positive for weight loss.  Gastrointestinal: Negative for nausea and vomiting.  Musculoskeletal:       Negative for muscle weakness.   Endo/Heme/Allergies: Negative for polydipsia.       Negative for hypoglycemia and polyuria    PHYSICAL EXAM: Blood pressure 132/78, pulse (!) 51, temperature 97.8 F (36.6 C), temperature source Oral, height 5\' 10"  (1.778 m), weight 206 lb (93.4 kg), SpO2 97 %. Body mass index is 29.56 kg/m. Physical Exam    Constitutional: She is oriented to person, place, and time. She appears well-developed and well-nourished.  HENT:  Head: Normocephalic and atraumatic.  Neck: Normal range of motion.  Cardiovascular: Normal rate.  Pulmonary/Chest: Effort normal.  Musculoskeletal: Normal range of motion.  Neurological: She is alert and oriented to person, place, and time.  Skin: Skin is warm and dry.  Psychiatric: She has a normal mood and affect. Her behavior is normal.  Vitals reviewed.   RECENT LABS AND TESTS: BMET    Component Value Date/Time   NA 139 02/02/2018 0947   K 4.5 02/02/2018 0947   CL 101 02/02/2018 0947   CO2 23 02/02/2018 0947   GLUCOSE 93 02/02/2018 0947   GLUCOSE 119 (H) 11/10/2017 0753   BUN 18 02/02/2018 0947   CREATININE 0.77 02/02/2018 0947   CALCIUM 10.2 02/02/2018 0947   GFRNONAA 84 02/02/2018 0947   GFRAA 96 02/02/2018 0947   Lab Results  Component Value Date   HGBA1C 5.9 12/17/2017   Lab Results  Component Value Date   INSULIN 10.1 02/02/2018   CBC    Component Value Date/Time   WBC 5.8 11/10/2017 0753   RBC 4.54 11/10/2017 0753   HGB 13.7 11/10/2017 0753   HCT 40.8 11/10/2017 0753   PLT 299.0 11/10/2017 0753   MCV 89.8 11/10/2017 0753   MCHC 33.5 11/10/2017 0753   RDW 14.0 11/10/2017 0753   LYMPHSABS 1.8 11/10/2017 0753   MONOABS 0.5 11/10/2017 0753   EOSABS 0.2 11/10/2017 0753   BASOSABS 0.0 11/10/2017 0753   Iron/TIBC/Ferritin/ %Sat No results found for: IRON, TIBC, FERRITIN, IRONPCTSAT Lipid Panel     Component Value Date/Time   CHOL 203 (H) 11/10/2017 0753   TRIG 110.0 11/10/2017 0753   HDL 59.90 11/10/2017 0753   CHOLHDL 3 11/10/2017 0753   VLDL 22.0 11/10/2017 0753   LDLCALC 121 (H) 11/10/2017 0753   LDLDIRECT 133.3 03/11/2012 1112   Hepatic Function Panel     Component Value Date/Time   PROT 6.9 02/02/2018 0947   ALBUMIN 5.0 (H) 02/02/2018 0947   AST 25 02/02/2018 0947   ALT 37 (H) 02/02/2018 0947   ALKPHOS 74 02/02/2018 0947    BILITOT 0.4 02/02/2018 0947   BILIDIR 0.1 12/17/2017 0808      Component Value Date/Time   TSH 2.850 02/02/2018 0947   TSH 3.27 11/10/2017 0753   TSH 1.46 11/12/2016 0819     Ref. Range 02/02/2018 09:47  Vitamin D, 25-Hydroxy Latest Ref Range: 30.0 - 100.0 ng/mL 27.3 (L)   ASSESSMENT AND PLAN: Vitamin D deficiency - Plan: Vitamin D, Ergocalciferol, (DRISDOL) 50000 units CAPS capsule  Prediabetes  Elevated ALT  measurement  At risk for diabetes mellitus  Class 1 obesity with serious comorbidity and body mass index (BMI) of 30.0 to 30.9 in adult, unspecified obesity type - Starting BMI greater then 30  PLAN: Vitamin D Deficiency Michelle Pratt was informed that low vitamin D levels contributes to fatigue and are associated with obesity, breast, and colon cancer. She agrees to start to take prescription Vit D @50 ,000 IU every week #4 with no refills and will follow up for routine testing of vitamin D, at least 2-3 times per year. She was informed of the risk of over-replacement of vitamin D and agrees to not increase her dose unless she discusses this with [place] first. Agrees to follow up with our clinic as directed.   Pre-Diabetes Michelle Pratt will continue to work on weight loss, exercise, and decreasing simple carbohydrates in her diet to help decrease the risk of diabetes. We dicussed metformin including benefits and risks. She was informed that eating too many simple carbohydrates or too many calories at one sitting increases the likelihood of GI side effects. Michelle Pratt declined metformin for now and a prescription was not written today. She agrees to decrease carbohydrates and increase protein. Michelle Pratt agreed to follow up with [place] as directed to monitor her progress.  Elevated ALT, Minor Discussed ALT, fatty liver, no intervention at this times will continue to monitor. Should improve with weight loss. Agrees to follow up with our clinic as directed.   Diabetes risk counseling Michelle Pratt was given  extended (15 minutes) diabetes prevention counseling today. She is 62 y.o. female and has risk factors for diabetes including obesity. We discussed intensive lifestyle modifications today with an emphasis on weight loss as well as increasing exercise and decreasing simple carbohydrates in her diet.  Obesity Michelle Pratt is currently in the action stage of change. As such, her goal is to continue with weight loss efforts She has agreed to follow the Category 3 plan  Discussed with her to do more meal planning.  Michelle Pratt has been instructed to work up to a goal of 150 minutes of combined cardio and strengthening exercise per week for weight loss and overall health benefits. We discussed the following Behavioral Modification Strategies today: increasing lean protein intake, decreasing simple carbohydrates , increasing vegetables, decrease eating out, increasing water intake, keeping healthy foods in the home, and work on meal planning and easy cooking plans.    Michelle Pratt has agreed to follow up with our clinic in 2 weeks. She was informed of the importance of frequent follow up visits to maximize her success with intensive lifestyle modifications for her multiple health conditions.   OBESITY BEHAVIORAL INTERVENTION VISIT  Today's visit was # 2   Starting weight: 207 lb Starting date: 02/02/18 Today's weight : 206 lb Today's date: 02/16/18 Total lbs lost to date: 1 lb    ASK: We discussed the diagnosis of obesity with Michelle Pratt today and Khamani agreed to give [place] permission to discuss obesity behavioral modification therapy today.  ASSESS: Michelle Pratt has the diagnosis of obesity and her BMI today is 29.56 Eleina is in the action stage of change   ADVISE: Gracianna was educated on the multiple health risks of obesity as well as the benefit of weight loss to improve her health. She was advised of the need for long term treatment and the importance of lifestyle modifications to improve her current  health and to decrease her risk of future health problems.  AGREE: Multiple dietary modification options and treatment options were discussed  and  Taci agreed to follow the recommendations documented in the above note.  ARRANGE: Carnesha was educated on the importance of frequent visits to treat obesity as outlined per CMS and USPSTF guidelines and agreed to schedule her next follow up appointment today.  Otis Peak, am acting as transcriptionist for [employer], DO  I have reviewed the above documentation for accuracy and completeness, and I agree with the above. -Corinna Capra, DO
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
## 2018-02-23 ENCOUNTER — Ambulatory Visit (INDEPENDENT_AMBULATORY_CARE_PROVIDER_SITE_OTHER): Payer: 59 | Admitting: Psychology

## 2018-02-23 DIAGNOSIS — F331 Major depressive disorder, recurrent, moderate: Secondary | ICD-10-CM

## 2018-02-23 NOTE — Addendum Note (Signed)
Addended by: Cristy Folks on: 02/23/2018 12:16 PM   Modules accepted: Orders

## 2018-03-03 ENCOUNTER — Ambulatory Visit (INDEPENDENT_AMBULATORY_CARE_PROVIDER_SITE_OTHER): Payer: 59 | Admitting: Bariatrics

## 2018-03-03 VITALS — BP 108/71 | HR 68 | Temp 98.3°F | Ht 70.0 in | Wt 201.0 lb

## 2018-03-03 DIAGNOSIS — E559 Vitamin D deficiency, unspecified: Secondary | ICD-10-CM

## 2018-03-03 DIAGNOSIS — Z6830 Body mass index (BMI) 30.0-30.9, adult: Secondary | ICD-10-CM | POA: Diagnosis not present

## 2018-03-03 DIAGNOSIS — R7303 Prediabetes: Secondary | ICD-10-CM | POA: Diagnosis not present

## 2018-03-03 DIAGNOSIS — E669 Obesity, unspecified: Secondary | ICD-10-CM | POA: Diagnosis not present

## 2018-03-03 MED FILL — VIT D2 1.25 MG (50,000 UNIT: 1.25 MG | 28 days supply | Qty: 4 | Fill #0

## 2018-03-09 DIAGNOSIS — E559 Vitamin D deficiency, unspecified: Secondary | ICD-10-CM | POA: Insufficient documentation

## 2018-03-09 DIAGNOSIS — E669 Obesity, unspecified: Secondary | ICD-10-CM | POA: Insufficient documentation

## 2018-03-09 DIAGNOSIS — R7303 Prediabetes: Secondary | ICD-10-CM | POA: Insufficient documentation

## 2018-03-09 DIAGNOSIS — Z6830 Body mass index (BMI) 30.0-30.9, adult: Secondary | ICD-10-CM

## 2018-03-09 HISTORY — DX: Morbid (severe) obesity due to excess calories: E66.01

## 2018-03-09 NOTE — Progress Notes (Signed)
Office: [phone]  /  Fax: [phone]   HPI:   Chief Complaint: OBESITY Michelle Pratt is here to discuss her progress with her obesity treatment plan. She is on the Category 3 plan and is following her eating plan approximately 60 % of the time. She states she is exercising 0 minutes 0 times per week. Michelle Pratt reports that she is "sticking to the plan". She is occasionally hungry at work and she is "getting in most of the food". Michelle Pratt has no significant cravings. Her weight is 201 lb (91.2 kg) today and has had a weight loss of 5 pounds over a period of 2 weeks since her last visit. She has lost 6 lbs since starting treatment with [place].  Vitamin D deficiency Michelle Pratt has a diagnosis of vitamin D deficiency. She did not pick up her prescription for vitamin D at the pharmacy, due to confusion about her prescription. She denies nausea, vomiting or muscle weakness.  Pre-Diabetes Michelle Pratt has a diagnosis of prediabetes based on her elevated Hgb A1c of 5.9 and fasting insulin level of 10.1 12/17/17.Michelle Pratt was informed this puts her at greater risk of developing diabetes. She is not taking metformin currently. She continues to work on diet and exercise to decrease risk of diabetes. She denies nausea or hypoglycemia.  ALLERGIES: Allergies  Allergen Reactions  . Bee Venom Anaphylaxis and Swelling  . Morphine Nausea And Vomiting    MEDICATIONS: Current Outpatient Medications on File Prior to Visit  Medication Sig Dispense Refill  . acetaminophen (TYLENOL) 325 MG tablet Take 650 mg by mouth every 6 (six) hours as needed.    . Calcium Carb-Cholecalciferol (CALCIUM 1000 + D PO) Take 1 tablet by mouth daily.    [name] EPINEPHrine (EPIPEN) 0.3 mg/0.3 mL IJ SOAJ injection Inject 0.3 mLs (0.3 mg total) into the muscle once as needed (allergic reaction). 1 Device 0  . FLUoxetine (PROZAC) 10 MG tablet TAKE 1 TABLET BY MOUTH DAILY. 90 tablet 1  . levothyroxine (SYNTHROID, LEVOTHROID) 112 MCG tablet TAKE 1 TABLET  BY MOUTH ONCE DAILY 90 tablet 1  . Melatonin 3 MG CAPS Take 1 capsule by mouth.    . NON FORMULARY CBD oil 500 mg PRN for sleep    . Vitamin D, Ergocalciferol, (DRISDOL) 50000 units CAPS capsule Take 1 capsule (50,000 Units total) by mouth every 7 (seven) days. 4 capsule 0   No current facility-administered medications on file prior to visit.     PAST MEDICAL HISTORY: Past Medical History:  Diagnosis Date  . Depression   . Glaucoma   . Joint pain   . Menopause   . Thyroid disease    hypo  . Vitamin D deficiency     PAST SURGICAL HISTORY: Past Surgical History:  Procedure Laterality Date  . oraf     left eyebrow  . ORIF ELBOW FRACTURE    . TOE SURGERY Right    right great toe  . TONSILLECTOMY    . TUBAL LIGATION      SOCIAL HISTORY: Social History   Tobacco Use  . Smoking status: Former Games developer  . Smokeless tobacco: Never Used  . Tobacco comment: over 20 years ago  Substance Use Topics  . Alcohol use: Yes    Alcohol/week: 3.0 standard drinks    Types: 3 Glasses of wine per week  . Drug use: No    FAMILY HISTORY: Family History  Problem Relation Age of Onset  . Alcohol abuse Mother   . Parkinsonism Mother   .  Hypertension Father   . Cancer Father 47       prostate  . Hyperlipidemia Father   . Stroke Brother     ROS: Review of Systems  Constitutional: Positive for weight loss.  Gastrointestinal: Negative for nausea and vomiting.  Musculoskeletal:       Negative for muscle weakness  Endo/Heme/Allergies:       Negative for hypoglycemia    PHYSICAL EXAM: Blood pressure 108/71, pulse 68, temperature 98.3 F (36.8 C), temperature source Oral, height 5\' 10"  (1.778 m), weight 201 lb (91.2 kg), SpO2 95 %. Body mass index is 28.84 kg/m. Physical Exam  Constitutional: She is oriented to person, place, and time. She appears well-developed and well-nourished.  Cardiovascular: Normal rate.  Pulmonary/Chest: Effort normal.  Musculoskeletal: Normal range of  motion.  Neurological: She is oriented to person, place, and time.  Skin: Skin is warm and dry.  Psychiatric: She has a normal mood and affect. Her behavior is normal.  Vitals reviewed.   RECENT LABS AND TESTS: BMET    Component Value Date/Time   NA 139 02/02/2018 0947   K 4.5 02/02/2018 0947   CL 101 02/02/2018 0947   CO2 23 02/02/2018 0947   GLUCOSE 93 02/02/2018 0947   GLUCOSE 119 (H) 11/10/2017 0753   BUN 18 02/02/2018 0947   CREATININE 0.77 02/02/2018 0947   CALCIUM 10.2 02/02/2018 0947   GFRNONAA 84 02/02/2018 0947   GFRAA 96 02/02/2018 0947   Lab Results  Component Value Date   HGBA1C 5.9 12/17/2017   Lab Results  Component Value Date   INSULIN 10.1 02/02/2018   CBC    Component Value Date/Time   WBC 5.8 11/10/2017 0753   RBC 4.54 11/10/2017 0753   HGB 13.7 11/10/2017 0753   HCT 40.8 11/10/2017 0753   PLT 299.0 11/10/2017 0753   MCV 89.8 11/10/2017 0753   MCHC 33.5 11/10/2017 0753   RDW 14.0 11/10/2017 0753   LYMPHSABS 1.8 11/10/2017 0753   MONOABS 0.5 11/10/2017 0753   EOSABS 0.2 11/10/2017 0753   BASOSABS 0.0 11/10/2017 0753   Iron/TIBC/Ferritin/ %Sat No results found for: IRON, TIBC, FERRITIN, IRONPCTSAT Lipid Panel     Component Value Date/Time   CHOL 203 (H) 11/10/2017 0753   TRIG 110.0 11/10/2017 0753   HDL 59.90 11/10/2017 0753   CHOLHDL 3 11/10/2017 0753   VLDL 22.0 11/10/2017 0753   LDLCALC 121 (H) 11/10/2017 0753   LDLDIRECT 133.3 03/11/2012 1112   Hepatic Function Panel     Component Value Date/Time   PROT 6.9 02/02/2018 0947   ALBUMIN 5.0 (H) 02/02/2018 0947   AST 25 02/02/2018 0947   ALT 37 (H) 02/02/2018 0947   ALKPHOS 74 02/02/2018 0947   BILITOT 0.4 02/02/2018 0947   BILIDIR 0.1 12/17/2017 0808      Component Value Date/Time   TSH 2.850 02/02/2018 0947   TSH 3.27 11/10/2017 0753   TSH 1.46 11/12/2016 0819   Results for Michelle Pratt, Michelle Pratt (MRN [id number]) as of 03/09/2018 13:54  Ref. Range 02/02/2018 09:47  Vitamin D,  25-Hydroxy Latest Ref Range: 30.0 - 100.0 ng/mL 27.3 (L)   ASSESSMENT AND PLAN: Vitamin D deficiency  Prediabetes  Class 1 obesity with serious comorbidity and body mass index (BMI) of 30.0 to 30.9 in adult, unspecified obesity type - Starting BMI greater then 30  PLAN:  Vitamin D Deficiency Michelle Pratt was informed that low vitamin D levels contributes to fatigue and are associated with obesity, breast, and colon cancer. We  called the pharmacy and Michelle Pratt pick up her prescription at the pharmacy and to take high dose Vit D @50 ,000 IU every week. She will follow up for routine testing of vitamin D, at least 2-3 times per year. She was informed of the risk of over-replacement of vitamin D and agrees to not increase her dose unless she discusses this with [place] first.   Pre-Diabetes Michelle Pratt will continue to work on weight loss and exercise. She will work on increasing lean protein and decreasing simple carbohydrates in her diet to help decrease the risk of diabetes. She was informed that eating too many simple carbohydrates or too many calories at one sitting increases the likelihood of GI side effects. Michelle Pratt agreed to follow up with [place] as directed to monitor her progress.  I spent > than 50% of the 15 minute visit on counseling as documented in the note.  Obesity Michelle Pratt is currently in the action stage of change. As such, her goal is to continue with weight loss efforts She has agreed to follow the Category 3 plan Michelle Pratt has been instructed to work up to a goal of 150 minutes of combined cardio and strengthening exercise per week for weight loss and overall health benefits. We discussed the following Behavioral Modification Strategies today: no skipping meals, increase H2O intake, keeping healthy foods in the home, increasing lean protein intake, decreasing simple carbohydrates , increasing vegetables and decrease eating out  Recipes were given to patient today.  Michelle Pratt has agreed to follow  up with our clinic in 2 weeks. She was informed of the importance of frequent follow up visits to maximize her success with intensive lifestyle modifications for her multiple health conditions.   OBESITY BEHAVIORAL INTERVENTION VISIT  Today's visit was # 3   Starting weight: 207 lbs Starting date: 02/02/18 Today's weight : 201 lbs Today's date: 03/03/2018 Total lbs lost to date: 6   ASK: We discussed the diagnosis of obesity with Michelle Pratt today and Michelle Pratt agreed to give [place] permission to discuss obesity behavioral modification therapy today.  ASSESS: Michelle Pratt has the diagnosis of obesity and her BMI today is 28.84 Michelle Pratt is in the action stage of change   ADVISE: Michelle Pratt was educated on the multiple health risks of obesity as well as the benefit of weight loss to improve her health. She was advised of the need for long term treatment and the importance of lifestyle modifications to improve her current health and to decrease her risk of future health problems.  AGREE: Multiple dietary modification options and treatment options were discussed and  Michelle Pratt agreed to follow the recommendations documented in the above note.  ARRANGE: Leyli was educated on the importance of frequent visits to treat obesity as outlined per CMS and USPSTF guidelines and agreed to schedule her next follow up appointment today.  Cristi Loron, am acting as Energy manager for Michelle Pratt. Manson Passey, DO  I have reviewed the above documentation for accuracy and completeness, and I agree with the above. -Corinna Capra, DO

## 2018-03-12 ENCOUNTER — Ambulatory Visit: Payer: 59 | Admitting: Psychology

## 2018-03-12 DIAGNOSIS — F4321 Adjustment disorder with depressed mood: Secondary | ICD-10-CM | POA: Diagnosis not present

## 2018-03-12 DIAGNOSIS — M25561 Pain in right knee: Secondary | ICD-10-CM | POA: Insufficient documentation

## 2018-03-12 DIAGNOSIS — M25562 Pain in left knee: Secondary | ICD-10-CM

## 2018-03-12 HISTORY — DX: Pain in left knee: M25.562

## 2018-03-12 HISTORY — DX: Pain in right knee: M25.561

## 2018-03-13 ENCOUNTER — Encounter: Payer: Self-pay | Admitting: Family Medicine

## 2018-03-13 ENCOUNTER — Other Ambulatory Visit: Payer: Self-pay | Admitting: Family Medicine

## 2018-03-13 DIAGNOSIS — F321 Major depressive disorder, single episode, moderate: Secondary | ICD-10-CM

## 2018-03-13 MED ORDER — FLUOXETINE HCL 20 MG PO TABS: 20.0000 mg | ORAL_TABLET | Freq: Every day | ORAL | 3 refills | Status: DC

## 2018-03-13 MED FILL — FLUoxetine HCL 20 MG TABS: 20 | 30 days supply | Qty: 30 | Fill #0

## 2018-03-18 ENCOUNTER — Ambulatory Visit (INDEPENDENT_AMBULATORY_CARE_PROVIDER_SITE_OTHER): Payer: Self-pay | Admitting: Psychology

## 2018-03-18 ENCOUNTER — Ambulatory Visit (INDEPENDENT_AMBULATORY_CARE_PROVIDER_SITE_OTHER): Payer: 59 | Admitting: Family Medicine

## 2018-03-18 VITALS — BP 109/70 | HR 63 | Temp 97.7°F | Ht 70.0 in | Wt 197.0 lb

## 2018-03-18 DIAGNOSIS — R7303 Prediabetes: Secondary | ICD-10-CM | POA: Diagnosis not present

## 2018-03-18 DIAGNOSIS — Z6830 Body mass index (BMI) 30.0-30.9, adult: Secondary | ICD-10-CM

## 2018-03-18 DIAGNOSIS — E669 Obesity, unspecified: Secondary | ICD-10-CM | POA: Diagnosis not present

## 2018-03-18 NOTE — Progress Notes (Signed)
Office: [phone]  /  Fax: [phone]   HPI:   Chief Complaint: OBESITY Michelle Pratt is here to discuss her progress with her obesity treatment plan. She is on the Category 3 plan and is following her eating plan approximately 85 % of the time. She states she is exercising 0 minutes 0 times per week. Crucita is doing well with weight loss. She had an episode of likely food poisoning yesterday, but is able to keep down water today. Otherwise, she has done well with her Category 3 plan.  Her weight is 197 lb (89.4 kg) today and has had a weight loss of 4 pounds over a period of 2 weeks since her last visit. She has lost 10 lbs since starting treatment with [place].  Pre-Diabetes Michelle Pratt has a diagnosis of pre-diabetes based on her elevated Hgb A1c and was informed this puts her at greater risk of developing diabetes. She is not taking metformin currently, and she is working on diet and exercise to decrease risk of diabetes. She states her hunger is controlled and is trying to increase lean protein. She denies hypoglycemia.  ALLERGIES: Allergies  Allergen Reactions  . Bee Venom Anaphylaxis and Swelling  . Morphine Nausea And Vomiting    MEDICATIONS: Current Outpatient Medications on File Prior to Visit  Medication Sig Dispense Refill  . acetaminophen (TYLENOL) 325 MG tablet Take 650 mg by mouth every 6 (six) hours as needed.    . Calcium Carb-Cholecalciferol (CALCIUM 1000 + D PO) Take 1 tablet by mouth daily.    [name] EPINEPHrine (EPIPEN) 0.3 mg/0.3 mL IJ SOAJ injection Inject 0.3 mLs (0.3 mg total) into the muscle once as needed (allergic reaction). 1 Device 0  . FLUoxetine (PROZAC) 20 MG tablet Take 1 tablet (20 mg total) by mouth daily. 30 tablet 3  . levothyroxine (SYNTHROID, LEVOTHROID) 112 MCG tablet TAKE 1 TABLET BY MOUTH ONCE DAILY 90 tablet 1  . Melatonin 3 MG CAPS Take 1 capsule by mouth.    . NON FORMULARY CBD oil 500 mg PRN for sleep    . Vitamin D, Ergocalciferol, (DRISDOL) 50000  units CAPS capsule Take 1 capsule (50,000 Units total) by mouth every 7 (seven) days. 4 capsule 0   No current facility-administered medications on file prior to visit.     PAST MEDICAL HISTORY: Past Medical History:  Diagnosis Date  . Depression   . Glaucoma   . Joint pain   . Menopause   . Thyroid disease    hypo  . Vitamin D deficiency     PAST SURGICAL HISTORY: Past Surgical History:  Procedure Laterality Date  . oraf     left eyebrow  . ORIF ELBOW FRACTURE    . TOE SURGERY Right    right great toe  . TONSILLECTOMY    . TUBAL LIGATION      SOCIAL HISTORY: Social History   Tobacco Use  . Smoking status: Former Games developer  . Smokeless tobacco: Never Used  . Tobacco comment: over 20 years ago  Substance Use Topics  . Alcohol use: Yes    Alcohol/week: 3.0 standard drinks    Types: 3 Glasses of wine per week  . Drug use: No    FAMILY HISTORY: Family History  Problem Relation Age of Onset  . Alcohol abuse Mother   . Parkinsonism Mother   . Hypertension Father   . Cancer Father 49       prostate  . Hyperlipidemia Father   . Stroke Brother  ROS: Review of Systems  Constitutional: Positive for weight loss.  Endo/Heme/Allergies:       Negative hypoglycemia    PHYSICAL EXAM: Blood pressure 109/70, pulse 63, temperature 97.7 F (36.5 C), temperature source Oral, height 5\' 10"  (1.778 m), weight 197 lb (89.4 kg), SpO2 95 %. Body mass index is 28.27 kg/m. Physical Exam  Constitutional: She is oriented to person, place, and time. She appears well-developed and well-nourished.  Cardiovascular: Normal rate.  Pulmonary/Chest: Effort normal.  Musculoskeletal: Normal range of motion.  Neurological: She is oriented to person, place, and time.  Skin: Skin is warm and dry.  Psychiatric: She has a normal mood and affect. Her behavior is normal.  Vitals reviewed.   RECENT LABS AND TESTS: BMET    Component Value Date/Time   NA 139 02/02/2018 0947   K 4.5  02/02/2018 0947   CL 101 02/02/2018 0947   CO2 23 02/02/2018 0947   GLUCOSE 93 02/02/2018 0947   GLUCOSE 119 (H) 11/10/2017 0753   BUN 18 02/02/2018 0947   CREATININE 0.77 02/02/2018 0947   CALCIUM 10.2 02/02/2018 0947   GFRNONAA 84 02/02/2018 0947   GFRAA 96 02/02/2018 0947   Lab Results  Component Value Date   HGBA1C 5.9 12/17/2017   Lab Results  Component Value Date   INSULIN 10.1 02/02/2018   CBC    Component Value Date/Time   WBC 5.8 11/10/2017 0753   RBC 4.54 11/10/2017 0753   HGB 13.7 11/10/2017 0753   HCT 40.8 11/10/2017 0753   PLT 299.0 11/10/2017 0753   MCV 89.8 11/10/2017 0753   MCHC 33.5 11/10/2017 0753   RDW 14.0 11/10/2017 0753   LYMPHSABS 1.8 11/10/2017 0753   MONOABS 0.5 11/10/2017 0753   EOSABS 0.2 11/10/2017 0753   BASOSABS 0.0 11/10/2017 0753   Iron/TIBC/Ferritin/ %Sat No results found for: IRON, TIBC, FERRITIN, IRONPCTSAT Lipid Panel     Component Value Date/Time   CHOL 203 (H) 11/10/2017 0753   TRIG 110.0 11/10/2017 0753   HDL 59.90 11/10/2017 0753   CHOLHDL 3 11/10/2017 0753   VLDL 22.0 11/10/2017 0753   LDLCALC 121 (H) 11/10/2017 0753   LDLDIRECT 133.3 03/11/2012 1112   Hepatic Function Panel     Component Value Date/Time   PROT 6.9 02/02/2018 0947   ALBUMIN 5.0 (H) 02/02/2018 0947   AST 25 02/02/2018 0947   ALT 37 (H) 02/02/2018 0947   ALKPHOS 74 02/02/2018 0947   BILITOT 0.4 02/02/2018 0947   BILIDIR 0.1 12/17/2017 0808      Component Value Date/Time   TSH 2.850 02/02/2018 0947   TSH 3.27 11/10/2017 0753   TSH 1.46 11/12/2016 0819    ASSESSMENT AND PLAN: Prediabetes  Class 1 obesity with serious comorbidity and body mass index (BMI) of 30.0 to 30.9 in adult, unspecified obesity type - Starting BMI greater then 30  PLAN:  Pre-Diabetes Aprill will continue to work on weight loss, diet, exercise, and decreasing simple carbohydrates in her diet to help decrease the risk of diabetes. We dicussed metformin including  benefits and risks. She was informed that eating too many simple carbohydrates or too many calories at one sitting increases the likelihood of GI side effects. Neaveh declined metformin for now and a prescription was not written today. We will recheck labs in 6 weeks. Alleah agrees to follow up with our clinic in 3 to 4 weeks as directed to monitor her progress.  I spent > than 50% of the 15 minute visit on counseling  as documented in the note.  Obesity Michelle Pratt is currently in the action stage of change. As such, her goal is to continue with weight loss efforts She has agreed to follow the Category 3 plan Michelle Pratt has been instructed to work up to a goal of 150 minutes of combined cardio and strengthening exercise per week for weight loss and overall health benefits. We discussed the following Behavioral Modification Strategies today: increasing lean protein intake, increase H20 intake, and decreasing simple carbohydrates    Michelle Pratt has agreed to follow up with our clinic in 3 to 4 weeks. She was informed of the importance of frequent follow up visits to maximize her success with intensive lifestyle modifications for her multiple health conditions.   OBESITY BEHAVIORAL INTERVENTION VISIT  Today's visit was # 4   Starting weight: 207 lbs Starting date: 02/02/18 Today's weight : 197 lbs  Today's date: 03/18/2018 Total lbs lost to date: 10    ASK: We discussed the diagnosis of obesity with Michelle Pratt today and Michelle Pratt agreed to give [place] permission to discuss obesity behavioral modification therapy today.  ASSESS: Michelle Pratt has the diagnosis of obesity and her BMI today is 28.27 Arian is in the action stage of change   ADVISE: Michelle Pratt was educated on the multiple health risks of obesity as well as the benefit of weight loss to improve her health. She was advised of the need for long term treatment and the importance of lifestyle modifications to improve her current health and to  decrease her risk of future health problems.  AGREE: Multiple dietary modification options and treatment options were discussed and  Michelle Pratt agreed to follow the recommendations documented in the above note.  ARRANGE: Michelle Pratt was educated on the importance of frequent visits to treat obesity as outlined per CMS and USPSTF guidelines and agreed to schedule her next follow up appointment today.  I, Burt Knack, am acting as transcriptionist for Quillian Quince, MD  I have reviewed the above documentation for accuracy and completeness, and I agree with the above. -Quillian Quince, MD

## 2018-03-19 ENCOUNTER — Encounter (INDEPENDENT_AMBULATORY_CARE_PROVIDER_SITE_OTHER): Payer: Self-pay | Admitting: Family Medicine

## 2018-03-23 ENCOUNTER — Ambulatory Visit: Payer: 59 | Admitting: Psychology

## 2018-03-23 DIAGNOSIS — F4321 Adjustment disorder with depressed mood: Secondary | ICD-10-CM

## 2018-04-14 ENCOUNTER — Ambulatory Visit (INDEPENDENT_AMBULATORY_CARE_PROVIDER_SITE_OTHER): Payer: 59 | Admitting: Bariatrics

## 2018-04-14 ENCOUNTER — Encounter (INDEPENDENT_AMBULATORY_CARE_PROVIDER_SITE_OTHER): Payer: Self-pay | Admitting: Bariatrics

## 2018-04-14 VITALS — BP 105/66 | HR 56 | Temp 97.6°F | Ht 70.0 in | Wt 196.0 lb

## 2018-04-14 DIAGNOSIS — R7303 Prediabetes: Secondary | ICD-10-CM | POA: Diagnosis not present

## 2018-04-14 DIAGNOSIS — E669 Obesity, unspecified: Secondary | ICD-10-CM

## 2018-04-14 DIAGNOSIS — Z6830 Body mass index (BMI) 30.0-30.9, adult: Secondary | ICD-10-CM

## 2018-04-14 DIAGNOSIS — E559 Vitamin D deficiency, unspecified: Secondary | ICD-10-CM

## 2018-04-14 DIAGNOSIS — Z9189 Other specified personal risk factors, not elsewhere classified: Secondary | ICD-10-CM | POA: Diagnosis not present

## 2018-04-15 ENCOUNTER — Ambulatory Visit: Payer: 59 | Admitting: Psychology

## 2018-04-15 DIAGNOSIS — F4321 Adjustment disorder with depressed mood: Secondary | ICD-10-CM

## 2018-04-17 MED ORDER — VITAMIN D (ERGOCALCIFEROL) 1.25 MG (50000 UNIT) PO CAPS: 50000.0000 [IU] | ORAL_CAPSULE | ORAL | 0 refills | Status: DC

## 2018-04-17 MED FILL — VIT D2 1.25 MG (50,000 UNIT: 1.25 MG | 28 days supply | Qty: 4 | Fill #0

## 2018-04-17 NOTE — Progress Notes (Signed)
Office: [phone]  /  Fax: [phone]   HPI:   Chief Complaint: OBESITY Michelle Pratt is here to discuss her progress with her obesity treatment plan. She is on the  follow the Category 3 plan and is following her eating plan approximately 80 % of the time. She states she is exercising 0 minutes 0 times per week. Michelle Pratt did not struggle due to the holiday. She is getting an adequate, and hunger.  Her weight is 196 lb (88.9 kg) today and has had a weight loss of 1 pound over a period of 4 weeks since her last visit. She has lost 11 lbs since starting treatment with [place].  Vitamin D deficiency Michelle Pratt has a diagnosis of vitamin D deficiency. She is currently taking vit D and denies nausea, vomiting or muscle weakness.  Ref. Range 02/02/2018 09:47  Vitamin D, 25-Hydroxy Latest Ref Range: 30.0 - 100.0 ng/mL 27.3 (L)   Pre-Diabetes Michelle Pratt has a diagnosis of prediabetes based on her elevated HgA1c of 5.9 and Insulin level of 10.1 on 12/17/17 and was informed this puts her at greater risk of developing diabetes. She is not taking metformin currently and continues to work on diet and exercise to decrease risk of diabetes. She denies nausea or hypoglycemia.  At risk for osteopenia and osteoporosis Michelle Pratt is at higher risk of osteopenia and osteoporosis due to vitamin D deficiency.   ALLERGIES: Allergies  Allergen Reactions  . Bee Venom Anaphylaxis and Swelling  . Morphine Nausea And Vomiting    MEDICATIONS: Current Outpatient Medications on File Prior to Visit  Medication Sig Dispense Refill  . acetaminophen (TYLENOL) 325 MG tablet Take 650 mg by mouth every 6 (six) hours as needed.    . Calcium Carb-Cholecalciferol (CALCIUM 1000 + D PO) Take 1 tablet by mouth daily.    [name] EPINEPHrine (EPIPEN) 0.3 mg/0.3 mL IJ SOAJ injection Inject 0.3 mLs (0.3 mg total) into the muscle once as needed (allergic reaction). 1 Device 0  . FLUoxetine (PROZAC) 20 MG tablet Take 1 tablet (20 mg total) by mouth  daily. 30 tablet 3  . levothyroxine (SYNTHROID, LEVOTHROID) 112 MCG tablet TAKE 1 TABLET BY MOUTH ONCE DAILY 90 tablet 1  . Melatonin 3 MG CAPS Take 1 capsule by mouth.    . NON FORMULARY CBD oil 500 mg PRN for sleep     No current facility-administered medications on file prior to visit.     PAST MEDICAL HISTORY: Past Medical History:  Diagnosis Date  . Depression   . Glaucoma   . Joint pain   . Menopause   . Thyroid disease    hypo  . Vitamin D deficiency     PAST SURGICAL HISTORY: Past Surgical History:  Procedure Laterality Date  . oraf     left eyebrow  . ORIF ELBOW FRACTURE    . TOE SURGERY Right    right great toe  . TONSILLECTOMY    . TUBAL LIGATION      SOCIAL HISTORY: Social History   Tobacco Use  . Smoking status: Former Games developer  . Smokeless tobacco: Never Used  . Tobacco comment: over 20 years ago  Substance Use Topics  . Alcohol use: Yes    Alcohol/week: 3.0 standard drinks    Types: 3 Glasses of wine per week  . Drug use: No    FAMILY HISTORY: Family History  Problem Relation Age of Onset  . Alcohol abuse Mother   . Parkinsonism Mother   . Hypertension Father   .  Cancer Father 38       prostate  . Hyperlipidemia Father   . Stroke Brother     ROS: Review of Systems  Constitutional: Positive for weight loss.  Gastrointestinal: Negative for nausea and vomiting.  Musculoskeletal:       Negative for muscle weakness  Endo/Heme/Allergies:       Negative for hypoglycemia    PHYSICAL EXAM: Blood pressure 105/66, pulse (!) 56, temperature 97.6 F (36.4 C), temperature source Oral, height 5\' 10"  (1.778 m), weight 196 lb (88.9 kg), SpO2 95 %. Body mass index is 28.12 kg/m. Physical Exam  Constitutional: She is oriented to person, place, and time. She appears well-developed and well-nourished.  HENT:  Head: Normocephalic.  Eyes: Pupils are equal, round, and reactive to light.  Neck: Normal range of motion.  Cardiovascular: Normal rate.    Pulmonary/Chest: Effort normal.  Musculoskeletal: Normal range of motion.  Neurological: She is alert and oriented to person, place, and time.  Skin: Skin is warm and dry.  Psychiatric: She has a normal mood and affect. Her behavior is normal.  Vitals reviewed.   RECENT LABS AND TESTS: BMET    Component Value Date/Time   NA 139 02/02/2018 0947   K 4.5 02/02/2018 0947   CL 101 02/02/2018 0947   CO2 23 02/02/2018 0947   GLUCOSE 93 02/02/2018 0947   GLUCOSE 119 (H) 11/10/2017 0753   BUN 18 02/02/2018 0947   CREATININE 0.77 02/02/2018 0947   CALCIUM 10.2 02/02/2018 0947   GFRNONAA 84 02/02/2018 0947   GFRAA 96 02/02/2018 0947   Lab Results  Component Value Date   HGBA1C 5.9 12/17/2017   Lab Results  Component Value Date   INSULIN 10.1 02/02/2018   CBC    Component Value Date/Time   WBC 5.8 11/10/2017 0753   RBC 4.54 11/10/2017 0753   HGB 13.7 11/10/2017 0753   HCT 40.8 11/10/2017 0753   PLT 299.0 11/10/2017 0753   MCV 89.8 11/10/2017 0753   MCHC 33.5 11/10/2017 0753   RDW 14.0 11/10/2017 0753   LYMPHSABS 1.8 11/10/2017 0753   MONOABS 0.5 11/10/2017 0753   EOSABS 0.2 11/10/2017 0753   BASOSABS 0.0 11/10/2017 0753   Iron/TIBC/Ferritin/ %Sat No results found for: IRON, TIBC, FERRITIN, IRONPCTSAT Lipid Panel     Component Value Date/Time   CHOL 203 (H) 11/10/2017 0753   TRIG 110.0 11/10/2017 0753   HDL 59.90 11/10/2017 0753   CHOLHDL 3 11/10/2017 0753   VLDL 22.0 11/10/2017 0753   LDLCALC 121 (H) 11/10/2017 0753   LDLDIRECT 133.3 03/11/2012 1112   Hepatic Function Panel     Component Value Date/Time   PROT 6.9 02/02/2018 0947   ALBUMIN 5.0 (H) 02/02/2018 0947   AST 25 02/02/2018 0947   ALT 37 (H) 02/02/2018 0947   ALKPHOS 74 02/02/2018 0947   BILITOT 0.4 02/02/2018 0947   BILIDIR 0.1 12/17/2017 0808      Component Value Date/Time   TSH 2.850 02/02/2018 0947   TSH 3.27 11/10/2017 0753   TSH 1.46 11/12/2016 0819    ASSESSMENT AND PLAN: Vitamin D  deficiency - Plan: Vitamin D, Ergocalciferol, (DRISDOL) 1.25 MG (50000 UT) CAPS capsule  Prediabetes  At risk for osteoporosis  Class 1 obesity with serious comorbidity and body mass index (BMI) of 30.0 to 30.9 in adult, unspecified obesity type - BMI greater than 30 at start of program  PLAN: Vitamin D Deficiency Michelle Pratt was informed that low vitamin D levels contributes to fatigue and  are associated with obesity, breast, and colon cancer. She agrees to continue to take prescription Vit D @50 ,000 IU every week #4 with no refills and will follow up for routine testing of vitamin D, at least 2-3 times per year. She was informed of the risk of over-replacement of vitamin D and agrees to not increase her dose unless she discusses this with Michelle Pratt first. Agrees to follow up with our clinic as directed.   Pre-Diabetes Michelle Pratt will continue to work on weight loss, exercise, and decreasing simple carbohydrates in her diet to help decrease the risk of diabetes. We dicussed metformin including benefits and risks. She was informed that eating too many simple carbohydrates or too many calories at one sitting increases the likelihood of GI side effects. Michelle Pratt will continue to decrease carbs and increase protein intake. Michelle Pratt agreed to follow up with Michelle Pratt as directed to monitor her progress.  At risk for osteopenia and osteoporosis Michelle Pratt was given extended  (15 minutes) osteoporosis prevention counseling today. Michelle Pratt is at risk for osteopenia and osteoporosis due to her vitamin D deficiency. She was encouraged to take her vitamin D and follow her higher calcium diet and increase strengthening exercise to help strengthen her bones and decrease her risk of osteopenia and osteoporosis.  Obesity Michelle Pratt is currently in the action stage of change. As such, her goal is to continue with weight loss efforts She has agreed to follow the Category 3 plan  We discussed to continue meal planning and increase water  intake.  Michelle Pratt has been instructed to work up to a goal of 150 minutes of combined cardio and strengthening exercise per week for weight loss and overall health benefits. We discussed the following Behavioral Modification Strategies today: increasing lean protein intake, no skipping meals, increasing water intake, decreasing simple carbohydrates , increasing vegetables and work on meal planning and easy cooking plans.    Michelle Pratt has agreed to follow up with our clinic in 2 weeks. She was informed of the importance of frequent follow up visits to maximize her success with intensive lifestyle modifications for her multiple health conditions.   OBESITY BEHAVIORAL INTERVENTION VISIT  Today's visit was # 5   Starting weight: 207 lb Starting date: 02/02/18 Today's weight : Weight: 196 lb (88.9 kg)  Today's date: 04/14/18 Total lbs lost to date: 11 lb    ASK: We discussed the diagnosis of obesity with Michelle Pratt today and Michelle Pratt agreed to give Michelle Pratt permission to discuss obesity behavioral modification therapy today.  ASSESS: Michelle Pratt has the diagnosis of obesity and her BMI today is 28.12 Michelle Pratt is in the action stage of change   ADVISE: Michelle Pratt was educated on the multiple health risks of obesity as well as the benefit of weight loss to improve her health. She was advised of the need for long term treatment and the importance of lifestyle modifications to improve her current health and to decrease her risk of future health problems.  AGREE: Multiple dietary modification options and treatment options were discussed and  Michelle Pratt agreed to follow the recommendations documented in the above note.  ARRANGE: Michelle Pratt was educated on the importance of frequent visits to treat obesity as outlined per CMS and USPSTF guidelines and agreed to schedule her next follow up appointment today.  Otis PeakI, Ashleigh Haynes, am acting as transcriptionist for [employer] Brown, DO   I have reviewed the above  documentation for accuracy and completeness, and I agree with the above. -Corinna CapraAngel Brown, DO

## 2018-04-20 ENCOUNTER — Encounter (INDEPENDENT_AMBULATORY_CARE_PROVIDER_SITE_OTHER): Payer: Self-pay | Admitting: Bariatrics

## 2018-04-20 MED FILL — FLUoxetine HCL 20 MG TABS: 20 | 30 days supply | Qty: 30 | Fill #1

## 2018-05-04 ENCOUNTER — Ambulatory Visit (INDEPENDENT_AMBULATORY_CARE_PROVIDER_SITE_OTHER): Payer: Self-pay | Admitting: Family Medicine

## 2018-05-04 ENCOUNTER — Encounter (INDEPENDENT_AMBULATORY_CARE_PROVIDER_SITE_OTHER): Payer: Self-pay

## 2018-05-20 ENCOUNTER — Ambulatory Visit: Payer: 59 | Admitting: Psychology

## 2018-05-20 DIAGNOSIS — F4321 Adjustment disorder with depressed mood: Secondary | ICD-10-CM

## 2018-05-21 ENCOUNTER — Other Ambulatory Visit: Payer: Self-pay | Admitting: Family Medicine

## 2018-05-21 MED FILL — FLUoxetine HCL 20 MG TABS: 20 | 30 days supply | Qty: 30 | Fill #2

## 2018-05-22 MED FILL — LEVOTHYROXINE 112 MCG TAB: 112 | 90 days supply | Qty: 90 | Fill #0

## 2018-06-24 MED FILL — FLUoxetine HCL 20 MG TABS: 20 | 30 days supply | Qty: 30 | Fill #3

## 2018-07-01 ENCOUNTER — Ambulatory Visit: Payer: 59 | Admitting: Psychology

## 2018-07-01 DIAGNOSIS — F4321 Adjustment disorder with depressed mood: Secondary | ICD-10-CM

## 2018-07-09 ENCOUNTER — Encounter (INDEPENDENT_AMBULATORY_CARE_PROVIDER_SITE_OTHER): Payer: Self-pay | Admitting: Bariatrics

## 2018-07-09 ENCOUNTER — Ambulatory Visit (INDEPENDENT_AMBULATORY_CARE_PROVIDER_SITE_OTHER): Payer: 59 | Admitting: Bariatrics

## 2018-07-09 VITALS — BP 125/65 | HR 58 | Temp 97.7°F | Ht 70.0 in | Wt 193.0 lb

## 2018-07-09 DIAGNOSIS — E669 Obesity, unspecified: Secondary | ICD-10-CM | POA: Diagnosis not present

## 2018-07-09 DIAGNOSIS — R7303 Prediabetes: Secondary | ICD-10-CM | POA: Diagnosis not present

## 2018-07-09 DIAGNOSIS — E559 Vitamin D deficiency, unspecified: Secondary | ICD-10-CM

## 2018-07-09 DIAGNOSIS — Z6830 Body mass index (BMI) 30.0-30.9, adult: Secondary | ICD-10-CM

## 2018-07-09 DIAGNOSIS — E66811 Obesity, class 1: Secondary | ICD-10-CM

## 2018-07-09 DIAGNOSIS — Z9189 Other specified personal risk factors, not elsewhere classified: Secondary | ICD-10-CM

## 2018-07-09 MED ORDER — VITAMIN D (ERGOCALCIFEROL) 1.25 MG (50000 UNIT) PO CAPS: 50000.0000 [IU] | ORAL_CAPSULE | ORAL | 0 refills | Status: DC

## 2018-07-09 NOTE — Progress Notes (Signed)
Office: [phone]  /  Fax: [phone]   HPI:   Chief Complaint: OBESITY Michelle Pratt is here to discuss her progress with her obesity treatment plan. She is on the Category 3 plan and is following her eating plan approximately 30 % of the time. She states she is exercising 0 minutes 0 times per week. Michelle Pratt was last seen on 04/14/18. She has lost three pounds since this time. Her weight is 193 lb (87.5 kg) today and has had a weight loss of 3 pounds over a period of 13 weeks since her last visit. She has lost 14 lbs since starting treatment with [place].  Pre-Diabetes Michelle Pratt has a diagnosis of prediabetes based on her elevated Hgb A1c and was informed this puts her at greater risk of developing diabetes. Her last A1c was at 5.9 and last insulin level was at 10.1 She is not taking medications currently and continues to work on diet and exercise to decrease risk of diabetes. She denies polyphagia.  Vitamin D deficiency Michelle Pratt has a diagnosis of vitamin D deficiency. His last vitamin D level was at 27.3 She is currently taking vit D and denies nausea, vomiting or muscle weakness.  At risk for osteopenia and osteoporosis Michelle Pratt is at higher risk of osteopenia and osteoporosis due to vitamin D deficiency.   ASSESSMENT AND PLAN:  Prediabetes  Vitamin D deficiency - Plan: Vitamin D, Ergocalciferol, (DRISDOL) 1.25 MG (50000 UT) CAPS capsule  At risk for osteoporosis  Class 1 obesity with serious comorbidity and body mass index (BMI) of 30.0 to 30.9 in adult, unspecified obesity type - Starting BMI greater then 30  PLAN:  Pre-Diabetes Michelle Pratt will continue to work on weight loss, exercise, increasing lean protein and decreasing simple carbohydrates in her diet to help decrease the risk of diabetes. She was informed that eating too many simple carbohydrates or too many calories at one sitting increases the likelihood of GI side effects.  Michelle Pratt agreed to follow up with [place] as directed to  monitor her progress.  Vitamin D Deficiency Michelle Pratt was informed that low vitamin D levels contributes to fatigue and are associated with obesity, breast, and colon cancer. She agrees to continue to take prescription Vit D @50 ,000 IU every week #4 with no refills and will follow up for routine testing of vitamin D, at least 2-3 times per year. She was informed of the risk of over-replacement of vitamin D and agrees to not increase her dose unless she discusses this with [place] first. Michelle Pratt agrees to follow up as directed.  At risk for osteopenia and osteoporosis Michelle Pratt was given extended  (15 minutes) osteoporosis prevention counseling today. Michelle Pratt is at risk for osteopenia and osteoporosis due to her vitamin D deficiency. She was encouraged to take her vitamin D and follow her higher calcium diet and increase strengthening exercise to help strengthen her bones and decrease her risk of osteopenia and osteoporosis.  Obesity Michelle Pratt is currently in the action stage of change. As such, her goal is to continue with weight loss efforts She has agreed to follow the Category 3 plan Michelle Pratt has been instructed to work up to a goal of 150 minutes of combined cardio and strengthening exercise per week for weight loss and overall health benefits. We discussed the following Behavioral Modification Strategies today: increase H2O intake, no skipping meals, increasing lean protein intake, decreasing simple carbohydrates, increasing vegetables, limit acorn squash (starchy vegetables) decrease eating out, work on meal planning and easy cooking plans and decrease liquid  calories Additional options for breakfast, lunch and dinner were given to patient today.  Michelle Pratt has agreed to follow up with our clinic in 2 weeks fasting. She was informed of the importance of frequent follow up visits to maximize her success with intensive lifestyle modifications for her multiple health conditions.  ALLERGIES: Allergies    Allergen Reactions  . Bee Venom Anaphylaxis and Swelling  . Morphine Nausea And Vomiting    MEDICATIONS: Current Outpatient Medications on File Prior to Visit  Medication Sig Dispense Refill  . acetaminophen (TYLENOL) 325 MG tablet Take 650 mg by mouth every 6 (six) hours as needed.    . Calcium Carb-Cholecalciferol (CALCIUM 1000 + D PO) Take 1 tablet by mouth daily.    [name] EPINEPHrine (EPIPEN) 0.3 mg/0.3 mL IJ SOAJ injection Inject 0.3 mLs (0.3 mg total) into the muscle once as needed (allergic reaction). 1 Device 0  . FLUoxetine (PROZAC) 20 MG tablet Take 1 tablet (20 mg total) by mouth daily. 30 tablet 3  . levothyroxine (SYNTHROID, LEVOTHROID) 112 MCG tablet TAKE 1 TABLET BY MOUTH ONCE DAILY 90 tablet 1  . Melatonin 3 MG CAPS Take 1 capsule by mouth.    . NON FORMULARY CBD oil 500 mg PRN for sleep     No current facility-administered medications on file prior to visit.     PAST MEDICAL HISTORY: Past Medical History:  Diagnosis Date  . Depression   . Glaucoma   . Joint pain   . Menopause   . Thyroid disease    hypo  . Vitamin D deficiency     PAST SURGICAL HISTORY: Past Surgical History:  Procedure Laterality Date  . oraf     left eyebrow  . ORIF ELBOW FRACTURE    . TOE SURGERY Right    right great toe  . TONSILLECTOMY    . TUBAL LIGATION      SOCIAL HISTORY: Social History   Tobacco Use  . Smoking status: Former Games developer  . Smokeless tobacco: Never Used  . Tobacco comment: over 20 years ago  Substance Use Topics  . Alcohol use: Yes    Alcohol/week: 3.0 standard drinks    Types: 3 Glasses of wine per week  . Drug use: No    FAMILY HISTORY: Family History  Problem Relation Age of Onset  . Alcohol abuse Mother   . Parkinsonism Mother   . Hypertension Father   . Cancer Father 72       prostate  . Hyperlipidemia Father   . Stroke Brother     ROS: Review of Systems  Constitutional: Positive for weight loss.  Gastrointestinal: Negative for nausea and  vomiting.  Musculoskeletal:       Negative for muscle weakness  Endo/Heme/Allergies:       Negative for polyphagia    PHYSICAL EXAM: Blood pressure 125/65, pulse (!) 58, temperature 97.7 F (36.5 C), temperature source Oral, height  (1.778 m), weight 193 lb (87.5 kg), SpO2 96 %. Body mass index is 27.69 kg/m. Physical Exam Vitals signs reviewed.  Constitutional:      Appearance: Normal appearance. She is well-developed. She is obese.  Cardiovascular:     Rate and Rhythm: Normal rate.  Pulmonary:     Effort: Pulmonary effort is normal.  Musculoskeletal: Normal range of motion.  Skin:    General: Skin is warm and dry.  Neurological:     Mental Status: She is alert and oriented to person, place, and time.  Psychiatric:  Mood and Affect: Mood normal.        Behavior: Behavior normal.     RECENT LABS AND TESTS: BMET    Component Value Date/Time   NA 139 02/02/2018 0947   K 4.5 02/02/2018 0947   CL 101 02/02/2018 0947   CO2 23 02/02/2018 0947   GLUCOSE 93 02/02/2018 0947   GLUCOSE 119 (H) 11/10/2017 0753   BUN 18 02/02/2018 0947   CREATININE 0.77 02/02/2018 0947   CALCIUM 10.2 02/02/2018 0947   GFRNONAA 84 02/02/2018 0947   GFRAA 96 02/02/2018 0947   Lab Results  Component Value Date   HGBA1C 5.9 12/17/2017   Lab Results  Component Value Date   INSULIN 10.1 02/02/2018   CBC    Component Value Date/Time   WBC 5.8 11/10/2017 0753   RBC 4.54 11/10/2017 0753   HGB 13.7 11/10/2017 0753   HCT 40.8 11/10/2017 0753   PLT 299.0 11/10/2017 0753   MCV 89.8 11/10/2017 0753   MCHC 33.5 11/10/2017 0753   RDW 14.0 11/10/2017 0753   LYMPHSABS 1.8 11/10/2017 0753   MONOABS 0.5 11/10/2017 0753   EOSABS 0.2 11/10/2017 0753   BASOSABS 0.0 11/10/2017 0753   Iron/TIBC/Ferritin/ %Sat No results found for: IRON, TIBC, FERRITIN, IRONPCTSAT Lipid Panel     Component Value Date/Time   CHOL 203 (H) 11/10/2017 0753   TRIG 110.0 11/10/2017 0753   HDL 59.90  11/10/2017 0753   CHOLHDL 3 11/10/2017 0753   VLDL 22.0 11/10/2017 0753   LDLCALC 121 (H) 11/10/2017 0753   LDLDIRECT 133.3 03/11/2012 1112   Hepatic Function Panel     Component Value Date/Time   PROT 6.9 02/02/2018 0947   ALBUMIN 5.0 (H) 02/02/2018 0947   AST 25 02/02/2018 0947   ALT 37 (H) 02/02/2018 0947   ALKPHOS 74 02/02/2018 0947   BILITOT 0.4 02/02/2018 0947   BILIDIR 0.1 12/17/2017 0808      Component Value Date/Time   TSH 2.850 02/02/2018 0947   TSH 3.27 11/10/2017 0753   TSH 1.46 11/12/2016 0819   Results for MACKENNA, KAMER (MRN [id number]) as of 07/09/2018 15:42  Ref. Range 02/02/2018 09:47  Vitamin D, 25-Hydroxy Latest Ref Range: 30.0 - 100.0 ng/mL 27.3 (L)     OBESITY BEHAVIORAL INTERVENTION VISIT  Today's visit was # 6   Starting weight: 207 lbs Starting date: 02/02/2018 Today's weight : 193 lbs Today's date: 07/09/2018 Total lbs lost to date: 14    07/09/2018  Height  (1.778 m)  Weight 193 lb (87.5 kg)  BMI (Calculated) 27.69  BLOOD PRESSURE - SYSTOLIC 125  BLOOD PRESSURE - DIASTOLIC 65   Body Fat % 39.3 %  Total Body Water (lbs) 73 lbs    ASK: We discussed the diagnosis of obesity with Michelle Pratt today and Michelle Pratt agreed to give [place] permission to discuss obesity behavioral modification therapy today.  ASSESS: Michelle Pratt has the diagnosis of obesity and her BMI today is 27.69 Michelle Pratt is in the action stage of change   ADVISE: Michelle Pratt was educated on the multiple health risks of obesity as well as the benefit of weight loss to improve her health. She was advised of the need for long term treatment and the importance of lifestyle modifications to improve her current health and to decrease her risk of future health problems.  AGREE: Multiple dietary modification options and treatment options were discussed and  Michelle Pratt agreed to follow the recommendations documented in the above note.  ARRANGE: Michelle Pratt was  educated on the importance of  frequent visits to treat obesity as outlined per CMS and USPSTF guidelines and agreed to schedule her next follow up appointment today.  Cristi Loron, am acting as Energy manager for [employer]. Manson Passey, DO  I have reviewed the above documentation for accuracy and completeness, and I agree with the above. -Corinna Capra, DO

## 2018-07-27 ENCOUNTER — Other Ambulatory Visit: Payer: Self-pay | Admitting: Family Medicine

## 2018-07-27 DIAGNOSIS — F321 Major depressive disorder, single episode, moderate: Secondary | ICD-10-CM

## 2018-07-28 MED FILL — FLUoxetine HCL 20 MG TABS: 20 | 30 days supply | Qty: 30 | Fill #0

## 2018-07-29 ENCOUNTER — Encounter (INDEPENDENT_AMBULATORY_CARE_PROVIDER_SITE_OTHER): Payer: Self-pay | Admitting: Bariatrics

## 2018-07-29 ENCOUNTER — Other Ambulatory Visit: Payer: Self-pay

## 2018-07-29 ENCOUNTER — Ambulatory Visit (INDEPENDENT_AMBULATORY_CARE_PROVIDER_SITE_OTHER): Payer: 59 | Admitting: Bariatrics

## 2018-07-29 VITALS — BP 134/74 | HR 59 | Temp 98.0°F | Ht 70.0 in | Wt 194.0 lb

## 2018-07-29 DIAGNOSIS — R7303 Prediabetes: Secondary | ICD-10-CM | POA: Diagnosis not present

## 2018-07-29 DIAGNOSIS — Z9189 Other specified personal risk factors, not elsewhere classified: Secondary | ICD-10-CM

## 2018-07-29 DIAGNOSIS — E669 Obesity, unspecified: Secondary | ICD-10-CM | POA: Diagnosis not present

## 2018-07-29 DIAGNOSIS — Z6830 Body mass index (BMI) 30.0-30.9, adult: Secondary | ICD-10-CM

## 2018-07-29 DIAGNOSIS — E559 Vitamin D deficiency, unspecified: Secondary | ICD-10-CM

## 2018-07-29 MED ORDER — VITAMIN D (ERGOCALCIFEROL) 1.25 MG (50000 UNIT) PO CAPS: 50000.0000 [IU] | ORAL_CAPSULE | ORAL | 0 refills | Status: DC

## 2018-07-29 NOTE — Progress Notes (Signed)
Office: [phone]  /  Fax: [phone]   HPI:   Chief Complaint: OBESITY Michelle Pratt is here to discuss her progress with her obesity treatment plan. She is on the Category 3 plan and is following her eating plan approximately 40 % of the time. She states she is exercising 0 minutes 0 times per week. Michelle Pratt has struggled over the last few weeks. Her water weight is up one pound. Her weight is 194 lb (88 kg) today and has had a weight gain of 1 pound over a period of 3 weeks since her last visit. She has lost 13 lbs since starting treatment with [place].  Vitamin D deficiency Michelle Pratt has a diagnosis of vitamin D deficiency. She is currently taking vit D and denies nausea, vomiting or muscle weakness.  Pre-Diabetes Michelle Pratt has a diagnosis of prediabetes based on her elevated Hgb A1c and was informed this puts her at greater risk of developing diabetes. Her last A1c was at 5.9 and last insulin level was at 10.1 She is not taking medications currently and continues to work on diet and exercise to decrease risk of diabetes. She denies polyphagia.  At risk for diabetes Michelle Pratt is at higher than average risk for developing diabetes due to her obesity and prediabetes. She currently denies polyuria or polydipsia.  ASSESSMENT AND PLAN:  Vitamin D deficiency - Plan: VITAMIN D 25 Hydroxy (Vit-D Deficiency, Fractures), Vitamin D, Ergocalciferol, (DRISDOL) 1.25 MG (50000 UT) CAPS capsule  Prediabetes - Plan: Comprehensive metabolic panel, Hemoglobin A1c, Insulin, random  At risk for diabetes mellitus  Class 1 obesity with serious comorbidity and body mass index (BMI) of 30.0 to 30.9 in adult, unspecified obesity type - BMI greater than 30 at start of program   PLAN:  Vitamin D Deficiency [name] was informed that low vitamin D levels contributes to fatigue and are associated with obesity, breast, and colon cancer. She agrees to continue to take prescription Vit D ,000 IU every week #4 with no  refills and will follow up for routine testing of vitamin D, at least 2-3 times per year. She was informed of the risk of over-replacement of vitamin D and agrees to not increase her dose unless she discusses this with [place] first. [name] agrees to follow up as directed.  Pre-Diabetes Michelle Pratt will continue to work on weight loss, exercise, increasing lean protein and decreasing simple carbohydrates in her diet to help decrease the risk of diabetes. She was informed that eating too many simple carbohydrates or too many calories at one sitting increases the likelihood of GI side effects. Michelle Pratt agreed to follow up with [place] as directed to monitor her progress.  Diabetes risk counseling Michelle Pratt was given extended (15 minutes) diabetes prevention counseling today. She is 63 y.o. female and has risk factors for diabetes including obesity and prediabetes. We discussed intensive lifestyle modifications today with an emphasis on weight loss as well as increasing exercise and decreasing simple carbohydrates in her diet.  Obesity Michelle Pratt is currently in the action stage of change. As such, her goal is to continue with weight loss efforts She has agreed to follow the Category 3 plan Michelle Pratt has been instructed to work up to a goal of 150 minutes of combined cardio and strengthening exercise per week for weight loss and overall health benefits. We discussed the following Behavioral Modification Strategies today: increase H2O intake, no skipping meals, keeping healthy foods in the home, increasing lean protein intake, decreasing simple carbohydrates, increasing vegetables, decrease eating out and work  on meal planning and easy cooking plans  Michelle Pratt has agreed to follow up with our clinic in 4 weeks. She was informed of the importance of frequent follow up visits to maximize her success with intensive lifestyle modifications for her multiple health conditions.  ALLERGIES: Allergies  Allergen Reactions  . Bee  Venom Anaphylaxis and Swelling  . Morphine Nausea And Vomiting    MEDICATIONS: Current Outpatient Medications on File Prior to Visit  Medication Sig Dispense Refill  . acetaminophen (TYLENOL) 325 MG tablet Take 650 mg by mouth every 6 (six) hours as needed.    . Calcium Carb-Cholecalciferol (CALCIUM 1000 + D PO) Take 1 tablet by mouth daily.    Michelle Pratt EPINEPHrine (EPIPEN) 0.3 mg/0.3 mL IJ SOAJ injection Inject 0.3 mLs (0.3 mg total) into the muscle once as needed (allergic reaction). 1 Device 0  . FLUoxetine (PROZAC) 20 MG tablet TAKE 1 TABLET (20 MG TOTAL) BY MOUTH DAILY. 30 tablet 3  . levothyroxine (SYNTHROID, LEVOTHROID) 112 MCG tablet TAKE 1 TABLET BY MOUTH ONCE DAILY 90 tablet 1  . Melatonin 3 MG CAPS Take 1 capsule by mouth.    . NON FORMULARY CBD oil 500 mg PRN for sleep     No current facility-administered medications on file prior to visit.     PAST MEDICAL HISTORY: Past Medical History:  Diagnosis Date  . Depression   . Glaucoma   . Joint pain   . Menopause   . Thyroid disease    hypo  . Vitamin D deficiency     PAST SURGICAL HISTORY: Past Surgical History:  Procedure Laterality Date  . oraf     left eyebrow  . ORIF ELBOW FRACTURE    . TOE SURGERY Right    right great toe  . TONSILLECTOMY    . TUBAL LIGATION      SOCIAL HISTORY: Social History   Tobacco Use  . Smoking status: Former Games developer  . Smokeless tobacco: Never Used  . Tobacco comment: over 20 years ago  Substance Use Topics  . Alcohol use: Yes    Alcohol/week: 3.0 standard drinks    Types: 3 Glasses of wine per week  . Drug use: No    FAMILY HISTORY: Family History  Problem Relation Age of Onset  . Alcohol abuse Mother   . Parkinsonism Mother   . Hypertension Father   . Cancer Father 65       prostate  . Hyperlipidemia Father   . Stroke Brother     ROS: Review of Systems  Constitutional: Negative for weight loss.  Gastrointestinal: Negative for nausea and vomiting.  Genitourinary:  Negative for frequency.  Musculoskeletal:       Negative for muscle weakness  Endo/Heme/Allergies: Negative for polydipsia.       Negative for polyphagia    PHYSICAL EXAM: Blood pressure 134/74, pulse (!) 59, temperature 98 F (36.7 C), temperature source Oral, height 5\' 10"  (1.778 m), weight 194 lb (88 kg), SpO2 97 %. Body mass index is 27.84 kg/m. Physical Exam Vitals signs reviewed.  Constitutional:      Appearance: Normal appearance. She is well-developed. She is obese.  Cardiovascular:     Rate and Rhythm: Normal rate.  Pulmonary:     Effort: Pulmonary effort is normal.  Musculoskeletal: Normal range of motion.  Skin:    General: Skin is warm and dry.  Neurological:     Mental Status: She is alert and oriented to person, place, and time.  Psychiatric:  Mood and Affect: Mood normal.        Behavior: Behavior normal.     RECENT LABS AND TESTS: BMET    Component Value Date/Time   NA 139 02/02/2018 0947   K 4.5 02/02/2018 0947   CL 101 02/02/2018 0947   CO2 23 02/02/2018 0947   GLUCOSE 93 02/02/2018 0947   GLUCOSE 119 (H) 11/10/2017 0753   BUN 18 02/02/2018 0947   CREATININE 0.77 02/02/2018 0947   CALCIUM 10.2 02/02/2018 0947   GFRNONAA 84 02/02/2018 0947   GFRAA 96 02/02/2018 0947   Lab Results  Component Value Date   HGBA1C 5.9 12/17/2017   Lab Results  Component Value Date   INSULIN 10.1 02/02/2018   CBC    Component Value Date/Time   WBC 5.8 11/10/2017 0753   RBC 4.54 11/10/2017 0753   HGB 13.7 11/10/2017 0753   HCT 40.8 11/10/2017 0753   PLT 299.0 11/10/2017 0753   MCV 89.8 11/10/2017 0753   MCHC 33.5 11/10/2017 0753   RDW 14.0 11/10/2017 0753   LYMPHSABS 1.8 11/10/2017 0753   MONOABS 0.5 11/10/2017 0753   EOSABS 0.2 11/10/2017 0753   BASOSABS 0.0 11/10/2017 0753   Iron/TIBC/Ferritin/ %Sat No results found for: IRON, TIBC, FERRITIN, IRONPCTSAT Lipid Panel     Component Value Date/Time   CHOL 203 (H) 11/10/2017 0753   TRIG 110.0  11/10/2017 0753   HDL 59.90 11/10/2017 0753   CHOLHDL 3 11/10/2017 0753   VLDL 22.0 11/10/2017 0753   LDLCALC 121 (H) 11/10/2017 0753   LDLDIRECT 133.3 03/11/2012 1112   Hepatic Function Panel     Component Value Date/Time   PROT 6.9 02/02/2018 0947   ALBUMIN 5.0 (H) 02/02/2018 0947   AST 25 02/02/2018 0947   ALT 37 (H) 02/02/2018 0947   ALKPHOS 74 02/02/2018 0947   BILITOT 0.4 02/02/2018 0947   BILIDIR 0.1 12/17/2017 0808      Component Value Date/Time   TSH 2.850 02/02/2018 0947   TSH 3.27 11/10/2017 0753   TSH 1.46 11/12/2016 0819   Results for LUCILLIA, RAGASA (MRN [id number]) as of 07/29/2018 09:38  Ref. Range 02/02/2018 09:47  Vitamin D, 25-Hydroxy Latest Ref Range: 30.0 - 100.0 ng/mL 27.3 (L)     OBESITY BEHAVIORAL INTERVENTION VISIT  Today's visit was # 7   Starting weight: 207 lbs Starting date: 02/02/2018 Today's weight : 194 lbs Today's date: 07/29/2018 Total lbs lost to date: 13    07/29/2018  Height 5\' 10"  (1.778 m)  Weight 194 lb (88 kg)  BMI (Calculated) 27.84  BLOOD PRESSURE - SYSTOLIC 134  BLOOD PRESSURE - DIASTOLIC 74   Body Fat % 39.4 %  Total Body Water (lbs) 73.8 lbs    ASK: We discussed the diagnosis of obesity with Vonzella Nipple today and Jamayia agreed to give [place] permission to discuss obesity behavioral modification therapy today.  ASSESS: Kainani has the diagnosis of obesity and her BMI today is 27.84 Korri is in the action stage of change   ADVISE: Giselda was educated on the multiple health risks of obesity as well as the benefit of weight loss to improve her health. She was advised of the need for long term treatment and the importance of lifestyle modifications to improve her current health and to decrease her risk of future health problems.  AGREE: Multiple dietary modification options and treatment options were discussed and  Humayra agreed to follow the recommendations documented in the above note.  ARRANGE: Yaretsi was  educated on the importance of frequent visits to treat obesity as outlined per CMS and USPSTF guidelines and agreed to schedule her next follow up appointment today.  Cristi Loron, am acting as Energy manager for [employer]. Manson Passey, DO  I have reviewed the above documentation for accuracy and completeness, and I agree with the above. -Corinna Capra, DO

## 2018-07-30 ENCOUNTER — Encounter (INDEPENDENT_AMBULATORY_CARE_PROVIDER_SITE_OTHER): Payer: Self-pay

## 2018-07-30 LAB — COMPREHENSIVE METABOLIC PANEL
ALK PHOS: 61 IU/L (ref 39–117)
ALT: 30 IU/L (ref 0–32)
AST: 22 IU/L (ref 0–40)
Albumin/Globulin Ratio: 3 — ABNORMAL HIGH (ref 1.2–2.2)
Albumin: 4.8 g/dL (ref 3.8–4.8)
BUN/Creatinine Ratio: 39 — ABNORMAL HIGH (ref 12–28)
BUN: 27 mg/dL (ref 8–27)
Bilirubin Total: 0.3 mg/dL (ref 0.0–1.2)
CO2: 20 mmol/L (ref 20–29)
Calcium: 9.3 mg/dL (ref 8.7–10.3)
Chloride: 103 mmol/L (ref 96–106)
Creatinine, Ser: 0.69 mg/dL (ref 0.57–1.00)
GFR calc Af Amer: 109 mL/min/{1.73_m2} (ref >59)
GFR calc non Af Amer: 94 mL/min/{1.73_m2} (ref >59)
Globulin, Total: 1.6 g/dL (ref 1.5–4.5)
Glucose: 101 mg/dL — ABNORMAL HIGH (ref 65–99)
Potassium: 4.4 mmol/L (ref 3.5–5.2)
Sodium: 142 mmol/L (ref 134–144)
Total Protein: 6.4 g/dL (ref 6.0–8.5)

## 2018-07-30 LAB — HEMOGLOBIN A1C
Est. average glucose Bld gHb Est-mCnc: 114 mg/dL
HEMOGLOBIN A1C: 5.6 % (ref 4.8–5.6)

## 2018-07-30 LAB — VITAMIN D 25 HYDROXY (VIT D DEFICIENCY, FRACTURES): Vit D, 25-Hydroxy: 22.9 ng/mL — ABNORMAL LOW (ref 30.0–100.0)

## 2018-07-30 LAB — INSULIN, RANDOM: INSULIN: 7.7 u[IU]/mL (ref 2.6–24.9)

## 2018-08-04 ENCOUNTER — Encounter (INDEPENDENT_AMBULATORY_CARE_PROVIDER_SITE_OTHER): Payer: Self-pay

## 2018-08-24 ENCOUNTER — Encounter (INDEPENDENT_AMBULATORY_CARE_PROVIDER_SITE_OTHER): Payer: Self-pay

## 2018-08-26 ENCOUNTER — Ambulatory Visit (INDEPENDENT_AMBULATORY_CARE_PROVIDER_SITE_OTHER): Payer: Self-pay | Admitting: Bariatrics

## 2018-08-26 MED FILL — LEVOTHYROXINE 112 MCG TAB: 112 | 90 days supply | Qty: 90 | Fill #1

## 2018-08-26 MED FILL — FLUoxetine HCL 20 MG TABS: 20 | 30 days supply | Qty: 30 | Fill #1

## 2018-09-28 MED FILL — FLUOXETINE HCL 20 MG TABS: 20 | 30 days supply | Qty: 30 | Fill #2

## 2018-10-26 ENCOUNTER — Telehealth: Payer: 59 | Admitting: Physician Assistant

## 2018-10-26 ENCOUNTER — Encounter: Payer: Self-pay | Admitting: Physician Assistant

## 2018-10-26 DIAGNOSIS — R519 Headache, unspecified: Secondary | ICD-10-CM

## 2018-10-26 DIAGNOSIS — H5789 Other specified disorders of eye and adnexa: Secondary | ICD-10-CM

## 2018-10-26 NOTE — Progress Notes (Signed)
Based on what you shared with me, I feel your condition warrants further evaluation and I recommend that you be seen for a face to face office visit.   As per our conversation,  Please follow up with your doctor for a virtual or face to face visit for further workup of your symptoms and to rule out any infection of the face or eye. You have stated that you have swelling, redness, and pain around the R eye and a "blocked" tear duct.    NOTE: If you entered your credit card information for this eVisit, you will not be charged. You may see a "hold" on your card for the $35 but that hold will drop off and you will not have a charge processed.  If you are having a true medical emergency please call 911.     For an urgent face to face visit, [facility] has five urgent care centers for your convenience:    [email or web address] to reserve your spot online an avoid wait times  [facility] [address] [place], [place] [address] Modified hours of operation: Monday-Friday, 12 PM to 6 PM  Closed Saturday & Sunday  *Across the street from [place] (New Address!) [address], [place], [place] [address] *Just off [address], across the road from [place] hours of operation: Monday-Friday, 12 PM to 6 PM  Closed Saturday & Sunday   The following sites will take your insurance:  . [facility]    [phone]                  Get Driving Directions  [address], [place] [address] . 10 am to 8 pm Monday-Friday . 12 pm to 8 pm Saturday-Sunday   . [facility] at [place]                  Get Driving Directions  [address], [place], [place] [address] . 8 am to 8 pm Monday-Friday . 9 am to 6 pm Saturday . 11 am to 6 pm Sunday   . [facility] at [place]                   Get Driving Directions   [address] [place], [place] [address] . 8 am to 8 pm Monday-Friday . 8 am to 4 pm Saturday-Sunday    . [facility] at [place]                    Get Driving Directions  [phone]  [address], [place],  [address]  . Monday-Friday, 12 PM to 6 PM    Your e-visit answers were reviewed by a board certified advanced clinical practitioner to complete your personal care plan.  Thank you for using e-Visits. I spent 5-10 minutes on review and completion of this note- Lacy Duverney [facility]

## 2018-10-30 MED FILL — FLUOXETINE HCL 20 MG TABS: 20 | 30 days supply | Qty: 30 | Fill #3

## 2018-11-25 ENCOUNTER — Other Ambulatory Visit: Payer: Self-pay | Admitting: Family Medicine

## 2018-11-26 MED FILL — LEVOTHYROXINE 112 MCG TAB: 112 | 60 days supply | Qty: 60 | Fill #0

## 2018-11-30 ENCOUNTER — Other Ambulatory Visit: Payer: Self-pay | Admitting: Family Medicine

## 2018-11-30 DIAGNOSIS — F321 Major depressive disorder, single episode, moderate: Secondary | ICD-10-CM

## 2018-12-01 MED FILL — FLUOXETINE HCL 20 MG TABS: 20 | 30 days supply | Qty: 30 | Fill #0

## 2019-01-04 MED FILL — FLUOXETINE HCL 20 MG TABS: 20 | 30 days supply | Qty: 30 | Fill #0

## 2019-02-08 ENCOUNTER — Other Ambulatory Visit: Payer: Self-pay | Admitting: Family Medicine

## 2019-02-08 ENCOUNTER — Telehealth: Payer: Self-pay | Admitting: *Deleted

## 2019-02-08 DIAGNOSIS — F321 Major depressive disorder, single episode, moderate: Secondary | ICD-10-CM

## 2019-02-08 MED FILL — LEVOTHYROXINE 112 MCG TAB: 112 | 30 days supply | Qty: 30 | Fill #0

## 2019-02-08 MED FILL — FLUOXETINE HCL 20 MG TABS: 20 | 30 days supply | Qty: 30 | Fill #0

## 2019-02-08 NOTE — Telephone Encounter (Signed)
Appointment made for Friday with Dr. Nani Ravens, patient is off of work.  Advised that she must keep appointment to continue to get medications.  Who Is Calling Patient / Member / Family / Caregiver Call Type Triage / Clinical Relationship To Patient Self Return Phone Number [phone] (Primary) Chief Complaint Prescription Refill or Medication Request (non symptomatic) Reason for Call Medication Question / Request Initial Comment Caller states that she is needing an Rx refill. States that she was told she needed to be seen before the Rx could be refilled. States that she would like one refill to hold her over until she can be seen in office. States that she only has 2 doses of her medications until she is out completely. Asymptomatic. Translation No Nurse Assessment Nurse: Cain Sieve, RN, Clarise Cruz Date/Time (Eastern Time): 02/07/2019 11:59:45 AM Please select the assessment type ---Refill Additional Documentation ---Caller states that she is needing an Rx refill. States that she was told she needed to be seen before the Rx could be refilled. States that she would like one refill to hold her over until she can be seen in office. States that she only has 2 doses of her medications until she is out completely. Asymptomatic. Does the patient have enough medication to last until the office opens? ---Yes Additional Documentation ---Levothyroxine 145mcg daily and fluoxetine 20mg  daily is what she is needing refilled. Has 2 days left. States she has a day off on Friday to see the Dr. Quintella Baton she would like an appointment then to see the Dr for her check up, but she will need some medicine to make it to her appointment on Friday. Please call to set up.

## 2019-02-12 ENCOUNTER — Ambulatory Visit: Payer: 59 | Admitting: Family Medicine

## 2019-02-12 ENCOUNTER — Other Ambulatory Visit: Payer: Self-pay

## 2019-02-12 ENCOUNTER — Encounter: Payer: Self-pay | Admitting: Family Medicine

## 2019-02-12 VITALS — BP 108/68 | HR 56 | Temp 97.4°F | Ht 70.0 in | Wt 202.0 lb

## 2019-02-12 DIAGNOSIS — E038 Other specified hypothyroidism: Secondary | ICD-10-CM

## 2019-02-12 DIAGNOSIS — F321 Major depressive disorder, single episode, moderate: Secondary | ICD-10-CM

## 2019-02-12 DIAGNOSIS — E559 Vitamin D deficiency, unspecified: Secondary | ICD-10-CM | POA: Diagnosis not present

## 2019-02-12 HISTORY — DX: Major depressive disorder, single episode, moderate: F32.1

## 2019-02-12 LAB — VITAMIN D 25 HYDROXY (VIT D DEFICIENCY, FRACTURES): VITD: 25.05 ng/mL — ABNORMAL LOW (ref 30.00–100.00)

## 2019-02-12 LAB — TSH: TSH: 3.03 u[IU]/mL (ref 0.35–4.50)

## 2019-02-12 LAB — T4, FREE: Free T4: 0.99 ng/dL (ref 0.60–1.60)

## 2019-02-12 NOTE — Patient Instructions (Signed)
Give us 2-3 business days to get the results of your labs back.   Keep the diet clean and stay active.  Let us know if you need anything. 

## 2019-02-12 NOTE — Progress Notes (Signed)
Chief Complaint  Patient presents with  . Follow-up    Subjective Michelle Pratt presents for f/u anxiety/depression.  She is currently being treated with Prozac 20 mg/d.  Reports good success since treatment. No thoughts of harming self or others. No self-medication with alcohol, prescription drugs or illicit drugs. Pt is not following with a counselor/psychologist.  Hypothyroidism Patient presents for follow-up of hypothyroidism.  Reports compliance with medication- Synthroid 112 mg/d Current symptoms include: denies fatigue, weight changes, heat/cold intolerance, skin changes or CVS symptoms  Hx of Vit D def. Was on weekly supp, stopped taking and never had her levels rechecked. Did not notice a difference while on it. Does not take OTC supp.   ROS Psych: No homicidal or suicidal thoughts Endo: As noted in HPI  Past Medical History:  Diagnosis Date  . Depression   . Glaucoma   . Joint pain   . Menopause   . Thyroid disease    hypo  . Vitamin D deficiency    Allergies as of 02/12/2019      Reactions   Bee Venom Anaphylaxis, Swelling   Morphine Nausea And Vomiting      Medication List       Accurate as of February 12, 2019  9:26 AM. If you have any questions, ask your nurse or doctor.        STOP taking these medications   NON FORMULARY Stopped by: Shelda Pal, DO     TAKE these medications   acetaminophen 325 MG tablet Commonly known as: TYLENOL Take 650 mg by mouth every 6 (six) hours as needed.   CALCIUM 1000 + D PO Take 1 tablet by mouth daily.   EPINEPHrine 0.3 mg/0.3 mL Soaj injection Commonly known as: EpiPen Inject 0.3 mLs (0.3 mg total) into the muscle once as needed (allergic reaction).   FLUoxetine 20 MG tablet Commonly known as: PROZAC TAKE 1 TABLET BY MOUTH ONCE A DAY   levothyroxine 112 MCG tablet Commonly known as: SYNTHROID Take 1 tablet (112 mcg total) by mouth daily.   Melatonin 3 MG Caps Take 1 capsule by mouth.  Vitamin D (Ergocalciferol) 1.25 MG (50000 UT) Caps capsule Commonly known as: DRISDOL Take 1 capsule (50,000 Units total) by mouth every 7 (seven) days.       Exam BP 108/68 (BP Location: Left Arm, Patient Position: Sitting, Cuff Size: Normal)   Pulse (!) 56   Temp (!) 97.4 F (36.3 C) (Temporal)   Ht 5\' 10"  (1.778 m)   Wt 202 lb (91.6 kg)   SpO2 96%   BMI 28.98 kg/m  General:  well developed, well nourished, in no apparent distress Neck: neck supple without adenopathy, thyromegaly, or masses Lungs:  clear to auscultation, breath sounds equal bilaterally, no respiratory distress Cardio:  regular rate and rhythm without murmurs, heart sounds without clicks or rubs Abd: S, NT, ND, no masses or organomegaly Psych: well oriented with normal range of affect and age-appropriate judgement/insight, alert and oriented x4.  Assessment and Plan  Depression, major, single episode, moderate ([place])  Other specified hypothyroidism - Plan: TSH, T4, free  Vitamin D deficiency - Plan: Vitamin D (25 hydroxy)  Cont current meds. Ck labs.  F/u in 6 mo for CPE. The patient voiced understanding and agreement to the plan.  [place], DO 02/12/19 9:26 AM

## 2019-02-16 MED FILL — CIPROFLOXACIN HCL 500 MG TA: 500 | 5 days supply | Qty: 10 | Fill #0

## 2019-02-25 DIAGNOSIS — H524 Presbyopia: Secondary | ICD-10-CM | POA: Diagnosis not present

## 2019-02-25 DIAGNOSIS — H2513 Age-related nuclear cataract, bilateral: Secondary | ICD-10-CM | POA: Diagnosis not present

## 2019-03-15 ENCOUNTER — Other Ambulatory Visit: Payer: Self-pay | Admitting: Family Medicine

## 2019-03-15 DIAGNOSIS — F321 Major depressive disorder, single episode, moderate: Secondary | ICD-10-CM

## 2019-03-15 MED FILL — FLUOXETINE HCL 20 MG TABS: 20 | 90 days supply | Qty: 90 | Fill #0

## 2019-03-15 MED FILL — LEVOTHYROXINE 112 MCG TAB: 112 | 90 days supply | Qty: 90 | Fill #0

## 2019-06-28 MED FILL — LEVOTHYROXINE 112 MCG TAB: 112 | 90 days supply | Qty: 90 | Fill #1

## 2019-06-28 MED FILL — FLUOXETINE HCL 20 MG TABS: 20 | 90 days supply | Qty: 90 | Fill #1

## 2019-08-12 ENCOUNTER — Other Ambulatory Visit: Payer: Self-pay

## 2019-08-16 ENCOUNTER — Other Ambulatory Visit (HOSPITAL_BASED_OUTPATIENT_CLINIC_OR_DEPARTMENT_OTHER): Payer: Self-pay | Admitting: Family Medicine

## 2019-08-16 ENCOUNTER — Ambulatory Visit (INDEPENDENT_AMBULATORY_CARE_PROVIDER_SITE_OTHER): Payer: 59 | Admitting: Family Medicine

## 2019-08-16 ENCOUNTER — Other Ambulatory Visit: Payer: Self-pay

## 2019-08-16 ENCOUNTER — Other Ambulatory Visit (HOSPITAL_COMMUNITY)
Admission: RE | Admit: 2019-08-16 | Discharge: 2019-08-16 | Disposition: A | Discharge status: Home / Self Care | Payer: 59 | Source: Ambulatory Visit | Attending: Family Medicine | Admitting: Family Medicine

## 2019-08-16 ENCOUNTER — Encounter: Payer: Self-pay | Admitting: Family Medicine

## 2019-08-16 VITALS — BP 110/70 | HR 54 | Temp 97.5°F | Resp 18 | Ht 70.0 in | Wt 209.8 lb

## 2019-08-16 DIAGNOSIS — F418 Other specified anxiety disorders: Secondary | ICD-10-CM

## 2019-08-16 DIAGNOSIS — Z0000 Encounter for general adult medical examination without abnormal findings: Secondary | ICD-10-CM | POA: Diagnosis not present

## 2019-08-16 DIAGNOSIS — E2839 Other primary ovarian failure: Secondary | ICD-10-CM

## 2019-08-16 DIAGNOSIS — Z1231 Encounter for screening mammogram for malignant neoplasm of breast: Secondary | ICD-10-CM

## 2019-08-16 DIAGNOSIS — E039 Hypothyroidism, unspecified: Secondary | ICD-10-CM

## 2019-08-16 DIAGNOSIS — Z1211 Encounter for screening for malignant neoplasm of colon: Secondary | ICD-10-CM | POA: Diagnosis not present

## 2019-08-16 LAB — CBC WITH DIFFERENTIAL/PLATELET
Basophils Absolute: 0 10*3/uL (ref 0.0–0.1)
Basophils Relative: 0.9 % (ref 0.0–3.0)
Eosinophils Absolute: 0.1 10*3/uL (ref 0.0–0.7)
Eosinophils Relative: 2.6 % (ref 0.0–5.0)
HCT: 42.6 % (ref 36.0–46.0)
Hemoglobin: 14.3 g/dL (ref 12.0–15.0)
Lymphocytes Relative: 40.5 % (ref 12.0–46.0)
Lymphs Abs: 1.9 10*3/uL (ref 0.7–4.0)
MCHC: 33.5 g/dL (ref 30.0–36.0)
MCV: 93 fl (ref 78.0–100.0)
Monocytes Absolute: 0.4 10*3/uL (ref 0.1–1.0)
Monocytes Relative: 7.7 % (ref 3.0–12.0)
Neutro Abs: 2.3 10*3/uL (ref 1.4–7.7)
Neutrophils Relative %: 48.3 % (ref 43.0–77.0)
Platelets: 267 10*3/uL (ref 150.0–400.0)
RBC: 4.58 Mil/uL (ref 3.87–5.11)
RDW: 13.3 % (ref 11.5–15.5)
WBC: 4.7 10*3/uL (ref 4.0–10.5)

## 2019-08-16 LAB — LIPID PANEL
Cholesterol: 213 mg/dL — ABNORMAL HIGH (ref 0–200)
HDL: 62.9 mg/dL (ref >39.00)
LDL Cholesterol: 129 mg/dL — ABNORMAL HIGH (ref 0–99)
NonHDL: 150.18
Total CHOL/HDL Ratio: 3
Triglycerides: 107 mg/dL (ref 0.0–149.0)
VLDL: 21.4 mg/dL (ref 0.0–40.0)

## 2019-08-16 LAB — TSH: TSH: 6.85 u[IU]/mL — ABNORMAL HIGH (ref 0.35–4.50)

## 2019-08-16 LAB — COMPREHENSIVE METABOLIC PANEL
ALT: 34 U/L (ref 0–35)
AST: 20 U/L (ref 0–37)
Albumin: 4.4 g/dL (ref 3.5–5.2)
Alkaline Phosphatase: 57 U/L (ref 39–117)
BUN: 21 mg/dL (ref 6–23)
CO2: 29 mEq/L (ref 19–32)
Calcium: 9.3 mg/dL (ref 8.4–10.5)
Chloride: 102 mEq/L (ref 96–112)
Creatinine, Ser: 0.81 mg/dL (ref 0.40–1.20)
GFR: 71.41 mL/min (ref >60.00)
Glucose, Bld: 99 mg/dL (ref 70–99)
Potassium: 4.3 mEq/L (ref 3.5–5.1)
Sodium: 138 mEq/L (ref 135–145)
Total Bilirubin: 0.5 mg/dL (ref 0.2–1.2)
Total Protein: 6.3 g/dL (ref 6.0–8.3)

## 2019-08-16 MED ORDER — FLUOXETINE HCL 10 MG PO CAPS: 10.0000 mg | ORAL_CAPSULE | Freq: Every day | ORAL | 3 refills | Status: DC

## 2019-08-16 MED FILL — FLUoxetine HCL 10 MG CAPS: 10 | 90 days supply | Qty: 90 | Fill #0

## 2019-08-16 NOTE — Progress Notes (Signed)
Subjective:     Michelle Pratt is a 64 y.o. female and is here for a comprehensive physical exam. The patient reports no problems. She is doing well with depression and anxiety and would like to decrease the prozac She is also f/u on thyroid meds   Social History   Socioeconomic History  . Marital status: Married    Spouse name: [name]  . Number of children: 3  . Years of education: Not on file  . Highest education level: Not on file  Occupational History  . Occupation: Programmer, multimedia: [employer]  Tobacco Use  . Smoking status: Former Research scientist (life sciences)  . Smokeless tobacco: Never Used  . Tobacco comment: over 20 years ago  Substance and Sexual Activity  . Alcohol use: Yes    Alcohol/week: 3.0 standard drinks    Types: 3 Glasses of wine per week  . Drug use: No  . Sexual activity: Yes    Partners: Male  Other Topics Concern  . Not on file  Social History Narrative   Exercise--some bike riding   Caffeine Use: daily 1 cup of coffee   Social Determinants of Health   Financial Resource Strain:   . Difficulty of Paying Living Expenses:   Food Insecurity:   . Worried About Charity fundraiser in the Last Year:   . Arboriculturist in the Last Year:   Transportation Needs:   . Film/video editor (Medical):   [name] Lack of Transportation (Non-Medical):   Physical Activity:   . Days of Exercise per Week:   . Minutes of Exercise per Session:   Stress:   . Feeling of Stress :   Social Connections:   . Frequency of Communication with Friends and Family:   . Frequency of Social Gatherings with Friends and Family:   . Attends Religious Services:   . Active Member of Clubs or Organizations:   . Attends Archivist Meetings:   [name] Marital Status:   Intimate Partner Violence:   . Fear of Current or Ex-Partner:   . Emotionally Abused:   [name] Physically Abused:   . Sexually Abused:    Health Maintenance  Topic Date Due  . PAP SMEAR-Modifier  09/26/2018  . MAMMOGRAM   11/11/2018  . INFLUENZA VACCINE  12/12/2019  . COLONOSCOPY  11/04/2020  . TETANUS/TDAP  09/04/2024  . Hepatitis C Screening  Completed  . HIV Screening  Completed    The following portions of the patient's history were reviewed and updated as appropriate:  She  has a past medical history of Depression, Glaucoma, Joint pain, Menopause, Thyroid disease, and Vitamin D deficiency. She does not have any pertinent problems on file. She  has a past surgical history that includes Tonsillectomy; Tubal ligation; oraf; ORIF elbow fracture; and foot (Left, 2019). Her family history includes Alcohol abuse in her mother; Cancer (age of onset: 64) in her father; Hyperlipidemia in her father; Hypertension in her father; Parkinsonism in her mother; Stroke in her brother. She  reports that she quit smoking about 27 years ago. She has a 13.50 pack-year smoking history. She has never used smokeless tobacco. She reports current alcohol use of about 3.0 standard drinks of alcohol per week. She reports that she does not use drugs. She has a current medication list which includes the following prescription(s): acetaminophen, calcium carb-cholecalciferol, epinephrine, fluoxetine, levothyroxine, melatonin, and vitamin d (ergocalciferol). Current Outpatient Medications on File Prior to Visit  Medication Sig Dispense  Refill  . acetaminophen (TYLENOL) 325 MG tablet Take 650 mg by mouth every 6 (six) hours as needed.    . Calcium Carb-Cholecalciferol (CALCIUM 1000 + D PO) Take 1 tablet by mouth daily.    [name] EPINEPHrine (EPIPEN) 0.3 mg/0.3 mL IJ SOAJ injection Inject 0.3 mLs (0.3 mg total) into the muscle once as needed (allergic reaction). 1 Device 0  . FLUoxetine (PROZAC) 20 MG tablet TAKE 1 TABLET BY MOUTH ONCE A DAY 90 tablet 1  . levothyroxine (SYNTHROID) 112 MCG tablet TAKE 1 TABLET BY MOUTH ONCE DAILY 90 tablet 1  . Melatonin 3 MG CAPS Take 1 capsule by mouth.    . Vitamin D, Ergocalciferol, (DRISDOL) 1.25 MG (50000  UT) CAPS capsule Take 1 capsule (50,000 Units total) by mouth every 7 (seven) days. (Patient not taking: Reported on 08/16/2019) 4 capsule 0   No current facility-administered medications on file prior to visit.   She is allergic to bee venom and morphine..  Review of Systems Review of Systems  Constitutional: Negative for activity change, appetite change and fatigue.  HENT: Negative for hearing loss, congestion, tinnitus and ear discharge.  dentist q12m Eyes: Negative for visual disturbance (see optho q1y -- vision corrected to 20/20 with glasses).  Respiratory: Negative for cough, chest tightness and shortness of breath.   Cardiovascular: Negative for chest pain, palpitations and leg swelling.  Gastrointestinal: Negative for abdominal pain, diarrhea, constipation and abdominal distention.  Genitourinary: Negative for urgency, frequency, decreased urine volume and difficulty urinating.  Musculoskeletal: Negative for back pain, arthralgias and gait problem.  Skin: Negative for color change, pallor and rash.  Neurological: Negative for dizziness, light-headedness, numbness and headaches.  Hematological: Negative for adenopathy. Does not bruise/bleed easily.  Psychiatric/Behavioral: Negative for suicidal ideas, confusion, sleep disturbance, self-injury, dysphoric mood, decreased concentration and agitation.    Health Maintenance  Topic Date Due  . PAP SMEAR-Modifier  09/26/2018  . MAMMOGRAM  11/11/2018  . INFLUENZA VACCINE  12/12/2019  . COLONOSCOPY  11/04/2020  . TETANUS/TDAP  09/04/2024  . Hepatitis C Screening  Completed  . HIV Screening  Completed     Objective:    BP 110/70 (BP Location: Right Arm, Patient Position: Sitting, Cuff Size: Normal)   Pulse (!) 54   Temp (!) 97.5 F (36.4 C) (Temporal)   Resp 18   Ht 5\' 10"  (1.778 m)   Wt 209 lb 12.8 oz (95.2 kg)   SpO2 97%   BMI 30.10 kg/m  General appearance: alert, cooperative, appears stated age and no distress Head:  Normocephalic, without obvious abnormality, atraumatic Eyes: conjunctivae/corneas clear. PERRL, EOM's intact. Fundi benign. Ears: normal TM's and external ear canals both ears Neck: no adenopathy, no carotid bruit, no JVD, supple, symmetrical, trachea midline and thyroid not enlarged, symmetric, no tenderness/mass/nodules Back: symmetric, no curvature. ROM normal. No CVA tenderness. Lungs: clear to auscultation bilaterally Breasts: normal appearance, no masses or tenderness Heart: regular rate and rhythm, S1, S2 normal, no murmur, click, rub or gallop Abdomen: soft, non-tender; bowel sounds normal; no masses,  no organomegaly Pelvic: cervix normal in appearance, external genitalia normal, no adnexal masses or tenderness, no cervical motion tenderness, rectovaginal septum normal, uterus normal size, shape, and consistency, vagina normal without discharge and pap done Extremities: extremities normal, atraumatic, no cyanosis or edema Pulses: 2+ and symmetric Skin: Skin color, texture, turgor normal. No rashes or lesions Lymph nodes: Cervical, supraclavicular, and axillary nodes normal. Neurologic: Alert and oriented X 3, normal strength and tone. Normal symmetric reflexes.  Normal coordination and gait    Assessment:    Healthy female exam.      Plan:    ghm utd Check labs See After Visit Summary for Counseling Recommendations    1. Estrogen deficiency con't ca and vita d Weight bearing exercise  - DG Bone Density; Future  2. Colon cancer screening  - Fecal occult blood, imunochemical; Future  3. Hypothyroidism, unspecified type Check labs  - TSH  4. Preventative health care See above  - TSH - Lipid panel - CBC with Differential/Platelet - Comprehensive metabolic panel - Cytology - PAP( [facility])  5. Depression with anxiety Pt would like to try to decrease her dose of prozac  - FLUoxetine (PROZAC) 10 MG capsule; Take 1 capsule (10 mg total) by mouth daily.  Dispense:  90 capsule; Refill: 3

## 2019-08-16 NOTE — Patient Instructions (Signed)

## 2019-08-18 ENCOUNTER — Other Ambulatory Visit: Payer: Self-pay | Admitting: Family Medicine

## 2019-08-18 DIAGNOSIS — E039 Hypothyroidism, unspecified: Secondary | ICD-10-CM

## 2019-08-19 ENCOUNTER — Other Ambulatory Visit: Payer: Self-pay

## 2019-08-19 LAB — CYTOLOGY - PAP
Chlamydia: NEGATIVE
Comment: NEGATIVE
Comment: NEGATIVE
Comment: NEGATIVE
Comment: NORMAL
Diagnosis: NEGATIVE
HSV1: NEGATIVE
HSV2: NEGATIVE
Neisseria Gonorrhea: NEGATIVE
Trichomonas: NEGATIVE

## 2019-08-19 MED ORDER — LEVOTHYROXINE SODIUM 125 MCG PO TABS: 125.0000 ug | ORAL_TABLET | Freq: Every day | ORAL | 2 refills | Status: DC

## 2019-08-19 MED FILL — LEVOTHYROXINE 125 MCG TABLE: 125 | 30 days supply | Qty: 30 | Fill #0

## 2019-09-01 ENCOUNTER — Inpatient Hospital Stay (HOSPITAL_BASED_OUTPATIENT_CLINIC_OR_DEPARTMENT_OTHER): Admission: RE | Admit: 2019-09-01 | Payer: 59 | Source: Ambulatory Visit

## 2019-09-01 ENCOUNTER — Other Ambulatory Visit (HOSPITAL_BASED_OUTPATIENT_CLINIC_OR_DEPARTMENT_OTHER): Payer: 59

## 2019-09-07 ENCOUNTER — Other Ambulatory Visit: Payer: Self-pay

## 2019-09-07 ENCOUNTER — Encounter (HOSPITAL_BASED_OUTPATIENT_CLINIC_OR_DEPARTMENT_OTHER): Payer: Self-pay

## 2019-09-07 ENCOUNTER — Ambulatory Visit (HOSPITAL_BASED_OUTPATIENT_CLINIC_OR_DEPARTMENT_OTHER)
Admission: RE | Admit: 2019-09-07 | Discharge: 2019-09-07 | Disposition: A | Discharge status: Home / Self Care | Payer: 59 | Source: Ambulatory Visit | Attending: Family Medicine | Admitting: Family Medicine

## 2019-09-07 DIAGNOSIS — Z780 Asymptomatic menopausal state: Secondary | ICD-10-CM | POA: Diagnosis not present

## 2019-09-07 DIAGNOSIS — Z1231 Encounter for screening mammogram for malignant neoplasm of breast: Secondary | ICD-10-CM

## 2019-09-07 DIAGNOSIS — E2839 Other primary ovarian failure: Secondary | ICD-10-CM | POA: Insufficient documentation

## 2019-09-07 DIAGNOSIS — M85851 Other specified disorders of bone density and structure, right thigh: Secondary | ICD-10-CM | POA: Diagnosis not present

## 2019-09-13 ENCOUNTER — Ambulatory Visit: Payer: 59 | Admitting: Family

## 2019-09-13 ENCOUNTER — Encounter: Payer: Self-pay | Admitting: Family Medicine

## 2019-09-13 ENCOUNTER — Other Ambulatory Visit: Payer: Self-pay

## 2019-09-13 ENCOUNTER — Ambulatory Visit: Payer: 59 | Admitting: Family Medicine

## 2019-09-13 VITALS — BP 128/82 | HR 56 | Temp 96.5°F | Ht 70.0 in | Wt 207.2 lb

## 2019-09-13 DIAGNOSIS — K529 Noninfective gastroenteritis and colitis, unspecified: Secondary | ICD-10-CM

## 2019-09-13 MED ORDER — DICYCLOMINE HCL 10 MG PO CAPS: ORAL_CAPSULE | ORAL | 0 refills | Status: DC

## 2019-09-13 MED ORDER — ONDANSETRON 4 MG PO TBDP: 4.0000 mg | ORAL_TABLET | Freq: Three times a day (TID) | ORAL | 0 refills | Status: DC | PRN

## 2019-09-13 MED FILL — ONDANSETRON ODT 4 MG TABLET: 4 | 6 days supply | Qty: 20 | Fill #0

## 2019-09-13 MED FILL — DICYCLOMINE 10 MG CAPSULE: 10 | 15 days supply | Qty: 60 | Fill #0

## 2019-09-13 NOTE — Patient Instructions (Addendum)
Might want to avoid alfredo :o(   If things don't resolve, let me know.  If there is blood again, I need to know.   Ease back into your normal diet.   Let us know if you need anything.

## 2019-09-13 NOTE — Progress Notes (Signed)
Chief Complaint  Patient presents with  . Blood In Stools  . Diarrhea  . Constipation  . Abdominal Pain     Subjective Michelle Pratt is a 64 y.o. female who presents with vomiting and diarrhea Symptoms began 2 days ago Patient has cramping, diarrhea and blood/mucus in her stool Patient denies vomiting, fever, arthralgias, myalgias and URI symptoms Evaluation to date: Metamucil Sick contacts: none known  She ate seafood Alfredo.  This similar thing happened around 6 months ago at her daughter's wedding after having seafood Alfredo.  Past Medical History:  Diagnosis Date  . Depression   . Glaucoma   . Joint pain   . Menopause   . Thyroid disease    hypo  . Vitamin D deficiency    Past Surgical History:  Procedure Laterality Date  . foot Left 2019   dr Ardelle Anton-- [facility] ----  first metarsal replacement L foot   . oraf     left eyebrow  . ORIF ELBOW FRACTURE    . TONSILLECTOMY    . TUBAL LIGATION      Allergies  Allergen Reactions  . Bee Venom Anaphylaxis and Swelling  . Morphine Nausea And Vomiting    Review of Systems Constitutional:  No fevers or chills Ear/Nose/Mouth/Throat:  No red eyes Gastrointestinal:  As noted in the HPI Musculoskeletal/Extremities: no myalgias Integumentary (Skin/Breast): no rash  Exam BP 128/82 (BP Location: Left Arm, Patient Position: Sitting, Cuff Size: Normal)   Pulse (!) 56   Temp (!) 96.5 F (35.8 C) (Temporal)   Ht 5\' 10"  (1.778 m)   Wt 207 lb 4 oz (94 kg)   SpO2 96%   BMI 29.74 kg/m  General:  well developed, well hydrated, in no apparent distress Skin:  warm, no pallor or diaphoresis, no rashes Throat/Pharynx:  lips and gingiva without lesion; tongue and uvula midline; non-inflamed pharynx; no exudates or postnasal drainage Neck: neck supple without adenopathy, thyromegaly, or masses Lungs:  clear to auscultation, breath sounds equal bilaterally, no respiratory distress, no wheezes Cardio:  regular rate and rhythm  without murmurs Abdomen:  abdomen soft, ttp in lower quadrants; bowel sounds normal; no masses or organomegaly Extremities:  no clubbing, cyanosis, or edema Psych: Appropriate judgement/insight  Assessment and Plan  Gastroenteritis - Plan: dicyclomine (BENTYL) 10 MG capsule, ondansetron (ZOFRAN-ODT) 4 MG disintegrating tablet  Orders as above. Avoid aggravating foods, discussed BRAT diet.  We discussed GI referral, will hold off for now as this sounds more infectious or food related.  If she does get better will refer or if she worsens we will also refer.  If she severely worsen she will seek emergent care. F/u if symptoms fail to improve, sooner if worsening. The patient voiced understanding and agreement to the plan.  [place], DO 09/13/19  4:50 PM

## 2019-10-07 MED FILL — LEVOTHYROXINE 125 MCG TABLE: 125 | 30 days supply | Qty: 30 | Fill #1

## 2019-10-19 ENCOUNTER — Other Ambulatory Visit: Payer: Self-pay

## 2019-10-19 ENCOUNTER — Other Ambulatory Visit (INDEPENDENT_AMBULATORY_CARE_PROVIDER_SITE_OTHER): Payer: 59

## 2019-10-19 DIAGNOSIS — E039 Hypothyroidism, unspecified: Secondary | ICD-10-CM

## 2019-10-20 LAB — THYROID PANEL WITH TSH
Free Thyroxine Index: 3.1 (ref 1.4–3.8)
T3 Uptake: 33 % (ref 22–35)
T4, Total: 9.3 ug/dL (ref 5.1–11.9)
TSH: 0.5 mIU/L (ref 0.40–4.50)

## 2019-11-02 MED FILL — LEVOTHYROXINE 125 MCG TABLE: 125 | 30 days supply | Qty: 30 | Fill #2

## 2019-12-01 ENCOUNTER — Encounter: Payer: Self-pay | Admitting: Family Medicine

## 2019-12-01 ENCOUNTER — Other Ambulatory Visit: Payer: Self-pay | Admitting: Family Medicine

## 2019-12-01 ENCOUNTER — Telehealth (INDEPENDENT_AMBULATORY_CARE_PROVIDER_SITE_OTHER): Payer: 59 | Admitting: Family Medicine

## 2019-12-01 ENCOUNTER — Other Ambulatory Visit: Payer: Self-pay

## 2019-12-01 DIAGNOSIS — M5416 Radiculopathy, lumbar region: Secondary | ICD-10-CM | POA: Diagnosis not present

## 2019-12-01 DIAGNOSIS — F418 Other specified anxiety disorders: Secondary | ICD-10-CM

## 2019-12-01 DIAGNOSIS — M67432 Ganglion, left wrist: Secondary | ICD-10-CM | POA: Diagnosis not present

## 2019-12-01 MED ORDER — PREDNISONE 10 MG PO TABS: ORAL_TABLET | ORAL | 0 refills | Status: DC

## 2019-12-01 MED ORDER — FLUOXETINE HCL 20 MG PO TABS: 20.0000 mg | ORAL_TABLET | Freq: Every day | ORAL | 3 refills | Status: DC

## 2019-12-01 MED ORDER — METHOCARBAMOL 500 MG PO TABS: 500.0000 mg | ORAL_TABLET | Freq: Four times a day (QID) | ORAL | 1 refills | Status: DC

## 2019-12-01 MED FILL — METHOCARBAMOL 500 MG TABS: 500 | 7 days supply | Qty: 30 | Fill #0

## 2019-12-01 MED FILL — predniSONE 10 MG TABS: 10 | 12 days supply | Qty: 20 | Fill #0

## 2019-12-01 MED FILL — FLUOXETINE HCL 20 MG TABS: 20 | 90 days supply | Qty: 90 | Fill #0

## 2019-12-01 NOTE — Progress Notes (Signed)
Virtual Visit via Video Note  I connected with Michelle Pratt on 12/01/19 at  8:20 AM EDT by a video enabled telemedicine application and verified that I am speaking with the correct person using two identifiers.  Location: Patient: home alone  Provider: home    I discussed the limitations of evaluation and management by telemedicine and the availability of in person appointments. The patient expressed understanding and agreed to proceed.  History of Present Illness: Pt  'tweaked" her back on Sun am.  It progressively got worse and she is taking otc with no relief --- pt is on R side and goes across back and has spasms.  She had robaxin as well.   In the beginning it radiated down to her butt but that is better Pt also would like to increase the dose of her prozac-- we had tried to lower it to 10 mg but she is not liking that and would like to go back up.  She is also c/o ganglion cyst on L wrist -- it has recently gotten bigger .  Dr Amanda Pea told her to wait until it really bothered her to do anything about it --- it is now really starting to hurt and she is unable to do her yoga  Observations/Objective: There were no vitals filed for this visit.  Pt able to walk on her heels and toes.  She is able to bend over and touch her toes  Legs do not feel week  Psych-- she is not suicidal / homicidal,  Just feeling more anxious/ depressed  Assessment and Plan: 1. Lumbar back pain with radiculopathy affecting right lower extremity Muscle relaxer and pred taper  F/u 1-2 weeks if no better or sooner prn  - methocarbamol (ROBAXIN) 500 MG tablet; Take 1 tablet (500 mg total) by mouth 4 (four) times daily.  Dispense: 30 tablet; Refill: 1 - predniSONE (DELTASONE) 10 MG tablet; TAKE 3 TABLETS PO QD FOR 3 DAYS THEN TAKE 2 TABLETS PO QD FOR 3 DAYS THEN TAKE 1 TABLET PO QD FOR 3 DAYS THEN TAKE 1/2 TAB PO QD FOR 3 DAYS  Dispense: 20 tablet; Refill: 0  2. Depression with anxiety Inc prozac to 20 mg daily and  f/u 1-2 months - FLUoxetine (PROZAC) 20 MG tablet; Take 1 tablet (20 mg total) by mouth daily.  Dispense: 90 tablet; Refill: 3  3. Ganglion cyst of wrist, left Refer to hand surgeon - Ambulatory referral to Orthopedic Surgery   Follow Up Instructions:    I discussed the assessment and treatment plan with the patient. The patient was provided an opportunity to ask questions and all were answered. The patient agreed with the plan and demonstrated an understanding of the instructions.   The patient was advised to call back or seek an in-person evaluation if the symptoms worsen or if the condition fails to improve as anticipated.  I provided 30 minutes of non-face-to-face time during this encounter.   Donato Schultz, DO

## 2019-12-10 ENCOUNTER — Encounter: Payer: Self-pay | Admitting: Family Medicine

## 2019-12-10 ENCOUNTER — Telehealth: Payer: 59 | Admitting: Physician Assistant

## 2019-12-10 DIAGNOSIS — L0291 Cutaneous abscess, unspecified: Secondary | ICD-10-CM

## 2019-12-10 DIAGNOSIS — T63421A Toxic effect of venom of ants, accidental (unintentional), initial encounter: Secondary | ICD-10-CM | POA: Diagnosis not present

## 2019-12-10 DIAGNOSIS — L03119 Cellulitis of unspecified part of limb: Secondary | ICD-10-CM

## 2019-12-10 MED ORDER — SULFAMETHOXAZOLE-TRIMETHOPRIM 800-160 MG PO TABS: 1.0000 | ORAL_TABLET | Freq: Two times a day (BID) | ORAL | 0 refills | Status: AC

## 2019-12-10 MED FILL — SULFAMETHOXAZOLE-TMP DS TAB: 800-160 | 7 days supply | Qty: 14 | Fill #0

## 2019-12-10 NOTE — Progress Notes (Signed)
E Visit for Cellulitis  We are sorry that you are not feeling well. Here is how we plan to help!  Based on what you shared with me it looks like you have cellulitis.  Cellulitis looks like areas of skin redness, swelling, and warmth; it develops as a result of bacteria entering under the skin. Little red spots and/or bleeding can be seen in skin, and tiny surface sacs containing fluid can occur. Fever can be present. Cellulitis is almost always on one side of a body, and the lower limbs are the most common site of involvement.   I have prescribed:  Bactrim DS 1 tablet by mouth twice a day for 7 days  If you are not improving after 48 hours of starting the antibiotic, please present to one of our Urgent Care locations for evaluation.  HOME CARE:  . Take your medications as ordered and take all of them, even if the skin irritation appears to be healing.   GET HELP RIGHT AWAY IF:  . Symptoms that don't begin to go away within 48 hours. . Severe redness persists or worsens . If the area turns color, spreads or swells. . If it blisters and opens, develops yellow-brown crust or bleeds. . You develop a fever or chills. . If the pain increases or becomes unbearable.  . Are unable to keep fluids and food down.  MAKE SURE YOU    Understand these instructions.  Will watch your condition.  Will get help right away if you are not doing well or get worse.  Thank you for choosing an e-visit. Your e-visit answers were reviewed by a board certified advanced clinical practitioner to complete your personal care plan. Depending upon the condition, your plan could have included both over the counter or prescription medications. Please review your pharmacy choice. Make sure the pharmacy is open so you can pick up prescription now. If there is a problem, you may contact your provider through [employer] and have the prescription routed to another pharmacy. Your safety is important to [place]. If you  have drug allergies check your prescription carefully.  For the next 24 hours you can use MyChart to ask questions about today's visit, request a non-urgent call back, or ask for a work or school excuse. You will get an email in the next two days asking about your experience. I hope that your e-visit has been valuable and will speed your recovery.  Greater than 5 minutes, yet less than 10 minutes of time have been spent researching, coordinating and implementing care for this patient today.

## 2019-12-10 NOTE — Telephone Encounter (Signed)
Ov-- at least virtual

## 2019-12-13 ENCOUNTER — Other Ambulatory Visit: Payer: Self-pay | Admitting: Family Medicine

## 2019-12-13 MED FILL — LEVOTHYROXINE 125 MCG TABLE: 125 | 90 days supply | Qty: 90 | Fill #0

## 2020-01-09 DIAGNOSIS — Z20822 Contact with and (suspected) exposure to covid-19: Secondary | ICD-10-CM | POA: Diagnosis not present

## 2020-01-10 DIAGNOSIS — M189 Osteoarthritis of first carpometacarpal joint, unspecified: Secondary | ICD-10-CM | POA: Diagnosis not present

## 2020-01-10 DIAGNOSIS — M25532 Pain in left wrist: Secondary | ICD-10-CM | POA: Diagnosis not present

## 2020-01-10 DIAGNOSIS — M67432 Ganglion, left wrist: Secondary | ICD-10-CM | POA: Diagnosis not present

## 2020-01-10 DIAGNOSIS — M1812 Unilateral primary osteoarthritis of first carpometacarpal joint, left hand: Secondary | ICD-10-CM | POA: Diagnosis not present

## 2020-03-17 ENCOUNTER — Other Ambulatory Visit: Payer: Self-pay | Admitting: Family Medicine

## 2020-03-17 MED FILL — LEVOTHYROXINE 125 MCG TABLE: 125 | 90 days supply | Qty: 90 | Fill #0

## 2020-03-24 ENCOUNTER — Encounter: Payer: Self-pay | Admitting: Family Medicine

## 2020-03-24 NOTE — Telephone Encounter (Signed)
Can she do a virtual today? Any way Efraim Kaufmann can do virtual with her pt and I can see mine?

## 2020-03-26 ENCOUNTER — Other Ambulatory Visit (HOSPITAL_COMMUNITY): Payer: Self-pay

## 2020-03-26 DIAGNOSIS — J209 Acute bronchitis, unspecified: Secondary | ICD-10-CM | POA: Diagnosis not present

## 2020-03-26 DIAGNOSIS — Z20822 Contact with and (suspected) exposure to covid-19: Secondary | ICD-10-CM | POA: Diagnosis not present

## 2020-03-26 MED FILL — FLUOXETINE HCL 20 MG TABS: 20 | 90 days supply | Qty: 90 | Fill #1

## 2020-03-27 MED FILL — MUPIROCIN 2% OINTMENT: 2 | 10 days supply | Qty: 22 | Fill #0

## 2020-07-03 MED FILL — LEVOTHYROXINE 125 MCG TAB: 125 | 90 days supply | Qty: 90 | Fill #1

## 2020-07-04 MED FILL — FLUOXETINE HCL 20 MG TABS: 20 | 90 days supply | Qty: 90 | Fill #2

## 2020-10-10 ENCOUNTER — Other Ambulatory Visit (HOSPITAL_COMMUNITY): Payer: Self-pay

## 2020-10-10 MED FILL — Levothyroxine Sodium Tab 125 MCG: ORAL | 90 days supply | Qty: 90 | Fill #0 | Status: AC

## 2020-10-20 MED FILL — Fluoxetine HCl Tab 20 MG: ORAL | 90 days supply | Qty: 90 | Fill #0 | Status: AC

## 2020-10-21 ENCOUNTER — Other Ambulatory Visit (HOSPITAL_COMMUNITY): Payer: Self-pay

## 2020-10-30 ENCOUNTER — Other Ambulatory Visit (HOSPITAL_COMMUNITY): Payer: Self-pay

## 2020-11-10 ENCOUNTER — Other Ambulatory Visit (HOSPITAL_COMMUNITY): Payer: Self-pay

## 2020-12-03 ENCOUNTER — Encounter: Payer: Self-pay | Admitting: Gastroenterology

## 2021-01-09 ENCOUNTER — Other Ambulatory Visit (HOSPITAL_COMMUNITY): Payer: Self-pay

## 2021-01-09 ENCOUNTER — Other Ambulatory Visit: Payer: Self-pay | Admitting: Family Medicine

## 2021-01-11 ENCOUNTER — Other Ambulatory Visit (HOSPITAL_COMMUNITY): Payer: Self-pay

## 2021-01-11 ENCOUNTER — Other Ambulatory Visit: Payer: Self-pay | Admitting: Family Medicine

## 2021-01-12 ENCOUNTER — Other Ambulatory Visit (HOSPITAL_COMMUNITY): Payer: Self-pay

## 2021-01-18 ENCOUNTER — Other Ambulatory Visit: Payer: Self-pay

## 2021-01-18 ENCOUNTER — Other Ambulatory Visit (HOSPITAL_COMMUNITY): Payer: Self-pay

## 2021-01-18 MED ORDER — LEVOTHYROXINE SODIUM 125 MCG PO TABS
ORAL_TABLET | Freq: Every day | ORAL | 0 refills | Status: DC
  Filled 2021-01-18: qty 30, 30d supply, fill #0

## 2021-01-23 ENCOUNTER — Ambulatory Visit: Payer: 59 | Admitting: Family Medicine

## 2021-01-27 ENCOUNTER — Other Ambulatory Visit: Payer: Self-pay | Admitting: Family Medicine

## 2021-01-27 DIAGNOSIS — F418 Other specified anxiety disorders: Secondary | ICD-10-CM

## 2021-01-29 ENCOUNTER — Other Ambulatory Visit (HOSPITAL_COMMUNITY): Payer: Self-pay

## 2021-01-29 MED ORDER — FLUOXETINE HCL 20 MG PO TABS
ORAL_TABLET | Freq: Every day | ORAL | 0 refills | Status: DC
  Filled 2021-01-29: qty 30, 30d supply, fill #0

## 2021-01-30 ENCOUNTER — Telehealth: Payer: Self-pay | Admitting: Family Medicine

## 2021-01-30 DIAGNOSIS — F418 Other specified anxiety disorders: Secondary | ICD-10-CM

## 2021-01-30 NOTE — Telephone Encounter (Signed)
Pt. Called in and stated that she thought her appt was 9/20 but it was really 9/13. Pt called back and rescheduled apt. She wants to know will a refill be sent in for her medication until she is able to see the dr. Next Tuesday.  FLUoxetine (PROZAC) 20 MG tablet

## 2021-01-31 ENCOUNTER — Other Ambulatory Visit (HOSPITAL_COMMUNITY): Payer: Self-pay

## 2021-01-31 MED ORDER — FLUOXETINE HCL 20 MG PO TABS
ORAL_TABLET | Freq: Every day | ORAL | 0 refills | Status: DC
  Filled 2021-01-31 – 2021-02-01 (×2): qty 14, 14d supply, fill #0

## 2021-01-31 NOTE — Telephone Encounter (Signed)
Refill sent.

## 2021-02-01 ENCOUNTER — Other Ambulatory Visit (HOSPITAL_COMMUNITY): Payer: Self-pay

## 2021-02-06 ENCOUNTER — Other Ambulatory Visit (HOSPITAL_COMMUNITY): Payer: Self-pay

## 2021-02-06 ENCOUNTER — Ambulatory Visit (INDEPENDENT_AMBULATORY_CARE_PROVIDER_SITE_OTHER): Payer: 59 | Admitting: Family Medicine

## 2021-02-06 ENCOUNTER — Encounter: Payer: Self-pay | Admitting: Family Medicine

## 2021-02-06 ENCOUNTER — Other Ambulatory Visit: Payer: Self-pay

## 2021-02-06 VITALS — BP 119/51 | HR 51 | Temp 98.1°F | Resp 12 | Ht 70.0 in | Wt 209.4 lb

## 2021-02-06 DIAGNOSIS — F321 Major depressive disorder, single episode, moderate: Secondary | ICD-10-CM | POA: Diagnosis not present

## 2021-02-06 DIAGNOSIS — F418 Other specified anxiety disorders: Secondary | ICD-10-CM | POA: Diagnosis not present

## 2021-02-06 DIAGNOSIS — Z23 Encounter for immunization: Secondary | ICD-10-CM | POA: Diagnosis not present

## 2021-02-06 DIAGNOSIS — E038 Other specified hypothyroidism: Secondary | ICD-10-CM

## 2021-02-06 DIAGNOSIS — E039 Hypothyroidism, unspecified: Secondary | ICD-10-CM

## 2021-02-06 MED ORDER — FLUOXETINE HCL 20 MG PO TABS
ORAL_TABLET | Freq: Every day | ORAL | 3 refills | Status: DC
  Filled 2021-02-06 – 2021-02-19 (×2): qty 90, 90d supply, fill #0
  Filled 2021-06-03: qty 90, 90d supply, fill #1
  Filled 2021-09-10: qty 90, 90d supply, fill #2
  Filled 2021-12-14: qty 90, 90d supply, fill #3

## 2021-02-06 MED ORDER — LEVOTHYROXINE SODIUM 125 MCG PO TABS
ORAL_TABLET | Freq: Every day | ORAL | 3 refills | Status: DC
  Filled 2021-02-06: qty 90, fill #0
  Filled 2021-02-19: qty 90, 90d supply, fill #0
  Filled 2021-05-23: qty 90, 90d supply, fill #1
  Filled 2021-08-26: qty 90, 90d supply, fill #2
  Filled 2021-12-05: qty 90, 90d supply, fill #3

## 2021-02-06 NOTE — Assessment & Plan Note (Signed)
con't synthroid  Check labs today 

## 2021-02-06 NOTE — Assessment & Plan Note (Signed)
Stable--- con't prozac   

## 2021-02-06 NOTE — Progress Notes (Signed)
Established Patient Office Visit  Subjective:  Patient ID: Michelle Pratt, female    DOB: 23-Oct-1955  Age: 65 y.o. MRN: [id number]  CC:  Chief Complaint  Patient presents with   Medication Refill    Synthroid, Prozac     HPI Michelle Pratt presents for f/u thyroid and prozac    no complaints   Past Medical History:  Diagnosis Date   Depression    Glaucoma    Joint pain    Menopause    Thyroid disease    hypo   Vitamin D deficiency     Past Surgical History:  Procedure Laterality Date   foot Left 2019   dr Ardelle Anton-- [facility] ----  first metarsal replacement L foot    oraf     left eyebrow   ORIF ELBOW FRACTURE     TONSILLECTOMY     TUBAL LIGATION      Family History  Problem Relation Age of Onset   Alcohol abuse Mother    Parkinsonism Mother    Hypertension Father    Cancer Father 73       prostate   Hyperlipidemia Father    Stroke Brother     Social History   Socioeconomic History   Marital status: Married    Spouse name: [name]   Number of children: 3   Years of education: Not on file   Highest education level: Not on file  Occupational History   Occupation: Teacher, 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[employer]    Comment: pre surgical  Tobacco Use   Smoking status: Former    Packs/day: 0.75    Years: 18.00    Pack years: 13.50    Types: Cigarettes    Quit date: 08/15/1992    Years since quitting: 28.4   Smokeless tobacco: Never   Tobacco comments:    over 20 years ago  Vaping Use   Vaping Use: Never used  Substance and Sexual Activity   Alcohol use: Yes    Alcohol/week: 3.0 standard drinks    Types: 3 Glasses of wine per week   Drug use: No   Sexual activity: Yes    Partners: Male  Other Topics Concern   Not on file  Social History Narrative   Exercise--some bike riding   Caffeine Use: daily 1 cup of coffee   Social Determinants of Health   Financial Resource Strain: Not on file  Food Insecurity: Not on file  Transportation Needs: Not  on file  Physical Activity: Not on file  Stress: Not on file  Social Connections: Not on file  Intimate Partner Violence: Not on file    Outpatient Medications Prior to Visit  Medication Sig Dispense Refill   acetaminophen (TYLENOL) 325 MG tablet Take 650 mg by mouth every 6 (six) hours as needed.     EPINEPHrine (EPIPEN) 0.3 mg/0.3 mL IJ SOAJ injection Inject 0.3 mLs (0.3 mg total) into the muscle once as needed (allergic reaction). 1 Device 0   Melatonin 3 MG CAPS Take 1 capsule by mouth.     mupirocin ointment (BACTROBAN) 2 % APPLY ON THE SKIN TWICE A DAY AS DIRECTED 22 g 0   ondansetron (ZOFRAN-ODT) 4 MG disintegrating tablet Take 1 tablet (4 mg total) by mouth every 8 (eight) hours as needed for nausea or vomiting. 20 tablet 0   Calcium Carb-Cholecalciferol (CALCIUM 1000 + D PO) Take 1 tablet by mouth daily.     dicyclomine (BENTYL) 10 MG  capsule Take 1 tab every 6 hours as needed for abdominal cramping. 60 capsule 0   FLUoxetine (PROZAC) 20 MG tablet TAKE 1 TABLET BY MOUTH DAILY. (please make appt for refills) 14 tablet 0   levothyroxine (SYNTHROID) 125 MCG tablet TAKE 1 TABLET BY MOUTH ONCE A DAY 30 tablet 0   methocarbamol (ROBAXIN) 500 MG tablet Take 1 tablet (500 mg total) by mouth 4 (four) times daily. 30 tablet 1   predniSONE (DELTASONE) 10 MG tablet TAKE 3 TABLETS PO QD FOR 3 DAYS THEN TAKE 2 TABLETS PO QD FOR 3 DAYS THEN TAKE 1 TABLET PO QD FOR 3 DAYS THEN TAKE 1/2 TAB PO QD FOR 3 DAYS 20 tablet 0   Vitamin D, Ergocalciferol, (DRISDOL) 1.25 MG (50000 UT) CAPS capsule Take 1 capsule (50,000 Units total) by mouth every 7 (seven) days. 4 capsule 0   No facility-administered medications prior to visit.    Allergies  Allergen Reactions   Bee Venom Anaphylaxis and Swelling   Morphine Nausea And Vomiting    ROS Review of Systems  Constitutional:  Negative for activity change, appetite change, chills, fatigue, fever and unexpected weight change.  HENT:  Negative for congestion  and hearing loss.   Eyes:  Negative for discharge.  Respiratory:  Negative for cough and shortness of breath.   Cardiovascular:  Negative for chest pain, palpitations and leg swelling.  Gastrointestinal:  Negative for abdominal pain, blood in stool, constipation, diarrhea, nausea and vomiting.  Genitourinary:  Negative for dysuria, frequency, hematuria and urgency.  Musculoskeletal:  Negative for back pain and myalgias.  Skin:  Negative for rash.  Allergic/Immunologic: Negative for environmental allergies.  Neurological:  Negative for dizziness, weakness and headaches.  Hematological:  Does not bruise/bleed easily.  Psychiatric/Behavioral:  Negative for behavioral problems, dysphoric mood and suicidal ideas. The patient is not nervous/anxious.      Objective:    Physical Exam Vitals and nursing note reviewed.  Constitutional:      Appearance: She is well-developed.  HENT:     Head: Normocephalic and atraumatic.  Eyes:     Conjunctiva/sclera: Conjunctivae normal.  Neck:     Thyroid: No thyromegaly or thyroid tenderness.     Vascular: No carotid bruit or JVD.  Cardiovascular:     Rate and Rhythm: Normal rate and regular rhythm.     Heart sounds: Normal heart sounds. No murmur heard. Pulmonary:     Effort: Pulmonary effort is normal. No respiratory distress.     Breath sounds: Normal breath sounds. No wheezing or rales.  Chest:     Chest wall: No tenderness.  Musculoskeletal:     Cervical back: Normal range of motion and neck supple.  Lymphadenopathy:     Cervical: No cervical adenopathy.  Neurological:     Mental Status: She is alert and oriented to person, place, and time.    BP (!) 119/51 (BP Location: Left Arm, Cuff Size: Normal)   Pulse (!) 51   Temp 98.1 F (36.7 C) (Oral)   Resp 12   Ht 5\' 10"  (1.778 m)   Wt 209 lb 6.4 oz (95 kg)   SpO2 96%   BMI 30.05 kg/m  Wt Readings from Last 3 Encounters:  02/06/21 209 lb 6.4 oz (95 kg)  09/13/19 207 lb 4 oz (94 kg)   08/16/19 209 lb 12.8 oz (95.2 kg)     Health Maintenance Due  Topic Date Due   COVID-19 Vaccine (4 - Booster for Pfizer series) 05/18/2020  MAMMOGRAM  09/06/2020   COLONOSCOPY (Pts 45-27yrs Insurance coverage will need to be confirmed)  11/04/2020    There are no preventive care reminders to display for this patient.  Lab Results  Component Value Date   TSH 0.50 10/19/2019   Lab Results  Component Value Date   WBC 4.7 08/16/2019   HGB 14.3 08/16/2019   HCT 42.6 08/16/2019   MCV 93.0 08/16/2019   PLT 267.0 08/16/2019   Lab Results  Component Value Date   NA 138 08/16/2019   K 4.3 08/16/2019   CO2 29 08/16/2019   GLUCOSE 99 08/16/2019   BUN 21 08/16/2019   CREATININE 0.81 08/16/2019   BILITOT 0.5 08/16/2019   ALKPHOS 57 08/16/2019   AST 20 08/16/2019   ALT 34 08/16/2019   PROT 6.3 08/16/2019   ALBUMIN 4.4 08/16/2019   CALCIUM 9.3 08/16/2019   GFR 71.41 08/16/2019   Lab Results  Component Value Date   CHOL 213 (H) 08/16/2019   Lab Results  Component Value Date   HDL 62.90 08/16/2019   Lab Results  Component Value Date   LDLCALC 129 (H) 08/16/2019   Lab Results  Component Value Date   TRIG 107.0 08/16/2019   Lab Results  Component Value Date   CHOLHDL 3 08/16/2019   Lab Results  Component Value Date   HGBA1C 5.6 07/29/2018      Assessment & Plan:   Problem List Items Addressed This Visit       Unprioritized   Depression, major, single episode, moderate (HCC)    Stable  con't prozac      Relevant Medications   FLUoxetine (PROZAC) 20 MG tablet   Other specified hypothyroidism    con't synthroid Check labs today      Relevant Medications   levothyroxine (SYNTHROID) 125 MCG tablet   Other Visit Diagnoses     Hypothyroidism, unspecified type    -  Primary   Relevant Medications   levothyroxine (SYNTHROID) 125 MCG tablet   Other Relevant Orders   Thyroid Panel With TSH   Depression with anxiety       Relevant Medications    FLUoxetine (PROZAC) 20 MG tablet   Need for shingles vaccine       Relevant Orders   Varicella-zoster vaccine IM (Completed)       Meds ordered this encounter  Medications   FLUoxetine (PROZAC) 20 MG tablet    Sig: TAKE 1 TABLET BY MOUTH DAILY. (please make appt for refills)    Dispense:  90 tablet    Refill:  3    Requested drug refills are authorized, however, the patient needs further evaluation and/or laboratory testing before further refills are given. Ask her to make an appointment for this.   levothyroxine (SYNTHROID) 125 MCG tablet    Sig: TAKE 1 TABLET BY MOUTH ONCE A DAY    Dispense:  90 tablet    Refill:  3    Follow-up: Return if symptoms worsen or fail to improve, for annual exam, fasting.    Donato Schultz, DO

## 2021-02-06 NOTE — Patient Instructions (Addendum)
For colonoscopy call  GI [phone] For mammogram call [facility] [phone]  Hypothyroidism Hypothyroidism is when the thyroid gland does not make enough of certain hormones (it is underactive). The thyroid gland is a small gland located in the lower front part of the neck, just in front of the windpipe (trachea). This gland makes hormones that help control how the body uses food for energy (metabolism) as well as how the heart and brain function. These hormones also play a role in keeping your bones strong. When the thyroid is underactive, it produces too little of the hormones thyroxine (T4) and triiodothyronine (T3). What are the causes? This condition may be caused by: Hashimoto's disease. This is a disease in which the body's disease-fighting system (immune system) attacks the thyroid gland. This is the most common cause. Viral infections. Pregnancy. Certain medicines. Birth defects. Past radiation treatments to the head or neck for cancer. Past treatment with radioactive iodine. Past exposure to radiation in the environment. Past surgical removal of part or all of the thyroid. Problems with a gland in the center of the brain (pituitary gland). Lack of enough iodine in the diet. What increases the risk? You are more likely to develop this condition if: You are female. You have a family history of thyroid conditions. You use a medicine called lithium. You take medicines that affect the immune system (immunosuppressants). What are the signs or symptoms? Symptoms of this condition include: Feeling as though you have no energy (lethargy). Not being able to tolerate cold. Weight gain that is not explained by a change in diet or exercise habits. Lack of appetite. Dry skin. Coarse hair. Menstrual irregularity. Slowing of thought processes. Constipation. Sadness or depression. How is this diagnosed? This condition may be diagnosed based on: Your symptoms, your  medical history, and a physical exam. Blood tests. You may also have imaging tests, such as an ultrasound or MRI. How is this treated? This condition is treated with medicine that replaces the thyroid hormones that your body does not make. After you begin treatment, it may take several weeks for symptoms to go away. Follow these instructions at home: Take over-the-counter and prescription medicines only as told by your health care provider. If you start taking any new medicines, tell your health care provider. Keep all follow-up visits as told by your health care provider. This is important. As your condition improves, your dosage of thyroid hormone medicine may change. You will need to have blood tests regularly so that your health care provider can monitor your condition. Contact a health care provider if: Your symptoms do not get better with treatment. You are taking thyroid hormone replacement medicine and you: Sweat a lot. Have tremors. Feel anxious. Lose weight rapidly. Cannot tolerate heat. Have emotional swings. Have diarrhea. Feel weak. Get help right away if you have: Chest pain. An irregular heartbeat. A rapid heartbeat. Difficulty breathing. Summary Hypothyroidism is when the thyroid gland does not make enough of certain hormones (it is underactive). When the thyroid is underactive, it produces too little of the hormones thyroxine (T4) and triiodothyronine (T3). The most common cause is Hashimoto's disease, a disease in which the body's disease-fighting system (immune system) attacks the thyroid gland. The condition can also be caused by viral infections, medicine, pregnancy, or past radiation treatment to the head or neck. Symptoms may include weight gain, dry skin, constipation, feeling as though you do not have energy, and not being able to tolerate cold. This condition is treated with medicine  to replace the thyroid hormones that your body does not make. This  information is not intended to replace advice given to you by your health care provider. Make sure you discuss any questions you have with your health care provider. Document Revised: 01/28/2020 Document Reviewed: 01/13/2020 Elsevier Patient Education  2022 [employer].

## 2021-02-07 LAB — THYROID PANEL WITH TSH
Free Thyroxine Index: 2.4 (ref 1.4–3.8)
T3 Uptake: 30 % (ref 22–35)
T4, Total: 8 ug/dL (ref 5.1–11.9)
TSH: 3.82 mIU/L (ref 0.40–4.50)

## 2021-02-13 ENCOUNTER — Encounter: Payer: Self-pay | Admitting: Gastroenterology

## 2021-02-13 ENCOUNTER — Other Ambulatory Visit (HOSPITAL_COMMUNITY): Payer: Self-pay

## 2021-02-19 ENCOUNTER — Other Ambulatory Visit (HOSPITAL_COMMUNITY): Payer: Self-pay

## 2021-03-22 ENCOUNTER — Other Ambulatory Visit (HOSPITAL_COMMUNITY): Payer: Self-pay

## 2021-03-22 ENCOUNTER — Encounter: Payer: Self-pay | Admitting: Gastroenterology

## 2021-03-22 ENCOUNTER — Other Ambulatory Visit: Payer: Self-pay

## 2021-03-22 ENCOUNTER — Ambulatory Visit (AMBULATORY_SURGERY_CENTER): Payer: 59 | Admitting: *Deleted

## 2021-03-22 VITALS — Ht 70.0 in | Wt 209.0 lb

## 2021-03-22 DIAGNOSIS — Z1211 Encounter for screening for malignant neoplasm of colon: Secondary | ICD-10-CM

## 2021-03-22 MED ORDER — NA SULFATE-K SULFATE-MG SULF 17.5-3.13-1.6 GM/177ML PO SOLN
1.0000 | ORAL | 0 refills | Status: DC
  Filled 2021-03-22: qty 354, 1d supply, fill #0

## 2021-03-22 NOTE — Progress Notes (Signed)

## 2021-04-10 ENCOUNTER — Ambulatory Visit (AMBULATORY_SURGERY_CENTER): Payer: 59 | Admitting: Gastroenterology

## 2021-04-10 ENCOUNTER — Encounter: Payer: Self-pay | Admitting: Gastroenterology

## 2021-04-10 VITALS — BP 134/56 | HR 49 | Temp 97.3°F | Resp 14 | Ht 70.0 in | Wt 209.0 lb

## 2021-04-10 DIAGNOSIS — Z1211 Encounter for screening for malignant neoplasm of colon: Secondary | ICD-10-CM | POA: Diagnosis not present

## 2021-04-10 MED ORDER — SODIUM CHLORIDE 0.9 % IV SOLN: 500.0000 mL | Freq: Once | INTRAVENOUS | Status: DC

## 2021-04-10 NOTE — Op Note (Signed)
[facility] Patient Name: Michelle Pratt Procedure Date: 04/10/2021 10:50 AM MRN: [id number] Endoscopist: Rachael Fee , MD Age: 65 Referring MD:  Date of Birth: [birth year] Gender: Female Account #: [id number] Procedure:                Colonoscopy Indications:              Screening for colorectal malignant neoplasm Medicines:                Monitored Anesthesia Care Procedure:                Pre-Anesthesia Assessment:                           - Prior to the procedure, a History and Physical                            was performed, and patient medications and                            allergies were reviewed. The patient's tolerance of                            previous anesthesia was also reviewed. The risks                            and benefits of the procedure and the sedation                            options and risks were discussed with the patient.                            All questions were answered, and informed consent                            was obtained. Prior Anticoagulants: The patient has                            taken no previous anticoagulant or antiplatelet                            agents. ASA Grade Assessment: II - A patient with                            mild systemic disease. After reviewing the risks                            and benefits, the patient was deemed in                            satisfactory condition to undergo the procedure.                           After obtaining informed consent, the colonoscope  was passed under direct vision. Throughout the                            procedure, the patient's blood pressure, pulse, and                            oxygen saturations were monitored continuously. The                            CF [id number] #[id number] was introduced through the anus                            and advanced to the the cecum, identified by                            appendiceal  orifice and ileocecal valve. The                            colonoscopy was performed without difficulty. The                            patient tolerated the procedure well. The quality                            of the bowel preparation was good. The ileocecal                            valve, appendiceal orifice, and rectum were                            photographed. Scope In: 10:56:22 AM Scope Out: 11:06:55 AM Scope Withdrawal Time: 0 hours 7 minutes 3 seconds  Total Procedure Duration: 0 hours 10 minutes 33 seconds  Findings:                 The entire examined colon appeared normal on direct                            and retroflexion views. Complications:            No immediate complications. Estimated blood loss:                            None. Estimated Blood Loss:     Estimated blood loss: none. Impression:               - The entire examined colon is normal on direct and                            retroflexion views.                           - No polyps or cancers. Recommendation:           - Patient has a contact number available for  emergencies. The signs and symptoms of potential                            delayed complications were discussed with the                            patient. Return to normal activities tomorrow.                            Written discharge instructions were provided to the                            patient.                           - Resume previous diet.                           - Continue present medications.                           - Repeat colonoscopy in 10 years for screening. Rachael Fee, MD 04/10/2021 11:09:13 AM This report has been signed electronically.

## 2021-04-10 NOTE — Progress Notes (Signed)
No problems noted in the recovery room. maw 

## 2021-04-10 NOTE — Patient Instructions (Signed)
You may resume your current medications today. Repeat colonoscopy 10 10 years for screening purposes. Please call if any questions or concerns.      YOU HAD AN ENDOSCOPIC PROCEDURE TODAY AT THE [facility]:   Refer to the procedure report that was given to you for any specific questions about what was found during the examination.  If the procedure report does not answer your questions, please call your gastroenterologist to clarify.  If you requested that your care partner not be given the details of your procedure findings, then the procedure report has been included in a sealed envelope for you to review at your convenience later.  YOU SHOULD EXPECT: Some feelings of bloating in the abdomen. Passage of more gas than usual.  Walking can help get rid of the air that was put into your GI tract during the procedure and reduce the bloating. If you had a lower endoscopy (such as a colonoscopy or flexible sigmoidoscopy) you may notice spotting of blood in your stool or on the toilet paper. If you underwent a bowel prep for your procedure, you may not have a normal bowel movement for a few days.  Please Note:  You might notice some irritation and congestion in your nose or some drainage.  This is from the oxygen used during your procedure.  There is no need for concern and it should clear up in a day or so.  SYMPTOMS TO REPORT IMMEDIATELY:  Following lower endoscopy (colonoscopy or flexible sigmoidoscopy):  Excessive amounts of blood in the stool  Significant tenderness or worsening of abdominal pains  Swelling of the abdomen that is new, acute  Fever of 100F or higher   For urgent or emergent issues, a gastroenterologist can be reached at any hour by calling [phone]. Do not use MyChart messaging for urgent concerns.    DIET:  We do recommend a small meal at first, but then you may proceed to your regular diet.  Drink plenty of fluids but you should avoid alcoholic  beverages for 24 hours.  ACTIVITY:  You should plan to take it easy for the rest of today and you should NOT DRIVE or use heavy machinery until tomorrow (because of the sedation medicines used during the test).    FOLLOW UP: Our staff will call the number listed on your records 48-72 hours following your procedure to check on you and address any questions or concerns that you may have regarding the information given to you following your procedure. If we do not reach you, we will leave a message.  We will attempt to reach you two times.  During this call, we will ask if you have developed any symptoms of COVID 19. If you develop any symptoms (ie: fever, flu-like symptoms, shortness of breath, cough etc.) before then, please call [phone].  If you test positive for Covid 19 in the 2 weeks post procedure, please call and report this information to [place].    If any biopsies were taken you will be contacted by phone or by letter within the next 1-3 weeks.  Please call us at [phone] if you have not heard about the biopsies in 3 weeks.    SIGNATURES/CONFIDENTIALITY: You and/or your care partner have signed paperwork which will be entered into your electronic medical record.  These signatures attest to the fact that that the information above on your After Visit Summary has been reviewed and is understood.  Full responsibility of the confidentiality of this  discharge information lies with you and/or your care-partner.  

## 2021-04-10 NOTE — Progress Notes (Signed)
A and O x3. Report to RN. Tolerated MAC anesthesia well. 

## 2021-04-10 NOTE — Progress Notes (Signed)
Pt's states no medical or surgical changes since previsit or office visit.   Check-in-AM  V/S-CW 

## 2021-04-10 NOTE — Progress Notes (Signed)
HPI: This is a woman at routine risk for CRC   ROS: complete GI ROS as described in HPI, all other review negative.  Constitutional:  No unintentional weight loss   Past Medical History:  Diagnosis Date   Depression    Glaucoma    Joint pain    Menopause    Thyroid disease    hypo   Vitamin D deficiency     Past Surgical History:  Procedure Laterality Date   COLONOSCOPY  05/30/2010   Dr.Perrie Ragin   foot Left 2019   dr Jacqualyn Posey-- [facility] ----  first metarsal replacement L foot    oraf     left eyebrow   ORIF ELBOW FRACTURE     TONSILLECTOMY     TUBAL LIGATION      Current Outpatient Medications  Medication Sig Dispense Refill   acetaminophen (TYLENOL) 325 MG tablet Take 650 mg by mouth every 6 (six) hours as needed.     EPINEPHrine (EPIPEN) 0.3 mg/0.3 mL IJ SOAJ injection Inject 0.3 mLs (0.3 mg total) into the muscle once as needed (allergic reaction). 1 Device 0   FLUoxetine (PROZAC) 20 MG tablet TAKE 1 TABLET BY MOUTH DAILY. (please make appt for refills) 90 tablet 3   levothyroxine (SYNTHROID) 125 MCG tablet TAKE 1 TABLET BY MOUTH ONCE A DAY 90 tablet 3   Melatonin 3 MG CAPS Take 1 capsule by mouth.     Multiple Vitamin (MULTIVITAMIN) tablet Take 1 tablet by mouth daily.     Na Sulfate-K Sulfate-Mg Sulf 17.5-3.13-1.6 GM/177ML SOLN Take 1 kit by mouth as directed. 354 mL 0   Current Facility-Administered Medications  Medication Dose Route Frequency Provider Last Rate Last Admin   0.9 %  sodium chloride infusion  500 mL Intravenous Once Milus Banister, MD        Allergies as of 04/10/2021 - Review Complete 04/10/2021  Allergen Reaction Noted   Bee venom Anaphylaxis and Swelling 01/18/2013   Morphine Nausea And Vomiting 10/20/2009    Family History  Problem Relation Age of Onset   Alcohol abuse Mother    Parkinsonism Mother    Hypertension Father    Cancer Father 24       prostate   Hyperlipidemia Father    Stroke Brother    Colon cancer Neg Hx      Social History   Socioeconomic History   Marital status: Married    Spouse name: [name]   Number of children: 3   Years of education: Not on file   Highest education level: Not on file  Occupational History   Occupation: Programmer, multimedia: [employer]    Comment: pre surgical  Tobacco Use   Smoking status: Former    Packs/day: 0.75    Years: 18.00    Pack years: 13.50    Types: Cigarettes    Quit date: 08/15/1992    Years since quitting: 28.6   Smokeless tobacco: Never   Tobacco comments:    over 20 years ago  Vaping Use   Vaping Use: Never used  Substance and Sexual Activity   Alcohol use: Yes    Alcohol/week: 5.0 - 7.0 standard drinks    Types: 5 - 7 Standard drinks or equivalent per week   Drug use: No   Sexual activity: Yes    Partners: Male  Other Topics Concern   Not on file  Social History Narrative   Exercise--some bike riding   Caffeine Use: daily  1 cup of coffee   Social Determinants of Health   Financial Resource Strain: Not on file  Food Insecurity: Not on file  Transportation Needs: Not on file  Physical Activity: Not on file  Stress: Not on file  Social Connections: Not on file  Intimate Partner Violence: Not on file     Physical Exam: BP (!) 116/44 (Patient Position: Sitting)   Pulse (!) 55   Temp (!) 97.3 F (36.3 C)   Ht '5\' 10"'  (1.778 m)   Wt 209 lb (94.8 kg)   SpO2 96%   BMI 29.99 kg/m  Constitutional: generally well-appearing Psychiatric: alert and oriented x3 Lungs: CTA bilaterally Heart: no MCR  Assessment and plan: 65 y.o. female with routine risk for CRC  Screening colonoscoyp today  Care is appropriate for the ambulatory setting.  Owens Loffler, MD [facility] 04/10/2021, 10:46 AM

## 2021-04-12 ENCOUNTER — Telehealth: Payer: Self-pay

## 2021-04-12 NOTE — Telephone Encounter (Signed)
  Follow up Call-  Call back number 04/10/2021  Post procedure Call Back phone  # [phone]  Permission to leave phone message Yes  Some recent data might be hidden     Patient questions:  Do you have a fever, pain , or abdominal swelling? No. Pain Score  0 *  Have you tolerated food without any problems? Yes.    Have you been able to return to your normal activities? Yes.    Do you have any questions about your discharge instructions: Diet   No. Medications  No. Follow up visit  No.  Do you have questions or concerns about your Care? No.  Actions: * If pain score is 4 or above: No action needed, pain <4.

## 2021-04-17 ENCOUNTER — Other Ambulatory Visit (HOSPITAL_COMMUNITY): Payer: Self-pay

## 2021-05-23 ENCOUNTER — Other Ambulatory Visit (HOSPITAL_COMMUNITY): Payer: Self-pay

## 2021-05-24 ENCOUNTER — Other Ambulatory Visit (HOSPITAL_COMMUNITY): Payer: Self-pay

## 2021-05-24 MED ORDER — CARESTART COVID-19 HOME TEST VI KIT
PACK | 1 refills | Status: DC
  Filled 2021-05-24: qty 4, 4d supply, fill #0

## 2021-06-04 ENCOUNTER — Other Ambulatory Visit (HOSPITAL_COMMUNITY): Payer: Self-pay

## 2021-07-10 ENCOUNTER — Telehealth (INDEPENDENT_AMBULATORY_CARE_PROVIDER_SITE_OTHER): Payer: 59 | Admitting: Family Medicine

## 2021-07-10 ENCOUNTER — Encounter: Payer: Self-pay | Admitting: Family Medicine

## 2021-07-10 DIAGNOSIS — U071 COVID-19: Secondary | ICD-10-CM | POA: Insufficient documentation

## 2021-07-10 HISTORY — DX: COVID-19: U07.1

## 2021-07-10 MED ORDER — MOLNUPIRAVIR EUA 200MG CAPSULE: 4.0000 | ORAL_CAPSULE | Freq: Two times a day (BID) | ORAL | 0 refills | Status: AC

## 2021-07-10 MED ORDER — PROMETHAZINE-DM 6.25-15 MG/5ML PO SYRP: 5.0000 mL | ORAL_SOLUTION | Freq: Four times a day (QID) | ORAL | 0 refills | Status: DC | PRN

## 2021-07-10 NOTE — Assessment & Plan Note (Signed)
Antiviral per orders Quarantine 5 days and then can leave the house with mask for 5 more days as long as symptoms are improving  Call back if she feels like she can not go back to work [employer]

## 2021-07-10 NOTE — Patient Instructions (Signed)
COVID-19: Quarantine and Isolation °Quarantine °If you were exposed °Quarantine and stay away from others when you have been in close contact with someone who has COVID-19. °Isolate °If you are sick or test positive °Isolate when you are sick or when you have COVID-19, even if you don't have symptoms. °When to stay home °Calculating quarantine °The date of your exposure is considered day 0. Day 1 is the first full day after your last contact with a person who has had COVID-19. Stay home and away from other people for at least 5 days. Learn why CDC updated guidance for the general public. °IF YOU were exposed to COVID-19 and are NOT  °up to dateIF YOU were exposed to COVID-19 and are NOT on COVID-19 vaccinations °Quarantine for at least 5 days °Stay home °Stay home and quarantine for at least 5 full days. °Wear a well-fitting mask if you must be around others in your home. °Do not travel. °Get tested °Even if you don't develop symptoms, get tested at least 5 days after you last had close contact with someone with COVID-19. °After quarantine °Watch for symptoms °Watch for symptoms until 10 days after you last had close contact with someone with COVID-19. °Avoid travel °It is best to avoid travel until a full 10 days after you last had close contact with someone with COVID-19. °If you develop symptoms °Isolate immediately and get tested. Continue to stay home until you know the results. Wear a well-fitting mask around others. °Take precautions until day 10 °Wear a well-fitting mask °Wear a well-fitting mask for 10 full days any time you are around others inside your home or in public. Do not go to places where you are unable to wear a well-fitting mask. °If you must travel during days 6-10, take precautions. °Avoid being around people who are more likely to get very sick from COVID-19. °IF YOU were exposed to COVID-19 and are  °up to dateIF YOU were exposed to COVID-19 and are on COVID-19 vaccinations °No  quarantine °You do not need to stay home unless you develop symptoms. °Get tested °Even if you don't develop symptoms, get tested at least 5 days after you last had close contact with someone with COVID-19. °Watch for symptoms °Watch for symptoms until 10 days after you last had close contact with someone with COVID-19. °If you develop symptoms °Isolate immediately and get tested. Continue to stay home until you know the results. Wear a well-fitting mask around others. °Take precautions until day 10 °Wear a well-fitting mask °Wear a well-fitting mask for 10 full days any time you are around others inside your home or in public. Do not go to places where you are unable to wear a well-fitting mask. °Take precautions if traveling °Avoid being around people who are more likely to get very sick from COVID-19. °IF YOU were exposed to COVID-19 and had confirmed COVID-19 within the past 90 days (you tested positive using a viral test) °No quarantine °You do not need to stay home unless you develop symptoms. °Watch for symptoms °Watch for symptoms until 10 days after you last had close contact with someone with COVID-19. °If you develop symptoms °Isolate immediately and get tested. Continue to stay home until you know the results. Wear a well-fitting mask around others. °Take precautions until day 10 °Wear a well-fitting mask °Wear a well-fitting mask for 10 full days any time you are around others inside your home or in public. Do not go to places where you are   unable to wear a well-fitting mask. °Take precautions if traveling °Avoid being around people who are more likely to get very sick from COVID-19. °Calculating isolation °Day 0 is your first day of symptoms or a positive viral test. Day 1 is the first full day after your symptoms developed or your test specimen was collected. If you have COVID-19 or have symptoms, isolate for at least 5 days. °IF YOU tested positive for COVID-19 or have symptoms, regardless of  vaccination status °Stay home for at least 5 days °Stay home for 5 days and isolate from others in your home. °Wear a well-fitting mask if you must be around others in your home. °Do not travel. °Ending isolation if you had symptoms °End isolation after 5 full days if you are fever-free for 24 hours (without the use of fever-reducing medication) and your symptoms are improving. °Ending isolation if you did NOT have symptoms °End isolation after at least 5 full days after your positive test. °If you got very sick from COVID-19 or have a weakened immune system °You should isolate for at least 10 days. Consult your doctor before ending isolation. °Take precautions until day 10 °Wear a well-fitting mask °Wear a well-fitting mask for 10 full days any time you are around others inside your home or in public. Do not go to places where you are unable to wear a well-fitting mask. °Do not travel °Do not travel until a full 10 days after your symptoms started or the date your positive test was taken if you had no symptoms. °Avoid being around people who are more likely to get very sick from COVID-19. °Definitions °Exposure °Contact with someone infected with SARS-CoV-2, the virus that causes COVID-19, in a way that increases the likelihood of getting infected with the virus. °Close contact °A close contact is someone who was less than 6 feet away from an infected person (laboratory-confirmed or a clinical diagnosis) for a cumulative total of 15 minutes or more over a 24-hour period. For example, three individual 5-minute exposures for a total of 15 minutes. People who are exposed to someone with COVID-19 after they completed at least 5 days of isolation are not considered close contacts. °Quarantine °Quarantine is a strategy used to prevent transmission of COVID-19 by keeping people who have been in close contact with someone with COVID-19 apart from others. °Who does not need to quarantine? °If you had close contact with  someone with COVID-19 and you are in one of the following groups, you do not need to quarantine. °You are up to date with your COVID-19 vaccines. °You had confirmed COVID-19 within the last 90 days (meaning you tested positive using a viral test). °If you are up to date with COVID-19 vaccines, you should wear a well-fitting mask around others for 10 days from the date of your last close contact with someone with COVID-19 (the date of last close contact is considered day 0). Get tested at least 5 days after you last had close contact with someone with COVID-19. If you test positive or develop COVID-19 symptoms, isolate from other people and follow recommendations in the Isolation section below. If you tested positive for COVID-19 with a viral test within the previous 90 days and subsequently recovered and remain without COVID-19 symptoms, you do not need to quarantine or get tested after close contact. You should wear a well-fitting mask around others for 10 days from the date of your last close contact with someone with COVID-19 (the date of last   close contact is considered day 0). If you have COVID-19 symptoms, get tested and isolate from other people and follow recommendations in the Isolation section below. °Who should quarantine? °If you come into close contact with someone with COVID-19, you should quarantine if you are not up to date on COVID-19 vaccines. This includes people who are not vaccinated. °What to do for quarantine °Stay home and away from other people for at least 5 days (day 0 through day 5) after your last contact with a person who has COVID-19. The date of your exposure is considered day 0. Wear a well-fitting mask when around others at home, if possible. °For 10 days after your last close contact with someone with COVID-19, watch for fever (100.4°F or greater), cough, shortness of breath, or other COVID-19 symptoms. °If you develop symptoms, get tested immediately and isolate until you receive  your test results. If you test positive, follow isolation recommendations. °If you do not develop symptoms, get tested at least 5 days after you last had close contact with someone with COVID-19. °If you test negative, you can leave your home, but continue to wear a well-fitting mask when around others at home and in public until 10 days after your last close contact with someone with COVID-19. °If you test positive, you should isolate for at least 5 days from the date of your positive test (if you do not have symptoms). If you do develop COVID-19 symptoms, isolate for at least 5 days from the date your symptoms began (the date the symptoms started is day 0). Follow recommendations in the isolation section below. °If you are unable to get a test 5 days after last close contact with someone with COVID-19, you can leave your home after day 5 if you have been without COVID-19 symptoms throughout the 5-day period. Wear a well-fitting mask for 10 days after your date of last close contact when around others at home and in public. °Avoid people who are have weakened immune systems or are more likely to get very sick from COVID-19, and nursing homes and other high-risk settings, until after at least 10 days. °If possible, stay away from people you live with, especially people who are at higher risk for getting very sick from COVID-19, as well as others outside your home throughout the full 10 days after your last close contact with someone with COVID-19. °If you are unable to quarantine, you should wear a well-fitting mask for 10 days when around others at home and in public. °If you are unable to wear a mask when around others, you should continue to quarantine for 10 days. Avoid people who have weakened immune systems or are more likely to get very sick from COVID-19, and nursing homes and other high-risk settings, until after at least 10 days. °See additional information about travel. °Do not go to places where you are  unable to wear a mask, such as restaurants and some gyms, and avoid eating around others at home and at work until after 10 days after your last close contact with someone with COVID-19. °After quarantine °Watch for symptoms until 10 days after your last close contact with someone with COVID-19. °If you have symptoms, isolate immediately and get tested. °Quarantine in high-risk congregate settings °In certain congregate settings that have high risk of secondary transmission (such as correctional and detention facilities, homeless shelters, or cruise ships), CDC recommends a 10-day quarantine for residents, regardless of vaccination and booster status. During periods of critical staffing   shortages, facilities may consider shortening the quarantine period for staff to ensure continuity of operations. Decisions to shorten quarantine in these settings should be made in consultation with state, local, tribal, or territorial health departments and should take into consideration the context and characteristics of the facility. CDC's setting-specific guidance provides additional recommendations for these settings. °Isolation °Isolation is used to separate people with confirmed or suspected COVID-19 from those without COVID-19. People who are in isolation should stay home until it's safe for them to be around others. At home, anyone sick or infected should separate from others, or wear a well-fitting mask when they need to be around others. People in isolation should stay in a specific "sick room" or area and use a separate bathroom if available. Everyone who has presumed or confirmed COVID-19 should stay home and isolate from other people for at least 5 full days (day 0 is the first day of symptoms or the date of the day of the positive viral test for asymptomatic persons). They should wear a mask when around others at home and in public for an additional 5 days. People who are confirmed to have COVID-19 or are showing  symptoms of COVID-19 need to isolate regardless of their vaccination status. This includes: °People who have a positive viral test for COVID-19, regardless of whether or not they have symptoms. °People with symptoms of COVID-19, including people who are awaiting test results or have not been tested. People with symptoms should isolate even if they do not know if they have been in close contact with someone with COVID-19. °What to do for isolation °Monitor your symptoms. If you have an emergency warning sign (including trouble breathing), seek emergency medical care immediately. °Stay in a separate room from other household members, if possible. °Use a separate bathroom, if possible. °Take steps to improve ventilation at home, if possible. °Avoid contact with other members of the household and pets. °Don't share personal household items, like cups, towels, and utensils. °Wear a well-fitting mask when you need to be around other people. °Learn more about what to do if you are sick and how to notify your contacts. °Ending isolation for people who had COVID-19 and had symptoms °If you had COVID-19 and had symptoms, isolate for at least 5 days. To calculate your 5-day isolation period, day 0 is your first day of symptoms. Day 1 is the first full day after your symptoms developed. You can leave isolation after 5 full days. °You can end isolation after 5 full days if you are fever-free for 24 hours without the use of fever-reducing medication and your other symptoms have improved (Loss of taste and smell may persist for weeks or months after recovery and need not delay the end of isolation). °You should continue to wear a well-fitting mask around others at home and in public for 5 additional days (day 6 through day 10) after the end of your 5-day isolation period. If you are unable to wear a mask when around others, you should continue to isolate for a full 10 days. Avoid people who have weakened immune systems or are more  likely to get very sick from COVID-19, and nursing homes and other high-risk settings, until after at least 10 days. °If you continue to have fever or your other symptoms have not improved after 5 days of isolation, you should wait to end your isolation until you are fever-free for 24 hours without the use of fever-reducing medication and your other symptoms have improved.   Continue to wear a well-fitting mask through day 10. Contact your healthcare provider if you have questions. °See additional information about travel. °Do not go to places where you are unable to wear a mask, such as restaurants and some gyms, and avoid eating around others at home and at work until a full 10 days after your first day of symptoms. °If an individual has access to a test and wants to test, the best approach is to use an antigen test1 towards the end of the 5-day isolation period. Collect the test sample only if you are fever-free for 24 hours without the use of fever-reducing medication and your other symptoms have improved (loss of taste and smell may persist for weeks or months after recovery and need not delay the end of isolation). If your test result is positive, you should continue to isolate until day 10. If your test result is negative, you can end isolation, but continue to wear a well-fitting mask around others at home and in public until day 10. Follow additional recommendations for masking and avoiding travel as described above. °1As noted in the labeling for authorized over-the counter antigen tests: Negative results should be treated as presumptive. Negative results do not rule out SARS-CoV-2 infection and should not be used as the sole basis for treatment or patient management decisions, including infection control decisions. To improve results, antigen tests should be used twice over a three-day period with at least 24 hours and no more than 48 hours between tests. °Note that these recommendations on ending isolation  do not apply to people who are moderately ill or very sick from COVID-19 or have weakened immune systems. See section below for recommendations for when to end isolation for these groups. °Ending isolation for people who tested positive for COVID-19 but had no symptoms °If you test positive for COVID-19 and never develop symptoms, isolate for at least 5 days. Day 0 is the day of your positive viral test (based on the date you were tested) and day 1 is the first full day after the specimen was collected for your positive test. You can leave isolation after 5 full days. °If you continue to have no symptoms, you can end isolation after at least 5 days. °You should continue to wear a well-fitting mask around others at home and in public until day 10 (day 6 through day 10). If you are unable to wear a mask when around others, you should continue to isolate for 10 days. Avoid people who have weakened immune systems or are more likely to get very sick from COVID-19, and nursing homes and other high-risk settings, until after at least 10 days. °If you develop symptoms after testing positive, your 5-day isolation period should start over. Day 0 is your first day of symptoms. Follow the recommendations above for ending isolation for people who had COVID-19 and had symptoms. °See additional information about travel. °Do not go to places where you are unable to wear a mask, such as restaurants and some gyms, and avoid eating around others at home and at work until 10 days after the day of your positive test. °If an individual has access to a test and wants to test, the best approach is to use an antigen test1 towards the end of the 5-day isolation period. If your test result is positive, you should continue to isolate until day 10. If your test result is positive, you can also choose to test daily and if your test result   is negative, you can end isolation, but continue to wear a well-fitting mask around others at home and in  public until day 10. Follow additional recommendations for masking and avoiding travel as described above. °1As noted in the labeling for authorized over-the counter antigen tests: Negative results should be treated as presumptive. Negative results do not rule out SARS-CoV-2 infection and should not be used as the sole basis for treatment or patient management decisions, including infection control decisions. To improve results, antigen tests should be used twice over a three-day period with at least 24 hours and no more than 48 hours between tests. °Ending isolation for people who were moderately or very sick from COVID-19 or have a weakened immune system °People who are moderately ill from COVID-19 (experiencing symptoms that affect the lungs like shortness of breath or difficulty breathing) should isolate for 10 days and follow all other isolation precautions. To calculate your 10-day isolation period, day 0 is your first day of symptoms. Day 1 is the first full day after your symptoms developed. If you are unsure if your symptoms are moderate, talk to a healthcare provider for further guidance. °People who are very sick from COVID-19 (this means people who were hospitalized or required intensive care or ventilation support) and people who have weakened immune systems might need to isolate at home longer. They may also require testing with a viral test to determine when they can be around others. CDC recommends an isolation period of at least 10 and up to 20 days for people who were very sick from COVID-19 and for people with weakened immune systems. Consult with your healthcare provider about when you can resume being around other people. If you are unsure if your symptoms are severe or if you have a weakened immune system, talk to a healthcare provider for further guidance. °People who have a weakened immune system should talk to their healthcare provider about the potential for reduced immune responses to  COVID-19 vaccines and the need to continue to follow current prevention measures (including wearing a well-fitting mask and avoiding crowds and poorly ventilated indoor spaces) to protect themselves against COVID-19 until advised otherwise by their healthcare provider. Close contacts of immunocompromised people--including household members--should also be encouraged to receive all recommended COVID-19 vaccine doses to help protect these people. °Isolation in high-risk congregate settings °In certain high-risk congregate settings that have high risk of secondary transmission and where it is not feasible to cohort people (such as correctional and detention facilities, homeless shelters, and cruise ships), CDC recommends a 10-day isolation period for residents. During periods of critical staffing shortages, facilities may consider shortening the isolation period for staff to ensure continuity of operations. Decisions to shorten isolation in these settings should be made in consultation with state, local, tribal, or territorial health departments and should take into consideration the context and characteristics of the facility. CDC's setting-specific guidance provides additional recommendations for these settings. °This CDC guidance is meant to supplement--not replace--any federal, state, local, territorial, or tribal health and safety laws, rules, and regulations. °Recommendations for specific settings °These recommendations do not apply to healthcare professionals. For guidance specific to these settings, see °Healthcare professionals: Interim Guidance for Managing Healthcare Personnel with SARS-CoV-2 Infection or Exposure to SARS-CoV-2 °Patients, residents, and visitors to healthcare settings: Interim Infection Prevention and Control Recommendations for Healthcare Personnel During the Coronavirus Disease 2019 (COVID-19) Pandemic °Additional setting-specific guidance and recommendations are available. °These  recommendations on quarantine and isolation do apply to K[employer] School   settings. Additional guidance is available here: Overview of COVID-19 Quarantine for K-12 Schools °Travelers: Travel information and recommendations °Congregate facilities and other settings: guidance pages for community, work, and school settings °Ongoing COVID-19 exposure FAQs °I live with someone with COVID-19, but I cannot be separated from them. How do we manage quarantine in this situation? °It is very important for people with COVID-19 to remain apart from other people, if possible, even if they are living together. If separation of the person with COVID-19 from others that they live with is not possible, the other people that they live with will have ongoing exposure, meaning they will be repeatedly exposed until that person is no longer able to spread the virus to other people. In this situation, there are precautions you can take to limit the spread of COVID-19: °The person with COVID-19 and everyone they live with should wear a well-fitting mask inside the home. °If possible, one person should care for the person with COVID-19 to limit the number of people who are in close contact with the infected person. °Take steps to protect yourself and others to reduce transmission in the home: °Quarantine if you are not up to date with your COVID-19 vaccines. °Isolate if you are sick or tested positive for COVID-19, even if you don't have symptoms. °Learn more about the public health recommendations for testing, mask use and quarantine of close contacts, like yourself, who have ongoing exposure. These recommendations differ depending on your vaccination status. °What should I do if I have ongoing exposure to COVID-19 from someone I live with? °Recommendations for this situation depend on your vaccination status: °If you are not up to date on COVID-19 vaccines and have ongoing exposure to COVID-19, you should: °Begin quarantine immediately and  continue to quarantine throughout the isolation period of the person with COVID-19. °Continue to quarantine for an additional 5 days starting the day after the end of isolation for the person with COVID-19. °Get tested at least 5 days after the end of isolation of the infected person that lives with them. °If you test negative, you can leave the home but should continue to wear a well-fitting mask when around others at home and in public until 10 days after the end of isolation for the person with COVID-19. °Isolate immediately if you develop symptoms of COVID-19 or test positive. °If you are up to date with COVID-19 vaccines and have ongoing exposure to COVID-19, you should: °Get tested at least 5 days after your first exposure. A person with COVID-19 is considered infectious starting 2 days before they develop symptoms, or 2 days before the date of their positive test if they do not have symptoms. °Get tested again at least 5 days after the end of isolation for the person with COVID-19. °Wear a well-fitting mask when you are around the person with COVID-19, and do this throughout their isolation period. °Wear a well-fitting mask around others for 10 days after the infected person's isolation period ends. °Isolate immediately if you develop symptoms of COVID-19 or test positive. °What should I do if multiple people I live with test positive for COVID-19 at different times? °Recommendations for this situation depend on your vaccination status: °If you are not up to date with your COVID-19 vaccines, you should: °Quarantine throughout the isolation period of any infected person that you live with. °Continue to quarantine until 5 days after the end of isolation date for the most recently infected person that lives with you. For example, if   the last day of isolation of the person most recently infected with COVID-19 was June 30, the new 5-day quarantine period starts on July 1. °Get tested at least 5 days after the end  of isolation for the most recently infected person that lives with you. °Wear a well-fitting mask when you are around any person with COVID-19 while that person is in isolation. °Wear a well-fitting mask when you are around other people until 10 days after your last close contact. °Isolate immediately if you develop symptoms of COVID-19 or test positive. °If you are up to date with your COVID-19 vaccines, you should: °Get tested at least 5 days after your first exposure. A person with COVID-19 is considered infectious starting 2 days before they developed symptoms, or 2 days before the date of their positive test if they do not have symptoms. °Get tested again at least 5 days after the end of isolation for the most recently infected person that lives with you. °Wear a well-fitting mask when you are around any person with COVID-19 while that person is in isolation. °Wear a well-fitting mask around others for 10 days after the end of isolation for the most recently infected person that lives with you. For example, if the last day of isolation for the person most recently infected with COVID-19 was June 30, the new 10-day period to wear a well-fitting mask indoors in public starts on July 1. °Isolate immediately if you develop symptoms of COVID-19 or test positive. °I had COVID-19 and completed isolation. Do I have to quarantine or get tested if someone I live with gets COVID-19 shortly after I completed isolation? °No. If you recently completed isolation and someone that lives with you tests positive for the virus that causes COVID-19 shortly after the end of your isolation period, you do not have to quarantine or get tested as long as you do not develop new symptoms. Once all of the people that live together have completed isolation or quarantine, refer to the guidance below for new exposures to COVID-19. °If you had COVID-19 in the previous 90 days and then came into close contact with someone with COVID-19, you do  not have to quarantine or get tested if you do not have symptoms. But you should: °Wear a well-fitting mask indoors in public for 10 days after your last close contact. °Monitor for COVID-19 symptoms for 10 days from the date of your last close contact. °Isolate immediately and get tested if symptoms develop. °If more than 90 days have passed since your recovery from infection, follow CDC's recommendations for close contacts. These recommendations will differ depending on your vaccination status. °08/09/2020 °Content source: National Center for Immunization and Respiratory Diseases (NCIRD), Division of Viral Diseases °This information is not intended to replace advice given to you by your health care provider. Make sure you discuss any questions you have with your health care provider. °Document Revised: 12/13/2020 Document Reviewed: 12/13/2020 °Elsevier Patient Education © 2022 Elsevier Inc. ° °

## 2021-07-10 NOTE — Progress Notes (Signed)
MyChart Video Visit    Virtual Visit via Video Note   This visit type was conducted due to national recommendations for restrictions regarding the COVID-19 Pandemic (e.g. social distancing) in an effort to limit this patient's exposure and mitigate transmission in our community. This patient is at least at moderate risk for complications without adequate follow up. This format is felt to be most appropriate for this patient at this time. Physical exam was limited by quality of the video and audio technology used for the visit. [name] was able to get the patient set up on a video visit.  Patient location: home  Patient and provider in visit Provider location: Office  I discussed the limitations of evaluation and management by telemedicine and the availability of in person appointments. The patient expressed understanding and agreed to proceed.  Visit Date: 07/10/2021  Today's healthcare provider: Ann Held, DO     Subjective:    Patient ID: Michelle Pratt, female    DOB: Jan 27, 1956, 66 y.o.   MRN: [id number]  Chief Complaint  Patient presents with   Covid Positive    HPI Patient is in today for covid + on Sunday.  Symptoms started Sat night.  She had pnd, sinus congestion and yesterday she had tight chest, body aches  + low grade fever, dry cough.   She still feels rotton but took tylenol sinus and it helped some  Past Medical History:  Diagnosis Date   Depression    Glaucoma    Joint pain    Menopause    Thyroid disease    hypo   Vitamin D deficiency     Past Surgical History:  Procedure Laterality Date   COLONOSCOPY  05/30/2010   Dr.Jacobs   foot Left 2019   dr Jacqualyn Posey-- [facility] ----  first metarsal replacement L foot    oraf     left eyebrow   ORIF ELBOW FRACTURE     TONSILLECTOMY     TUBAL LIGATION      Family History  Problem Relation Age of Onset   Alcohol abuse Mother    Parkinsonism Mother    Hypertension Father    Cancer  Father 77       prostate   Hyperlipidemia Father    Stroke Brother    Colon cancer Neg Hx     Social History   Socioeconomic History   Marital status: Married    Spouse name: [name]   Number of children: 3   Years of education: Not on file   Highest education level: Not on file  Occupational History   Occupation: Programmer, multimedia: [employer]    Comment: pre surgical  Tobacco Use   Smoking status: Former    Packs/day: 0.75    Years: 18.00    Pack years: 13.50    Types: Cigarettes    Quit date: 08/15/1992    Years since quitting: 28.9   Smokeless tobacco: Never   Tobacco comments:    over 20 years ago  Vaping Use   Vaping Use: Never used  Substance and Sexual Activity   Alcohol use: Yes    Alcohol/week: 5.0 - 7.0 standard drinks    Types: 5 - 7 Standard drinks or equivalent per week   Drug use: No   Sexual activity: Yes    Partners: Male  Other Topics Concern   Not on file  Social History Narrative   Exercise--some bike riding  Caffeine Use: daily 1 cup of coffee   Social Determinants of Health   Financial Resource Strain: Not on file  Food Insecurity: Not on file  Transportation Needs: Not on file  Physical Activity: Not on file  Stress: Not on file  Social Connections: Not on file  Intimate Partner Violence: Not on file    Outpatient Medications Prior to Visit  Medication Sig Dispense Refill   acetaminophen (TYLENOL) 325 MG tablet Take 650 mg by mouth every 6 (six) hours as needed.     EPINEPHrine (EPIPEN) 0.3 mg/0.3 mL IJ SOAJ injection Inject 0.3 mLs (0.3 mg total) into the muscle once as needed (allergic reaction). 1 Device 0   FLUoxetine (PROZAC) 20 MG tablet TAKE 1 TABLET BY MOUTH DAILY. (please make appt for refills) 90 tablet 3   levothyroxine (SYNTHROID) 125 MCG tablet TAKE 1 TABLET BY MOUTH ONCE A DAY 90 tablet 3   Melatonin 3 MG CAPS Take 1 capsule by mouth.     Multiple Vitamin (MULTIVITAMIN) tablet Take 1 tablet by mouth daily.      COVID-19 At Home Antigen Test [facility] COVID-19 HOME TEST) KIT Use as directed (Patient not taking: Reported on 07/10/2021) 4 each 1   No facility-administered medications prior to visit.    Allergies  Allergen Reactions   Bee Venom Anaphylaxis and Swelling   Morphine Nausea And Vomiting    Review of Systems  Constitutional:  Positive for chills, fever and malaise/fatigue.  Respiratory:  Positive for cough, sputum production and shortness of breath.       Objective:    Physical Exam Vitals and nursing note reviewed.  Constitutional:      Appearance: She is ill-appearing.  Pulmonary:     Effort: Pulmonary effort is normal.  Neurological:     Mental Status: She is alert.  Psychiatric:        Mood and Affect: Mood normal.    There were no vitals taken for this visit. Wt Readings from Last 3 Encounters:  04/10/21 209 lb (94.8 kg)  03/22/21 209 lb (94.8 kg)  02/06/21 209 lb 6.4 oz (95 kg)    Diabetic Foot Exam - Simple   No data filed    Lab Results  Component Value Date   WBC 4.7 08/16/2019   HGB 14.3 08/16/2019   HCT 42.6 08/16/2019   PLT 267.0 08/16/2019   GLUCOSE 99 08/16/2019   CHOL 213 (H) 08/16/2019   TRIG 107.0 08/16/2019   HDL 62.90 08/16/2019   LDLDIRECT 133.3 03/11/2012   LDLCALC 129 (H) 08/16/2019   ALT 34 08/16/2019   AST 20 08/16/2019   NA 138 08/16/2019   K 4.3 08/16/2019   CL 102 08/16/2019   CREATININE 0.81 08/16/2019   BUN 21 08/16/2019   CO2 29 08/16/2019   TSH 3.82 02/06/2021   HGBA1C 5.6 07/29/2018    Lab Results  Component Value Date   TSH 3.82 02/06/2021   Lab Results  Component Value Date   WBC 4.7 08/16/2019   HGB 14.3 08/16/2019   HCT 42.6 08/16/2019   MCV 93.0 08/16/2019   PLT 267.0 08/16/2019   Lab Results  Component Value Date   NA 138 08/16/2019   K 4.3 08/16/2019   CO2 29 08/16/2019   GLUCOSE 99 08/16/2019   BUN 21 08/16/2019   CREATININE 0.81 08/16/2019   BILITOT 0.5 08/16/2019   ALKPHOS 57 08/16/2019    AST 20 08/16/2019   ALT 34 08/16/2019   PROT 6.3 08/16/2019  ALBUMIN 4.4 08/16/2019   CALCIUM 9.3 08/16/2019   GFR 71.41 08/16/2019   Lab Results  Component Value Date   CHOL 213 (H) 08/16/2019   Lab Results  Component Value Date   HDL 62.90 08/16/2019   Lab Results  Component Value Date   LDLCALC 129 (H) 08/16/2019   Lab Results  Component Value Date   TRIG 107.0 08/16/2019   Lab Results  Component Value Date   CHOLHDL 3 08/16/2019   Lab Results  Component Value Date   HGBA1C 5.6 07/29/2018       Assessment & Plan:   Problem List Items Addressed This Visit   None Visit Diagnoses     COVID-19    -  Primary   Relevant Medications   molnupiravir EUA (LAGEVRIO) 200 mg CAPS capsule   promethazine-dextromethorphan (PROMETHAZINE-DM) 6.25-15 MG/5ML syrup       I have discontinued Herbie Saxon[facility] COVID-19 Home Test. I am also having her start on molnupiravir EUA and promethazine-dextromethorphan. Additionally, I am having her maintain her EPINEPHrine, Melatonin, acetaminophen, FLUoxetine, levothyroxine, and multivitamin.  Meds ordered this encounter  Medications   molnupiravir EUA (LAGEVRIO) 200 mg CAPS capsule    Sig: Take 4 capsules (800 mg total) by mouth 2 (two) times daily for 5 days.    Dispense:  40 capsule    Refill:  0   promethazine-dextromethorphan (PROMETHAZINE-DM) 6.25-15 MG/5ML syrup    Sig: Take 5 mLs by mouth 4 (four) times daily as needed.    Dispense:  118 mL    Refill:  0    I discussed the assessment and treatment plan with the patient. The patient was provided an opportunity to ask questions and all were answered. The patient agreed with the plan and demonstrated an understanding of the instructions.   The patient was advised to call back or seek an in-person evaluation if the symptoms worsen or if the condition fails to improve as anticipated.   Ann Held, DO [place] [facility] [phone] (phone) [phone] (fax)  [place]

## 2021-08-10 ENCOUNTER — Other Ambulatory Visit (HOSPITAL_COMMUNITY): Payer: Self-pay

## 2021-08-10 ENCOUNTER — Encounter: Payer: Self-pay | Admitting: Family Medicine

## 2021-08-10 ENCOUNTER — Other Ambulatory Visit (HOSPITAL_BASED_OUTPATIENT_CLINIC_OR_DEPARTMENT_OTHER): Payer: Self-pay | Admitting: Family Medicine

## 2021-08-10 ENCOUNTER — Ambulatory Visit (INDEPENDENT_AMBULATORY_CARE_PROVIDER_SITE_OTHER): Payer: 59 | Admitting: Family Medicine

## 2021-08-10 VITALS — BP 110/80 | HR 74 | Temp 97.9°F | Resp 18 | Ht 70.0 in | Wt 209.4 lb

## 2021-08-10 DIAGNOSIS — E039 Hypothyroidism, unspecified: Secondary | ICD-10-CM

## 2021-08-10 DIAGNOSIS — E2839 Other primary ovarian failure: Secondary | ICD-10-CM

## 2021-08-10 DIAGNOSIS — Z23 Encounter for immunization: Secondary | ICD-10-CM

## 2021-08-10 DIAGNOSIS — Z0000 Encounter for general adult medical examination without abnormal findings: Secondary | ICD-10-CM | POA: Diagnosis not present

## 2021-08-10 DIAGNOSIS — Z1231 Encounter for screening mammogram for malignant neoplasm of breast: Secondary | ICD-10-CM

## 2021-08-10 HISTORY — DX: Encounter for general adult medical examination without abnormal findings: Z00.00

## 2021-08-10 HISTORY — DX: Hypothyroidism, unspecified: E03.9

## 2021-08-10 LAB — COMPREHENSIVE METABOLIC PANEL
ALT: 27 U/L (ref 0–35)
AST: 19 U/L (ref 0–37)
Albumin: 4.5 g/dL (ref 3.5–5.2)
Alkaline Phosphatase: 54 U/L (ref 39–117)
BUN: 19 mg/dL (ref 6–23)
CO2: 30 mEq/L (ref 19–32)
Calcium: 9.7 mg/dL (ref 8.4–10.5)
Chloride: 105 mEq/L (ref 96–112)
Creatinine, Ser: 0.78 mg/dL (ref 0.40–1.20)
GFR: 79.95 mL/min (ref >60.00)
Glucose, Bld: 102 mg/dL — ABNORMAL HIGH (ref 70–99)
Potassium: 4.3 mEq/L (ref 3.5–5.1)
Sodium: 140 mEq/L (ref 135–145)
Total Bilirubin: 0.7 mg/dL (ref 0.2–1.2)
Total Protein: 6.5 g/dL (ref 6.0–8.3)

## 2021-08-10 LAB — CBC WITH DIFFERENTIAL/PLATELET
Basophils Absolute: 0 10*3/uL (ref 0.0–0.1)
Basophils Relative: 0.8 % (ref 0.0–3.0)
Eosinophils Absolute: 0.1 10*3/uL (ref 0.0–0.7)
Eosinophils Relative: 2.4 % (ref 0.0–5.0)
HCT: 41.7 % (ref 36.0–46.0)
Hemoglobin: 14 g/dL (ref 12.0–15.0)
Lymphocytes Relative: 35.7 % (ref 12.0–46.0)
Lymphs Abs: 1.8 10*3/uL (ref 0.7–4.0)
MCHC: 33.6 g/dL (ref 30.0–36.0)
MCV: 92 fl (ref 78.0–100.0)
Monocytes Absolute: 0.4 10*3/uL (ref 0.1–1.0)
Monocytes Relative: 7.8 % (ref 3.0–12.0)
Neutro Abs: 2.6 10*3/uL (ref 1.4–7.7)
Neutrophils Relative %: 53.3 % (ref 43.0–77.0)
Platelets: 253 10*3/uL (ref 150.0–400.0)
RBC: 4.53 Mil/uL (ref 3.87–5.11)
RDW: 13.6 % (ref 11.5–15.5)
WBC: 5 10*3/uL (ref 4.0–10.5)

## 2021-08-10 LAB — TSH: TSH: 2.32 u[IU]/mL (ref 0.35–5.50)

## 2021-08-10 LAB — LIPID PANEL
Cholesterol: 192 mg/dL (ref 0–200)
HDL: 63.2 mg/dL (ref >39.00)
LDL Cholesterol: 117 mg/dL — ABNORMAL HIGH (ref 0–99)
NonHDL: 129.24
Total CHOL/HDL Ratio: 3
Triglycerides: 63 mg/dL (ref 0.0–149.0)
VLDL: 12.6 mg/dL (ref 0.0–40.0)

## 2021-08-10 MED ORDER — EPINEPHRINE 0.3 MG/0.3ML IJ SOAJ
0.3000 mg | Freq: Once | INTRAMUSCULAR | 0 refills | Status: AC | PRN
Start: 2021-08-10 — End: ?
  Filled 2021-08-10: qty 2, 15d supply, fill #0
  Filled 2021-08-27: qty 2, 30d supply, fill #0

## 2021-08-10 NOTE — Patient Instructions (Signed)

## 2021-08-10 NOTE — Assessment & Plan Note (Signed)
ghm utd Check labs  

## 2021-08-10 NOTE — Progress Notes (Signed)
? ?Subjective:  ? ?By signing my name below, I, Zite Okoli, attest that this documentation has been prepared under the direction and in the presence of Ann Held, DO. 08/10/2021   ? ? Patient ID: Michelle Pratt, female    DOB: 04-Mar-1956, 66 y.o.   MRN: [id number] ? ?Chief Complaint  ?Patient presents with  ? Annual Exam  ?  Pt states fasting   ? ? ?HPI ?Patient is in today for a comprehensive physical exam ? ?She is requesting for a refill on an epipen. ? ?She is complaining of  scaly patch on her nose that keeps coming back. She does not think it is too worrisome at this time.  ? ?She is complaining of wrist pain in her left hand. She uses a soft splint to manage the pain. ? ?She denies fever, hearing loss, ear pain,congestion, sinus pain, sore throat, eye pain, chest pain, palpitations, cough, shortness of breath, wheezing, nausea. vomiting, diarrhea, constipation, blood in stool, dysuria,frequency, hematuria and headaches.  ? ?She will receive the 2nd dose of the shingles vaccine today. She has 3 Covid-19 vaccines at this time. ? ?No changes in family medical history. No recent surgeries.  ? ?Past Medical History:  ?Diagnosis Date  ? Depression   ? Glaucoma   ? Joint pain   ? Menopause   ? Thyroid disease   ? hypo  ? Vitamin D deficiency   ? ? ?Past Surgical History:  ?Procedure Laterality Date  ? COLONOSCOPY  05/30/2010  ? Dr.Jacobs  ? foot Left 2019  ? dr Jacqualyn [facility]-- [facility] ----  first metarsal replacement L foot   ? oraf    ? left eyebrow  ? ORIF ELBOW FRACTURE    ? TONSILLECTOMY    ? TUBAL LIGATION    ? ? ?Family History  ?Problem Relation Age of Onset  ? Alcohol abuse Mother   ? Parkinsonism Mother   ? Hypertension Father   ? Cancer Father 34  ?     prostate  ? Hyperlipidemia Father   ? Stroke Brother   ? Colon cancer Neg Hx   ? ? ?Social History  ? ?Socioeconomic History  ? Marital status: Married  ?  Spouse name: [name]  ? Number of children: 3  ? Years of education: Not on file  ? Highest  education level: Not on file  ?Occupational History  ? Occupation: Therapist, sports  ?  Employer: [employer]  ?  Comment: pre surgical  ?Tobacco Use  ? Smoking status: Former  ?  Packs/day: 0.75  ?  Years: 18.00  ?  Pack years: 13.50  ?  Types: Cigarettes  ?  Quit date: 08/15/1992  ?  Years since quitting: 29.0  ? Smokeless tobacco: Never  ? Tobacco comments:  ?  over 20 years ago  ?Vaping Use  ? Vaping Use: Never used  ?Substance and Sexual Activity  ? Alcohol use: Yes  ?  Alcohol/week: 5.0 - 7.0 standard drinks  ?  Types: 5 - 7 Standard drinks or equivalent per week  ? Drug use: No  ? Sexual activity: Yes  ?  Partners: Male  ?Other Topics Concern  ? Not on file  ?Social History Narrative  ? Exercise--some bike riding  ? Caffeine Use: daily 1 cup of coffee  ? ?Social Determinants of Health  ? ?Financial Resource Strain: Not on file  ?Food Insecurity: Not on file  ?Transportation Needs: Not on file  ?Physical Activity: Not  on file  ?Stress: Not on file  ?Social Connections: Not on file  ?Intimate Partner Violence: Not on file  ? ? ?Outpatient Medications Prior to Visit  ?Medication Sig Dispense Refill  ? acetaminophen (TYLENOL) 325 MG tablet Take 650 mg by mouth every 6 (six) hours as needed.    ? FLUoxetine (PROZAC) 20 MG tablet TAKE 1 TABLET BY MOUTH DAILY. (please make appt for refills) 90 tablet 3  ? levothyroxine (SYNTHROID) 125 MCG tablet TAKE 1 TABLET BY MOUTH ONCE A DAY 90 tablet 3  ? Melatonin 3 MG CAPS Take 1 capsule by mouth.    ? Multiple Vitamin (MULTIVITAMIN) tablet Take 1 tablet by mouth daily.    ? EPINEPHrine (EPIPEN) 0.3 mg/0.3 mL IJ SOAJ injection Inject 0.3 mLs (0.3 mg total) into the muscle once as needed (allergic reaction). 1 Device 0  ? promethazine-dextromethorphan (PROMETHAZINE-DM) 6.25-15 MG/5ML syrup Take 5 mLs by mouth 4 (four) times daily as needed. 118 mL 0  ? ?No facility-administered medications prior to visit.  ? ? ?Allergies  ?Allergen Reactions  ? Bee Venom Anaphylaxis and Swelling   ? Morphine Nausea And Vomiting  ? ? ?Review of Systems  ?Constitutional:  Negative for fever.  ?HENT:  Negative for congestion, ear pain, hearing loss, sinus pain and sore throat.   ?Eyes:  Negative for blurred vision and pain.  ?Respiratory:  Negative for cough, sputum production, shortness of breath and wheezing.   ?Cardiovascular:  Negative for chest pain and palpitations.  ?Gastrointestinal:  Negative for blood in stool, constipation, diarrhea, nausea and vomiting.  ?Genitourinary:  Negative for dysuria, frequency, hematuria and urgency.  ?Musculoskeletal:  Positive for joint pain (left wrist). Negative for back pain, falls and myalgias.  ?Neurological:  Negative for dizziness, sensory change, loss of consciousness, weakness and headaches.  ?Endo/Heme/Allergies:  Negative for environmental allergies. Does not bruise/bleed easily.  ?Psychiatric/Behavioral:  Negative for depression and suicidal ideas. The patient is not nervous/anxious and does not have insomnia.   ? ?   ?Objective:  ?  ?Physical Exam ?Constitutional:   ?   General: She is not in acute distress. ?   Appearance: Normal appearance. She is not ill-appearing.  ?HENT:  ?   Head: Normocephalic and atraumatic.  ?   Right Ear: External ear normal.  ?   Left Ear: External ear normal.  ?Eyes:  ?   Extraocular Movements: Extraocular movements intact.  ?   Pupils: Pupils are equal, round, and reactive to light.  ?Cardiovascular:  ?   Rate and Rhythm: Normal rate and regular rhythm.  ?   Pulses: Normal pulses.  ?   Heart sounds: Normal heart sounds. No murmur heard. ?  No gallop.  ?Pulmonary:  ?   Effort: Pulmonary effort is normal. No respiratory distress.  ?   Breath sounds: Normal breath sounds. No wheezing, rhonchi or rales.  ?Abdominal:  ?   General: Bowel sounds are normal. There is no distension.  ?   Palpations: Abdomen is soft. There is no mass.  ?   Tenderness: There is no abdominal tenderness. There is no guarding or rebound.  ?   Hernia: No hernia  is present.  ?Musculoskeletal:  ?   Cervical back: Normal range of motion and neck supple.  ?Lymphadenopathy:  ?   Cervical: No cervical adenopathy.  ?Skin: ?   General: Skin is warm and dry.  ?Neurological:  ?   Mental Status: She is alert and oriented to person, place, and time.  ?Psychiatric:     ?  Behavior: Behavior normal.  ? ? ?BP 110/80 (BP Location: Left Arm, Patient Position: Sitting, Cuff Size: Normal)   Pulse 74   Temp 97.9 ?F (36.6 ?C) (Oral)   Resp 18   Ht 5\' 10"  (1.778 m)   Wt 209 lb 6.4 oz (95 kg)   SpO2 94%   BMI 30.05 kg/m?  ?Wt Readings from Last 3 Encounters:  ?08/10/21 209 lb 6.4 oz (95 kg)  ?04/10/21 209 lb (94.8 kg)  ?03/22/21 209 lb (94.8 kg)  ? ? ?Diabetic Foot Exam - Simple   ?No data filed ?  ? ?Lab Results  ?Component Value Date  ? WBC 4.7 08/16/2019  ? HGB 14.3 08/16/2019  ? HCT 42.6 08/16/2019  ? PLT 267.0 08/16/2019  ? GLUCOSE 99 08/16/2019  ? CHOL 213 (H) 08/16/2019  ? TRIG 107.0 08/16/2019  ? HDL 62.90 08/16/2019  ? LDLDIRECT 133.3 03/11/2012  ? LDLCALC 129 (H) 08/16/2019  ? ALT 34 08/16/2019  ? AST 20 08/16/2019  ? NA 138 08/16/2019  ? K 4.3 08/16/2019  ? CL 102 08/16/2019  ? CREATININE 0.81 08/16/2019  ? BUN 21 08/16/2019  ? CO2 29 08/16/2019  ? TSH 3.82 02/06/2021  ? HGBA1C 5.6 07/29/2018  ? ? ?Lab Results  ?Component Value Date  ? TSH 3.82 02/06/2021  ? ?Lab Results  ?Component Value Date  ? WBC 4.7 08/16/2019  ? HGB 14.3 08/16/2019  ? HCT 42.6 08/16/2019  ? MCV 93.0 08/16/2019  ? PLT 267.0 08/16/2019  ? ?Lab Results  ?Component Value Date  ? NA 138 08/16/2019  ? K 4.3 08/16/2019  ? CO2 29 08/16/2019  ? GLUCOSE 99 08/16/2019  ? BUN 21 08/16/2019  ? CREATININE 0.81 08/16/2019  ? BILITOT 0.5 08/16/2019  ? ALKPHOS 57 08/16/2019  ? AST 20 08/16/2019  ? ALT 34 08/16/2019  ? PROT 6.3 08/16/2019  ? ALBUMIN 4.4 08/16/2019  ? CALCIUM 9.3 08/16/2019  ? GFR 71.41 08/16/2019  ? ?Lab Results  ?Component Value Date  ? CHOL 213 (H) 08/16/2019  ? ?Lab Results  ?Component Value Date  ?  HDL 62.90 08/16/2019  ? ?Lab Results  ?Component Value Date  ? LDLCALC 129 (H) 08/16/2019  ? ?Lab Results  ?Component Value Date  ? TRIG 107.0 08/16/2019  ? ?Lab Results  ?Component Value Date  ? CHOLHDL 3 04/05/

## 2021-08-21 ENCOUNTER — Other Ambulatory Visit (HOSPITAL_COMMUNITY): Payer: Self-pay

## 2021-08-27 ENCOUNTER — Other Ambulatory Visit (HOSPITAL_COMMUNITY): Payer: Self-pay

## 2021-09-03 ENCOUNTER — Ambulatory Visit (HOSPITAL_BASED_OUTPATIENT_CLINIC_OR_DEPARTMENT_OTHER)
Admission: RE | Admit: 2021-09-03 | Discharge: 2021-09-03 | Disposition: A | Discharge status: Home / Self Care | Payer: 59 | Source: Ambulatory Visit | Attending: Family Medicine | Admitting: Family Medicine

## 2021-09-03 ENCOUNTER — Encounter (HOSPITAL_BASED_OUTPATIENT_CLINIC_OR_DEPARTMENT_OTHER): Payer: Self-pay

## 2021-09-03 DIAGNOSIS — Z1231 Encounter for screening mammogram for malignant neoplasm of breast: Secondary | ICD-10-CM | POA: Diagnosis not present

## 2021-09-03 DIAGNOSIS — E2839 Other primary ovarian failure: Secondary | ICD-10-CM | POA: Diagnosis not present

## 2021-09-10 ENCOUNTER — Other Ambulatory Visit (HOSPITAL_COMMUNITY): Payer: Self-pay

## 2021-09-12 ENCOUNTER — Ambulatory Visit (INDEPENDENT_AMBULATORY_CARE_PROVIDER_SITE_OTHER): Payer: 59

## 2021-09-12 ENCOUNTER — Ambulatory Visit
Admission: RE | Admit: 2021-09-12 | Discharge: 2021-09-12 | Disposition: A | Discharge status: Home / Self Care | Payer: 59 | Source: Ambulatory Visit | Attending: Urgent Care | Admitting: Urgent Care

## 2021-09-12 ENCOUNTER — Other Ambulatory Visit (HOSPITAL_COMMUNITY): Payer: Self-pay

## 2021-09-12 VITALS — BP 151/75 | HR 64 | Temp 98.4°F | Resp 18

## 2021-09-12 DIAGNOSIS — J019 Acute sinusitis, unspecified: Secondary | ICD-10-CM

## 2021-09-12 DIAGNOSIS — R0602 Shortness of breath: Secondary | ICD-10-CM | POA: Diagnosis not present

## 2021-09-12 DIAGNOSIS — R059 Cough, unspecified: Secondary | ICD-10-CM | POA: Diagnosis not present

## 2021-09-12 MED ORDER — AMOXICILLIN-POT CLAVULANATE 875-125 MG PO TABS
1.0000 | ORAL_TABLET | Freq: Two times a day (BID) | ORAL | 0 refills | Status: DC
  Filled 2021-09-12: qty 14, 7d supply, fill #0

## 2021-09-12 MED ORDER — CETIRIZINE HCL 10 MG PO TABS
10.0000 mg | ORAL_TABLET | Freq: Every day | ORAL | 0 refills | Status: DC
  Filled 2021-09-12: qty 30, 30d supply, fill #0

## 2021-09-12 MED ORDER — PROMETHAZINE-DM 6.25-15 MG/5ML PO SYRP
5.0000 mL | ORAL_SOLUTION | Freq: Three times a day (TID) | ORAL | 0 refills | Status: DC | PRN
  Filled 2021-09-12: qty 100, 6d supply, fill #0

## 2021-09-12 MED ORDER — PSEUDOEPHEDRINE HCL 30 MG PO TABS
30.0000 mg | ORAL_TABLET | Freq: Three times a day (TID) | ORAL | 0 refills | Status: DC | PRN
Start: 2021-09-12 — End: 2022-04-15
  Filled 2021-09-12: qty 30, 10d supply, fill #0

## 2021-09-12 NOTE — ED Triage Notes (Signed)
Pt c/o productive cough w/ yellow mucous x 1 week and states recently "I have felt a little breathless." Associated nasal congestion, fatigue. States "my uvula got so long it was gagging me."  ? ?Denies sore throat, headache, body aches.  ? ?Onset ~ 1 week ago.  ? ?States tried phenergan cough syrup, tylenol cold, and zyrtec at home.  ?

## 2021-09-12 NOTE — ED Provider Notes (Signed)
?[facility] ? ? ?MRN: [id number] DOB: Sep 13, 1955 ? ?Subjective:  ? ?Michelle Pratt is a 66 y.o. female presenting for 1 week history of persistent and worsening productive cough, sinus congestion, throat pain, uvula swelling.  Patient has felt more short of breath.  Has a history of allergic rhinitis.  She has been using her Zyrtec, has also used over-the-counter medications.  No overt chest pain, fevers. ? ?No current facility-administered medications for this encounter. ? ?Current Outpatient Medications:  ?  acetaminophen (TYLENOL) 325 MG tablet, Take 650 mg by mouth every 6 (six) hours as needed., Disp: , Rfl:  ?  EPINEPHrine 0.3 mg/0.3 mL IJ SOAJ injection, Inject 0.3 mg into the muscle once as needed (allergic reaction)., Disp: 2 each, Rfl: 0 ?  FLUoxetine (PROZAC) 20 MG tablet, TAKE 1 TABLET BY MOUTH DAILY. (please make appt for refills), Disp: 90 tablet, Rfl: 3 ?  levothyroxine (SYNTHROID) 125 MCG tablet, TAKE 1 TABLET BY MOUTH ONCE A DAY, Disp: 90 tablet, Rfl: 3 ?  Melatonin 3 MG CAPS, Take 1 capsule by mouth., Disp: , Rfl:  ?  Multiple Vitamin (MULTIVITAMIN) tablet, Take 1 tablet by mouth daily., Disp: , Rfl:   ? ?Allergies  ?Allergen Reactions  ? Bee Venom Anaphylaxis and Swelling  ? Morphine Nausea And Vomiting  ? ? ?Past Medical History:  ?Diagnosis Date  ? Depression   ? Glaucoma   ? Joint pain   ? Menopause   ? Thyroid disease   ? hypo  ? Vitamin D deficiency   ?  ? ?Past Surgical History:  ?Procedure Laterality Date  ? COLONOSCOPY  05/30/2010  ? Dr.Jacobs  ? foot Left 2019  ? dr Ardelle Anton-- [facility] ----  first metarsal replacement L foot   ? oraf    ? left eyebrow  ? ORIF ELBOW FRACTURE    ? TONSILLECTOMY    ? TUBAL LIGATION    ? ? ?Family History  ?Problem Relation Age of Onset  ? Alcohol abuse Mother   ? Parkinsonism Mother   ? Hypertension Father   ? Cancer Father 48  ?     prostate  ? Hyperlipidemia Father   ? Stroke Brother   ? Colon cancer Neg Hx   ? ? ?Social History  ? ?Tobacco  Use  ? Smoking status: Former  ?  Packs/day: 0.75  ?  Years: 18.00  ?  Pack years: 13.50  ?  Types: Cigarettes  ?  Quit date: 08/15/1992  ?  Years since quitting: 29.0  ? Smokeless tobacco: Never  ? Tobacco comments:  ?  over 20 years ago  ?Vaping Use  ? Vaping Use: Never used  ?Substance Use Topics  ? Alcohol use: Yes  ?  Alcohol/week: 5.0 - 7.0 standard drinks  ?  Types: 5 - 7 Standard drinks or equivalent per week  ? Drug use: No  ? ? ?ROS ? ? ?Objective:  ? ?Vitals: ?BP (!) 151/75 (BP Location: Left Arm)   Pulse 64   Temp 98.4 ?F (36.9 ?C) (Oral)   Resp 18   SpO2 93%  ? ?Physical Exam ?Constitutional:   ?   General: She is not in acute distress. ?   Appearance: Normal appearance. She is well-developed and normal weight. She is not ill-appearing, toxic-appearing or diaphoretic.  ?HENT:  ?   Head: Normocephalic and atraumatic.  ?   Right Ear: Tympanic membrane, ear canal and external ear normal. No drainage or tenderness. No middle ear effusion.  There is no impacted cerumen. Tympanic membrane is not erythematous.  ?   Left Ear: Tympanic membrane, ear canal and external ear normal. No drainage or tenderness.  No middle ear effusion. There is no impacted cerumen. Tympanic membrane is not erythematous.  ?   Nose: Congestion present. No rhinorrhea.  ?   Mouth/Throat:  ?   Mouth: Mucous membranes are moist. No oral lesions.  ?   Pharynx: No pharyngeal swelling, oropharyngeal exudate, posterior oropharyngeal erythema or uvula swelling.  ?   Tonsils: No tonsillar exudate or tonsillar abscesses.  ?   Comments: Significant postnasal drainage overlying pharynx. ?Eyes:  ?   General: No scleral icterus.    ?   Right eye: No discharge.     ?   Left eye: No discharge.  ?   Extraocular Movements: Extraocular movements intact.  ?   Right eye: Normal extraocular motion.  ?   Left eye: Normal extraocular motion.  ?   Conjunctiva/sclera: Conjunctivae normal.  ?Cardiovascular:  ?   Rate and Rhythm: Normal rate.  ?   Heart sounds:  No murmur heard. ?  No friction rub. No gallop.  ?Pulmonary:  ?   Effort: Pulmonary effort is normal. No respiratory distress.  ?   Breath sounds: No stridor. No wheezing, rhonchi or rales.  ?Chest:  ?   Chest wall: No tenderness.  ?Musculoskeletal:  ?   Cervical back: Normal range of motion and neck supple.  ?Lymphadenopathy:  ?   Cervical: No cervical adenopathy.  ?Skin: ?   General: Skin is warm and dry.  ?Neurological:  ?   General: No focal deficit present.  ?   Mental Status: She is alert and oriented to person, place, and time.  ?Psychiatric:     ?   Mood and Affect: Mood normal.     ?   Behavior: Behavior normal.  ? ? ?DG Chest 2 View ? ?Result Date: 09/12/2021 ?CLINICAL DATA:  Shortness of breath and cough. EXAM: CHEST - 2 VIEW COMPARISON:  None Available. FINDINGS: The heart size and mediastinal contours are within normal limits. Both lungs are clear. The visualized skeletal structures are unremarkable. IMPRESSION: No active cardiopulmonary disease. Electronically Signed   By: Elgie Collard M.D.   On: 09/12/2021 17:34   ? ? ?Assessment and Plan :  ? ?PDMP not reviewed this encounter. ? ?1. Acute non-recurrent sinusitis, unspecified location   ? ?Chest x-ray negative.  Will start empiric treatment for sinusitis with Augmentin.  Recommended supportive care otherwise including the continued use of oral antihistamine, decongestant. Counseled patient on potential for adverse effects with medications prescribed/recommended today, ER and return-to-clinic precautions discussed, patient verbalized understanding. ? ?  ?Wallis Bamberg, PA-C ?09/12/21 1817 ? ?

## 2021-12-05 ENCOUNTER — Other Ambulatory Visit (HOSPITAL_COMMUNITY): Payer: Self-pay

## 2021-12-14 ENCOUNTER — Other Ambulatory Visit (HOSPITAL_COMMUNITY): Payer: Self-pay

## 2022-02-04 ENCOUNTER — Other Ambulatory Visit: Payer: Self-pay | Admitting: Family Medicine

## 2022-02-04 ENCOUNTER — Other Ambulatory Visit (HOSPITAL_COMMUNITY): Payer: Self-pay

## 2022-02-04 DIAGNOSIS — E039 Hypothyroidism, unspecified: Secondary | ICD-10-CM

## 2022-02-04 DIAGNOSIS — F418 Other specified anxiety disorders: Secondary | ICD-10-CM

## 2022-02-04 MED ORDER — LEVOTHYROXINE SODIUM 125 MCG PO TABS
ORAL_TABLET | Freq: Every day | ORAL | 3 refills | Status: DC
  Filled 2022-02-04: qty 90, fill #0
  Filled 2022-02-04: qty 5, 5d supply, fill #0
  Filled 2022-03-13: qty 5, 5d supply, fill #1
  Filled 2022-03-15: qty 90, 90d supply, fill #2
  Filled 2022-03-18: qty 52, 52d supply, fill #2
  Filled 2022-05-12: qty 52, 52d supply, fill #3
  Filled 2022-07-11: qty 52, 52d supply, fill #4
  Filled 2022-08-29: qty 90, 90d supply, fill #5
  Filled 2022-12-13: qty 90, 90d supply, fill #6

## 2022-02-04 MED ORDER — FLUOXETINE HCL 20 MG PO TABS
ORAL_TABLET | Freq: Every day | ORAL | 3 refills | Status: DC
  Filled 2022-02-04: qty 5, 5d supply, fill #0
  Filled 2022-02-04: qty 90, fill #0
  Filled 2022-04-02: qty 5, 5d supply, fill #1
  Filled 2022-04-11: qty 90, 90d supply, fill #2

## 2022-02-04 NOTE — Telephone Encounter (Signed)
Okay to refill or do you want pt to have appt. Not seen since 3/23

## 2022-03-13 ENCOUNTER — Other Ambulatory Visit (HOSPITAL_COMMUNITY): Payer: Self-pay

## 2022-03-14 ENCOUNTER — Other Ambulatory Visit (HOSPITAL_COMMUNITY): Payer: Self-pay

## 2022-03-15 ENCOUNTER — Other Ambulatory Visit (HOSPITAL_COMMUNITY): Payer: Self-pay

## 2022-03-18 ENCOUNTER — Other Ambulatory Visit (HOSPITAL_COMMUNITY): Payer: Self-pay

## 2022-04-02 ENCOUNTER — Other Ambulatory Visit (HOSPITAL_COMMUNITY): Payer: Self-pay | Admitting: Urgent Care

## 2022-04-02 ENCOUNTER — Other Ambulatory Visit (HOSPITAL_COMMUNITY): Payer: Self-pay

## 2022-04-02 MED ORDER — CETIRIZINE HCL 10 MG PO TABS
10.0000 mg | ORAL_TABLET | Freq: Every day | ORAL | 0 refills | Status: DC
  Filled 2022-04-02: qty 30, 30d supply, fill #0

## 2022-04-02 NOTE — Telephone Encounter (Signed)
Future refills to be directed at her PCP.  I am an urgent care provider and for this occasion will provide a courtesy refill.  I will not be able to do future refills for the patient.

## 2022-04-03 ENCOUNTER — Other Ambulatory Visit (HOSPITAL_COMMUNITY): Payer: Self-pay

## 2022-04-06 ENCOUNTER — Other Ambulatory Visit (HOSPITAL_COMMUNITY): Payer: Self-pay

## 2022-04-12 ENCOUNTER — Other Ambulatory Visit (HOSPITAL_COMMUNITY): Payer: Self-pay

## 2022-04-15 ENCOUNTER — Encounter: Payer: Self-pay | Admitting: Family Medicine

## 2022-04-15 ENCOUNTER — Other Ambulatory Visit (HOSPITAL_COMMUNITY): Payer: Self-pay

## 2022-04-15 ENCOUNTER — Ambulatory Visit: Payer: 59 | Admitting: Family Medicine

## 2022-04-15 ENCOUNTER — Other Ambulatory Visit: Payer: Self-pay

## 2022-04-15 VITALS — BP 120/80 | HR 59 | Temp 98.0°F | Resp 18 | Ht 70.0 in | Wt 217.4 lb

## 2022-04-15 DIAGNOSIS — K219 Gastro-esophageal reflux disease without esophagitis: Secondary | ICD-10-CM

## 2022-04-15 DIAGNOSIS — Z23 Encounter for immunization: Secondary | ICD-10-CM | POA: Diagnosis not present

## 2022-04-15 DIAGNOSIS — B070 Plantar wart: Secondary | ICD-10-CM

## 2022-04-15 DIAGNOSIS — F418 Other specified anxiety disorders: Secondary | ICD-10-CM | POA: Diagnosis not present

## 2022-04-15 DIAGNOSIS — Z1231 Encounter for screening mammogram for malignant neoplasm of breast: Secondary | ICD-10-CM

## 2022-04-15 MED ORDER — WEGOVY 0.25 MG/0.5ML ~~LOC~~ SOAJ
0.2500 mg | SUBCUTANEOUS | 0 refills | Status: DC
  Filled 2022-04-15: qty 2, 28d supply, fill #0

## 2022-04-15 MED ORDER — IMIQUIMOD 5 % EX CREA
TOPICAL_CREAM | CUTANEOUS | 2 refills | Status: DC
  Filled 2022-04-15: qty 12, 28d supply, fill #0

## 2022-04-15 MED ORDER — FLUOXETINE HCL 20 MG PO TABS
ORAL_TABLET | Freq: Every day | ORAL | 3 refills | Status: DC
  Filled 2022-04-18: qty 90, 90d supply, fill #0
  Filled 2022-07-23 – 2022-07-24 (×2): qty 30, 30d supply, fill #1
  Filled 2022-08-28 – 2022-08-29 (×2): qty 30, 30d supply, fill #2
  Filled 2022-10-02: qty 30, 30d supply, fill #3

## 2022-04-15 MED ORDER — OMEPRAZOLE 20 MG PO CPDR
20.0000 mg | DELAYED_RELEASE_CAPSULE | Freq: Every day | ORAL | 3 refills | Status: DC
  Filled 2022-04-15: qty 90, 90d supply, fill #0

## 2022-04-15 NOTE — Assessment & Plan Note (Signed)
Start wegovy F/u 3 months or sooner prn Con't diet and exercise

## 2022-04-15 NOTE — Progress Notes (Addendum)
Subjective:   By signing my name below, I, Cassell Clement, attest that this documentation has been prepared under the direction and in the presence of Donato Schultz DO 04/15/2022   Patient ID: Michelle Pratt, female    DOB: 1955-12-17, 66 y.o.   MRN: [id number]  Chief Complaint  Patient presents with   Plantar Warts    Left foot   Gastroesophageal Reflux    Pt states having cough and burning. Pt states having sxs when she is laying down. Pt states using OTC meds with relief     HPI Patient is in today for an office visit  She is requesting a refill of 20 mg of Fluoxetine.   She complains of a plantar wart on her left foot. She has previously used a Aldarar 5% cream for her symptoms which improved it.   She complains of worsening reflux. She states that she has a cough that she originally believed was a leftover symptom of when she had Covid. She previously tried Zyrtec which alleviated her symptoms but did not resolve it. She then took OTC Omeprazole which significantly alleviated her symptoms. She notes that her cough worsens when she is laying down.   She is considering starting Wegovy. She states that she is switching her insurance to Medicare and is wondering if the medication would be covered after the switch. Wt Readings from Last 3 Encounters:  04/15/22 217 lb 6.4 oz (98.6 kg)  08/10/21 209 lb 6.4 oz (95 kg)  04/10/21 209 lb (94.8 kg)   She is interested in receiving the pneumonia vaccine during today's visit. She is UTD on her Shingles and Influenza vaccine.   Past Medical History:  Diagnosis Date   Depression    Glaucoma    Joint pain    Menopause    Thyroid disease    hypo   Vitamin D deficiency     Past Surgical History:  Procedure Laterality Date   COLONOSCOPY  05/30/2010   Dr.Jacobs   foot Left 2019   dr Ardelle Anton-- [facility] ----  first metarsal replacement L foot    oraf     left eyebrow   ORIF ELBOW FRACTURE     TONSILLECTOMY     TUBAL  LIGATION      Family History  Problem Relation Age of Onset   Alcohol abuse Mother    Parkinsonism Mother    Hypertension Father    Cancer Father 12       prostate   Hyperlipidemia Father    Stroke Brother    Colon cancer Neg Hx     Social History   Socioeconomic History   Marital status: Married    Spouse name: [name]   Number of children: 3   Years of education: Not on file   Highest education level: Not on file  Occupational History   Occupation: Teacher, 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[employer]    Comment: pre surgical  Tobacco Use   Smoking status: Former    Packs/day: 0.75    Years: 18.00    Total pack years: 13.50    Types: Cigarettes    Quit date: 08/15/1992    Years since quitting: 29.6   Smokeless tobacco: Never   Tobacco comments:    over 20 years ago  Vaping Use   Vaping Use: Never used  Substance and Sexual Activity   Alcohol use: Yes    Alcohol/week: 5.0 - 7.0 standard drinks of alcohol  Types: 5 - 7 Standard drinks or equivalent per week   Drug use: No   Sexual activity: Yes    Partners: Male  Other Topics Concern   Not on file  Social History Narrative   Exercise--some bike riding   Caffeine Use: daily 1 cup of coffee   Social Determinants of Health   Financial Resource Strain: Not on file  Food Insecurity: Not on file  Transportation Needs: Not on file  Physical Activity: Not on file  Stress: Not on file  Social Connections: Not on file  Intimate Partner Violence: Not on file    Outpatient Medications Prior to Visit  Medication Sig Dispense Refill   acetaminophen (TYLENOL) 325 MG tablet Take 650 mg by mouth every 6 (six) hours as needed.     cetirizine (ZYRTEC ALLERGY) 10 MG tablet Take 1 tablet (10 mg total) by mouth daily. 30 tablet 0   EPINEPHrine 0.3 mg/0.3 mL IJ SOAJ injection Inject 0.3 mg into the muscle once as needed (allergic reaction). 2 each 0   levothyroxine (SYNTHROID) 125 MCG tablet TAKE 1 TABLET BY MOUTH ONCE A DAY 90 tablet 3    Melatonin 3 MG CAPS Take 1 capsule by mouth.     Multiple Vitamin (MULTIVITAMIN) tablet Take 1 tablet by mouth daily.     amoxicillin-clavulanate (AUGMENTIN) 875-125 MG tablet Take 1 tablet by mouth 2 (two) times daily. 14 tablet 0   FLUoxetine (PROZAC) 20 MG tablet TAKE 1 TABLET BY MOUTH DAILY. (please make appt for refills) 90 tablet 3   promethazine-dextromethorphan (PROMETHAZINE-DM) 6.25-15 MG/5ML syrup Take 5 mLs by mouth 3 (three) times daily as needed for cough. 100 mL 0   pseudoephedrine (SUDAFED) 30 MG tablet Take 1 tablet (30 mg total) by mouth every 8 (eight) hours as needed for congestion. 30 tablet 0   No facility-administered medications prior to visit.    Allergies  Allergen Reactions   Bee Venom Anaphylaxis and Swelling   Morphine Nausea And Vomiting    Review of Systems  Constitutional:  Negative for chills, fever and malaise/fatigue.  HENT:  Negative for congestion and hearing loss.   Eyes:  Negative for discharge.  Respiratory:  Positive for cough. Negative for sputum production and shortness of breath.   Cardiovascular:  Negative for chest pain, palpitations and leg swelling.  Gastrointestinal:  Negative for abdominal pain, blood in stool, constipation, diarrhea, heartburn, nausea and vomiting.  Genitourinary:  Negative for dysuria, frequency, hematuria and urgency.  Musculoskeletal:  Negative for back pain, falls and myalgias.  Skin:  Negative for rash.       (+) Plantar Wart (Left Foot)  Neurological:  Negative for dizziness, sensory change, loss of consciousness, weakness and headaches.  Endo/Heme/Allergies:  Negative for environmental allergies. Does not bruise/bleed easily.  Psychiatric/Behavioral:  Negative for depression and suicidal ideas. The patient is not nervous/anxious and does not have insomnia.        Objective:    Physical Exam Vitals and nursing note reviewed.  Constitutional:      General: She is not in acute distress.    Appearance:  Normal appearance. She is not ill-appearing.  HENT:     Head: Normocephalic and atraumatic.     Right Ear: External ear normal.     Left Ear: External ear normal.  Eyes:     Extraocular Movements: Extraocular movements intact.     Pupils: Pupils are equal, round, and reactive to light.  Cardiovascular:     Rate and Rhythm: Normal  rate and regular rhythm.     Heart sounds: Normal heart sounds. No murmur heard.    No gallop.  Pulmonary:     Effort: Pulmonary effort is normal. No respiratory distress.     Breath sounds: Normal breath sounds. No wheezing or rales.  Feet:     Comments: Small plantar foot on ball of left foot Skin:    General: Skin is warm and dry.     Findings: Lesion present.     Comments: Plantar wart ball of L foot   Neurological:     Mental Status: She is alert and oriented to person, place, and time.  Psychiatric:        Judgment: Judgment normal.     BP 120/80 (BP Location: Left Arm, Patient Position: Sitting, Cuff Size: Normal)   Pulse (!) 59   Temp 98 F (36.7 C) (Oral)   Resp 18   Ht 5\' 10"  (1.778 m)   Wt 217 lb 6.4 oz (98.6 kg)   SpO2 96%   BMI 31.19 kg/m  Wt Readings from Last 3 Encounters:  04/15/22 217 lb 6.4 oz (98.6 kg)  08/10/21 209 lb 6.4 oz (95 kg)  04/10/21 209 lb (94.8 kg)    Diabetic Foot Exam - Simple   No data filed    Lab Results  Component Value Date   WBC 5.0 08/10/2021   HGB 14.0 08/10/2021   HCT 41.7 08/10/2021   PLT 253.0 08/10/2021   GLUCOSE 102 (H) 08/10/2021   CHOL 192 08/10/2021   TRIG 63.0 08/10/2021   HDL 63.20 08/10/2021   LDLDIRECT 133.3 03/11/2012   LDLCALC 117 (H) 08/10/2021   ALT 27 08/10/2021   AST 19 08/10/2021   NA 140 08/10/2021   K 4.3 08/10/2021   CL 105 08/10/2021   CREATININE 0.78 08/10/2021   BUN 19 08/10/2021   CO2 30 08/10/2021   TSH 2.32 08/10/2021   HGBA1C 5.6 07/29/2018    Lab Results  Component Value Date   TSH 2.32 08/10/2021   Lab Results  Component Value Date   WBC 5.0  08/10/2021   HGB 14.0 08/10/2021   HCT 41.7 08/10/2021   MCV 92.0 08/10/2021   PLT 253.0 08/10/2021   Lab Results  Component Value Date   NA 140 08/10/2021   K 4.3 08/10/2021   CO2 30 08/10/2021   GLUCOSE 102 (H) 08/10/2021   BUN 19 08/10/2021   CREATININE 0.78 08/10/2021   BILITOT 0.7 08/10/2021   ALKPHOS 54 08/10/2021   AST 19 08/10/2021   ALT 27 08/10/2021   PROT 6.5 08/10/2021   ALBUMIN 4.5 08/10/2021   CALCIUM 9.7 08/10/2021   GFR 79.95 08/10/2021   Lab Results  Component Value Date   CHOL 192 08/10/2021   Lab Results  Component Value Date   HDL 63.20 08/10/2021   Lab Results  Component Value Date   LDLCALC 117 (H) 08/10/2021   Lab Results  Component Value Date   TRIG 63.0 08/10/2021   Lab Results  Component Value Date   CHOLHDL 3 08/10/2021   Lab Results  Component Value Date   HGBA1C 5.6 07/29/2018       Assessment & Plan:   Problem List Items Addressed This Visit       Unprioritized   Morbid obesity ([place])    Start wegovy F/u 3 months or sooner prn Con't diet and exercise       Relevant Medications   Semaglutide-Weight Management (WEGOVY) 0.25 MG/0.5ML SOAJ  Other Visit Diagnoses     Plantar wart of left foot    -  Primary   Relevant Medications   imiquimod (ALDARA) 5 % cream   Depression with anxiety       Relevant Medications   FLUoxetine (PROZAC) 20 MG tablet   Gastroesophageal reflux disease, unspecified whether esophagitis present       Relevant Medications   omeprazole (PRILOSEC) 20 MG capsule   Need for pneumococcal 20-valent conjugate vaccination       Relevant Orders   Pneumococcal conjugate vaccine 20-valent (Prevnar 20) (Completed)      Meds ordered this encounter  Medications   FLUoxetine (PROZAC) 20 MG tablet    Sig: TAKE 1 TABLET BY MOUTH DAILY. (please make appt for refills)    Dispense:  90 tablet    Refill:  3   imiquimod (ALDARA) 5 % cream    Sig: Apply 3 times per week. Leave on for 6-10 hours as  directed.    Dispense:  12 each    Refill:  2   omeprazole (PRILOSEC) 20 MG capsule    Sig: Take 1 capsule (20 mg total) by mouth daily.    Dispense:  90 capsule    Refill:  3   Semaglutide-Weight Management (WEGOVY) 0.25 MG/0.5ML SOAJ    Sig: Inject 0.25 mg into the skin once a week.    Dispense:  2 mL    Refill:  0    I, Donato Schultz, DO, personally preformed the services described in this documentation.  All medical record entries made by the scribe were at my direction and in my presence.  I have reviewed the chart and discharge instructions (if applicable) and agree that the record reflects my personal performance and is accurate and complete. 04/15/2022   I,Amber Collins,acting as a scribe for Donato Schultz, DO.,have documented all relevant documentation on the behalf of Donato Schultz, DO,as directed by  Donato Schultz, DO while in the presence of Donato Schultz, DO.    Donato Schultz, DO

## 2022-04-15 NOTE — Patient Instructions (Signed)
Food Choices for Gastroesophageal Reflux Disease, Adult When you have gastroesophageal reflux disease (GERD), the foods you eat and your eating habits are very important. Choosing the right foods can help ease the discomfort of GERD. Consider working with a dietitian to help you make healthy food choices. What are tips for following this plan? Reading food labels Look for foods that are low in saturated fat. Foods that have less than 5% of daily value (DV) of fat and 0 g of trans fats may help with your symptoms. Cooking Cook foods using methods other than frying. This may include baking, steaming, grilling, or broiling. These are all methods that do not need a lot of fat for cooking. To add flavor, try to use herbs that are low in spice and acidity. Meal planning  Choose healthy foods that are low in fat, such as fruits, vegetables, whole grains, low-fat dairy products, lean meats, fish, and poultry. Eat frequent, small meals instead of three large meals each day. Eat your meals slowly, in a relaxed setting. Avoid bending over or lying down until 2-3 hours after eating. Limit high-fat foods such as fatty meats or fried foods. Limit your intake of fatty foods, such as oils, butter, and shortening. Avoid the following as told by your health care provider: Foods that cause symptoms. These may be different for different people. Keep a food diary to keep track of foods that cause symptoms. Alcohol. Drinking large amounts of liquid with meals. Eating meals during the 2-3 hours before bed. Lifestyle Maintain a healthy weight. Ask your health care provider what weight is healthy for you. If you need to lose weight, work with your health care provider to do so safely. Exercise for at least 30 minutes on 5 or more days each week, or as told by your health care provider. Avoid wearing clothes that fit tightly around your waist and chest. Do not use any products that contain nicotine or tobacco. These  products include cigarettes, chewing tobacco, and vaping devices, such as e-cigarettes. If you need help quitting, ask your health care provider. Sleep with the head of your bed raised. Use a wedge under the mattress or blocks under the bed frame to raise the head of the bed. Chew sugar-free gum after mealtimes. What foods should I eat?  Eat a healthy, well-balanced diet of fruits, vegetables, whole grains, low-fat dairy products, lean meats, fish, and poultry. Each person is different. Foods that may trigger symptoms in one person may not trigger any symptoms in another person. Work with your health care provider to identify foods that are safe for you. The items listed above may not be a complete list of recommended foods and beverages. Contact a dietitian for more information. What foods should I avoid? Limiting some of these foods may help manage the symptoms of GERD. Everyone is different. Consult a dietitian or your health care provider to help you identify the exact foods to avoid, if any. Fruits Any fruits prepared with added fat. Any fruits that cause symptoms. For some people this may include citrus fruits, such as oranges, grapefruit, pineapple, and lemons. Vegetables Deep-fried vegetables. French fries. Any vegetables prepared with added fat. Any vegetables that cause symptoms. For some people, this may include tomatoes and tomato products, chili peppers, onions and garlic, and horseradish. Grains Pastries or quick breads with added fat. Meats and other proteins High-fat meats, such as fatty beef or pork, hot dogs, ribs, ham, sausage, salami, and bacon. Fried meat or protein, including   fried fish and fried chicken. Nuts and nut butters, in large amounts. Dairy Whole milk and chocolate milk. Sour cream. Cream. Ice cream. Cream cheese. Milkshakes. Fats and oils Butter. Margarine. Shortening. Ghee. Beverages Coffee and tea, with or without caffeine. Carbonated beverages. Sodas. Energy  drinks. Fruit juice made with acidic fruits, such as orange or grapefruit. Tomato juice. Alcoholic drinks. Sweets and desserts Chocolate and cocoa. Donuts. Seasonings and condiments Pepper. Peppermint and spearmint. Added salt. Any condiments, herbs, or seasonings that cause symptoms. For some people, this may include curry, hot sauce, or vinegar-based salad dressings. The items listed above may not be a complete list of foods and beverages to avoid. Contact a dietitian for more information. Questions to ask your health care provider Diet and lifestyle changes are usually the first steps that are taken to manage symptoms of GERD. If diet and lifestyle changes do not improve your symptoms, talk with your health care provider about taking medicines. Where to find more information [employer]intestinal [employer]: aboutgerd.org Summary When you have gastroesophageal reflux disease (GERD), food and lifestyle choices may be very helpful in easing the discomfort of GERD. Eat frequent, small meals instead of three large meals each day. Eat your meals slowly, in a relaxed setting. Avoid bending over or lying down until 2-3 hours after eating. Limit high-fat foods such as fatty meats or fried foods. This information is not intended to replace advice given to you by your health care provider. Make sure you discuss any questions you have with your health care provider. Document Revised: 11/08/2019 Document Reviewed: 11/08/2019 Elsevier Patient Education  2023 [employer] Inc.  

## 2022-04-16 ENCOUNTER — Telehealth: Payer: Self-pay

## 2022-04-16 NOTE — Telephone Encounter (Signed)
PA approved.   The request has been approved. The authorization is effective from 04/16/2022 to 11/05/2022, as long as the member is enrolled in their current health plan. The request was approved as submitted. The request was approved for 56mL per 28 days.Additional prior authorizations (PA) have been entered TIR:WERXVQ 0.5mg /0.72mL with a quantity limit of 59mL per 28 days (PA [id number])Wegovy 1mg /0.47mL with a quantity limit of 60mL per 28 days (PA 21004)Wegovy 1.7mg /0.76mL with a quantity limit of 47mL per 28 days (PA 21005)Wegovy 2.4mg /0.80mL with a quantity limit of 12mL per 28 days (PA 21006)These authorizations are effective 04/16/2022 through 11/05/2022. A written notification letter will follow with additional details

## 2022-04-16 NOTE — Telephone Encounter (Signed)
PA initiated via Covermymeds; KEY: [id number]. Awaiting determination.

## 2022-04-17 ENCOUNTER — Other Ambulatory Visit (HOSPITAL_COMMUNITY): Payer: Self-pay

## 2022-04-18 ENCOUNTER — Other Ambulatory Visit (HOSPITAL_COMMUNITY): Payer: Self-pay

## 2022-04-25 ENCOUNTER — Other Ambulatory Visit (HOSPITAL_COMMUNITY): Payer: Self-pay

## 2022-05-03 ENCOUNTER — Other Ambulatory Visit (HOSPITAL_COMMUNITY): Payer: Self-pay

## 2022-05-07 ENCOUNTER — Other Ambulatory Visit (HOSPITAL_COMMUNITY): Payer: Self-pay

## 2022-05-08 ENCOUNTER — Other Ambulatory Visit (HOSPITAL_COMMUNITY): Payer: Self-pay

## 2022-05-12 ENCOUNTER — Other Ambulatory Visit (HOSPITAL_COMMUNITY): Payer: Self-pay

## 2022-05-13 ENCOUNTER — Other Ambulatory Visit (HOSPITAL_COMMUNITY): Payer: Self-pay

## 2022-05-14 ENCOUNTER — Other Ambulatory Visit: Payer: Self-pay

## 2022-05-15 ENCOUNTER — Other Ambulatory Visit (HOSPITAL_COMMUNITY): Payer: Self-pay

## 2022-05-15 ENCOUNTER — Other Ambulatory Visit: Payer: Self-pay

## 2022-05-16 ENCOUNTER — Other Ambulatory Visit: Payer: Self-pay

## 2022-05-16 ENCOUNTER — Other Ambulatory Visit (HOSPITAL_COMMUNITY): Payer: Self-pay

## 2022-05-17 ENCOUNTER — Other Ambulatory Visit (HOSPITAL_COMMUNITY): Payer: Self-pay

## 2022-05-19 ENCOUNTER — Telehealth: Payer: PPO | Admitting: Family Medicine

## 2022-05-19 DIAGNOSIS — J208 Acute bronchitis due to other specified organisms: Secondary | ICD-10-CM | POA: Diagnosis not present

## 2022-05-19 MED ORDER — PREDNISONE 20 MG PO TABS
20.0000 mg | ORAL_TABLET | Freq: Two times a day (BID) | ORAL | 0 refills | Status: DC
  Filled 2022-05-19: qty 10, 5d supply, fill #0

## 2022-05-19 MED ORDER — BENZONATATE 200 MG PO CAPS
200.0000 mg | ORAL_CAPSULE | Freq: Two times a day (BID) | ORAL | 0 refills | Status: DC | PRN
  Filled 2022-05-19: qty 20, 10d supply, fill #0

## 2022-05-19 NOTE — Progress Notes (Signed)
We are sorry that you are not feeling well.  Here is how we plan to help!  Based on your presentation I believe you most likely have A cough due to a virus.  This is called viral bronchitis and is best treated by rest, plenty of fluids and control of the cough.  You may use Ibuprofen or Tylenol as directed to help your symptoms.     In addition you may use A prescription cough medication called Tessalon Perles 100mg . You may take 1-2 capsules every 8 hours as needed for your cough.  Prednisone 20 mg twice daily for 5 days.   From your responses in the eVisit questionnaire you describe inflammation in the upper respiratory tract which is causing a significant cough.  This is commonly called Bronchitis and has four common causes:   Allergies Viral Infections Acid Reflux Bacterial Infection Allergies, viruses and acid reflux are treated by controlling symptoms or eliminating the cause. An example might be a cough caused by taking certain blood pressure medications. You stop the cough by changing the medication. Another example might be a cough caused by acid reflux. Controlling the reflux helps control the cough.  USE OF BRONCHODILATOR ("RESCUE") INHALERS: There is a risk from using your bronchodilator too frequently.  The risk is that over-reliance on a medication which only relaxes the muscles surrounding the breathing tubes can reduce the effectiveness of medications prescribed to reduce swelling and congestion of the tubes themselves.  Although you feel brief relief from the bronchodilator inhaler, your asthma may actually be worsening with the tubes becoming more swollen and filled with mucus.  This can delay other crucial treatments, such as oral steroid medications. If you need to use a bronchodilator inhaler daily, several times per day, you should discuss this with your provider.  There are probably better treatments that could be used to keep your asthma under control.     HOME CARE Only  take medications as instructed by your medical team. Complete the entire course of an antibiotic. Drink plenty of fluids and get plenty of rest. Avoid close contacts especially the very young and the elderly Cover your mouth if you cough or cough into your sleeve. Always remember to wash your hands A steam or ultrasonic humidifier can help congestion.   GET HELP RIGHT AWAY IF: You develop worsening fever. You become short of breath You cough up blood. Your symptoms persist after you have completed your treatment plan MAKE SURE YOU  Understand these instructions. Will watch your condition. Will get help right away if you are not doing well or get worse.    Thank you for choosing an e-visit.  Your e-visit answers were reviewed by a board certified advanced clinical practitioner to complete your personal care plan. Depending upon the condition, your plan could have included both over the counter or prescription medications.  Please review your pharmacy choice. Make sure the pharmacy is open so you can pick up prescription now. If there is a problem, you may contact your provider through and have the prescription routed to another pharmacy.  Your safety is important to [employer]. If you have drug allergies check your prescription carefully.   For the next 24 hours you can use MyChart to ask questions about today's visit, request a non-urgent call back, or ask for a work or school excuse. You will get an email in the next two days asking about your experience. I hope that your e-visit has been valuable and  will speed your recovery.   have provided 5 minutes of non face to face time during this encounter for chart review and documentation.

## 2022-05-20 ENCOUNTER — Other Ambulatory Visit (HOSPITAL_COMMUNITY): Payer: Self-pay

## 2022-05-24 ENCOUNTER — Other Ambulatory Visit: Payer: Self-pay | Admitting: Family

## 2022-05-24 ENCOUNTER — Ambulatory Visit (INDEPENDENT_AMBULATORY_CARE_PROVIDER_SITE_OTHER): Payer: PPO | Admitting: Family

## 2022-05-24 ENCOUNTER — Other Ambulatory Visit (HOSPITAL_BASED_OUTPATIENT_CLINIC_OR_DEPARTMENT_OTHER): Payer: Self-pay

## 2022-05-24 ENCOUNTER — Encounter: Payer: Self-pay | Admitting: Family

## 2022-05-24 VITALS — BP 126/78 | HR 82 | Temp 98.0°F | Ht 70.0 in | Wt 216.8 lb

## 2022-05-24 DIAGNOSIS — J209 Acute bronchitis, unspecified: Secondary | ICD-10-CM | POA: Diagnosis not present

## 2022-05-24 DIAGNOSIS — R051 Acute cough: Secondary | ICD-10-CM | POA: Diagnosis not present

## 2022-05-24 MED ORDER — PREDNISONE 20 MG PO TABS
20.0000 mg | ORAL_TABLET | Freq: Every day | ORAL | 0 refills | Status: DC
  Filled 2022-05-24: qty 5, 5d supply, fill #0

## 2022-05-24 MED ORDER — ALBUTEROL SULFATE HFA 108 (90 BASE) MCG/ACT IN AERS
2.0000 | INHALATION_SPRAY | Freq: Four times a day (QID) | RESPIRATORY_TRACT | 2 refills | Status: DC | PRN
  Filled 2022-05-24: qty 6.7, 21d supply, fill #0

## 2022-05-24 MED ORDER — AZITHROMYCIN 250 MG PO TABS
ORAL_TABLET | ORAL | 0 refills | Status: DC
  Filled 2022-05-24: qty 6, 5d supply, fill #0

## 2022-05-24 MED ORDER — ALBUTEROL SULFATE (2.5 MG/3ML) 0.083% IN NEBU: 2.5000 mg | INHALATION_SOLUTION | Freq: Once | RESPIRATORY_TRACT | Status: AC

## 2022-05-24 NOTE — Progress Notes (Signed)
Michelle Pratt is a 67 y.o. female with the following history as recorded in Epic[facility]:  Patient Active Problem List   Diagnosis Date Noted   Preventative health care 08/10/2021   Hypothyroidism 08/10/2021   COVID-19 07/10/2021   Depression, major, single episode, moderate ([place]) 02/12/2019   Prediabetes 03/09/2018   Vitamin D deficiency 03/09/2018   Morbid obesity ([place]) 03/09/2018   Hallux limitus of left foot 06/08/2017   Bunion, right 06/08/2017   Cystitis 10/12/2015   Vaginal laceration 09/13/2014   Acute bacterial sinusitis 05/30/2014   History of anaphylaxis 12/31/2013   Allergic rhinitis 12/31/2013   Cough 12/31/2013   Foot pain, bilateral 10/30/2012   HOT FLASHES 03/02/2010   POSTMENOPAUSAL STATUS 03/02/2010   NONTOXIC MULTINODULAR GOITER 10/20/2009   Other specified hypothyroidism 10/20/2009   DYSPEPSIA 10/20/2009   PMDD 10/20/2009   OTHER ABNORMAL FINDING RADIOLOGICAL EXAM BREAST 10/20/2009    Current Outpatient Medications  Medication Sig Dispense Refill   acetaminophen (TYLENOL) 325 MG tablet Take 650 mg by mouth every 6 (six) hours as needed.     albuterol (VENTOLIN HFA) 108 (90 Base) MCG/ACT inhaler Inhale 2 puffs into the lungs every 6 (six) hours as needed for wheezing or shortness of breath. 6.7 g 2   azithromycin (ZITHROMAX Z-PAK) 250 MG tablet Take 2 tablets (500 mg) by mouth today, then 1 tablet (250 mg) by mouth daily x4 days. 6 tablet 0   cetirizine (ZYRTEC ALLERGY) 10 MG tablet Take 1 tablet (10 mg total) by mouth daily. 30 tablet 0   EPINEPHrine 0.3 mg/0.3 mL IJ SOAJ injection Inject 0.3 mg into the muscle once as needed (allergic reaction). 2 each 0   FLUoxetine (PROZAC) 20 MG tablet TAKE 1 TABLET BY MOUTH DAILY. (please make appt for refills) 90 tablet 3   imiquimod (ALDARA) 5 % cream Apply 3 times per week. Leave on for 6-10 hours as directed. 12 each 2   levothyroxine (SYNTHROID) 125 MCG tablet TAKE 1 TABLET BY MOUTH ONCE A DAY 90 tablet 3   Melatonin  3 MG CAPS Take 1 capsule by mouth.     Multiple Vitamin (MULTIVITAMIN) tablet Take 1 tablet by mouth daily.     omeprazole (PRILOSEC) 20 MG capsule Take 1 capsule (20 mg total) by mouth daily. 90 capsule 3   predniSONE (DELTASONE) 20 MG tablet Take 1 tablet (20 mg total) by mouth daily with breakfast. 5 tablet 0   benzonatate (TESSALON) 200 MG capsule Take 1 capsule (200 mg total) by mouth 2 (two) times daily as needed for cough. (Patient not taking: Reported on 05/24/2022) 20 capsule 0   Semaglutide-Weight Management (WEGOVY) 0.25 MG/0.5ML SOAJ Inject 0.25 mg into the skin once a week. (Patient not taking: Reported on 05/24/2022) 2 mL 0   Current Facility-Administered Medications  Medication Dose Route Frequency Provider Last Rate Last Admin   albuterol (PROVENTIL) (2.5 MG/3ML) 0.083% nebulizer solution 2.5 mg  2.5 mg Nebulization Once Marrian Salvage, FNP        Allergies: Bee venom and Morphine  Past Medical History:  Diagnosis Date   Depression    Glaucoma    Joint pain    Menopause    Thyroid disease    hypo   Vitamin D deficiency     Past Surgical History:  Procedure Laterality Date   COLONOSCOPY  05/30/2010   Dr.Jacobs   foot Left 2019   dr Jacqualyn Posey-- [facility] ----  first metarsal replacement L foot    oraf  left eyebrow   ORIF ELBOW FRACTURE     TONSILLECTOMY     TUBAL LIGATION      Family History  Problem Relation Age of Onset   Alcohol abuse Mother    Parkinsonism Mother    Hypertension Father    Cancer Father 36       prostate   Hyperlipidemia Father    Stroke Brother    Colon cancer Neg Hx     Social History   Tobacco Use   Smoking status: Former    Packs/day: 0.75    Years: 18.00    Total pack years: 13.50    Types: Cigarettes    Quit date: 08/15/1992    Years since quitting: 29.7   Smokeless tobacco: Never   Tobacco comments:    over 20 years ago  Substance Use Topics   Alcohol use: Yes    Alcohol/week: 5.0 - 7.0 standard drinks of  alcohol    Types: 5 - 7 Standard drinks or equivalent per week    Subjective:   Cough/ congestion x 10 days; feels like symptoms are progressively worsening; did e-visit last Sunday and was treated with prednisone/ Tessalon Perles; has been taking OTC Mucinex and Promethazine DM; barking cough- "Just can't shake it; "    Objective:  Vitals:   05/24/22 1450  BP: 126/78  Pulse: 82  Temp: 98 F (36.7 C)  TempSrc: Oral  SpO2: 98%  Weight: 216 lb 12.8 oz (98.3 kg)  Height: 5\' 10"  (1.778 m)    General: Well developed, well nourished, in no acute distress  Skin : Warm and dry.  Head: Normocephalic and atraumatic  Eyes: Sclera and conjunctiva clear; pupils round and reactive to light; extraocular movements intact  Ears: External normal; canals clear; tympanic membranes normal  Oropharynx: Pink, supple. No suspicious lesions  Neck: Supple without thyromegaly, adenopathy  Lungs: Respirations unlabored; coarse breath sounds noted in upper lobes- no pneumonia noted; improvement after breathing treatment given in office;  CVS exam: normal rate and regular rhythm.  Neurologic: Alert and oriented; speech intact; face symmetrical; moves all extremities well; CNII-XII intact without focal deficit   Assessment:  1. Acute cough   2. Acute bronchitis, unspecified organism     Plan:  Albuterol nebulizer treatment given in office with relief; Patient asks for z-pak for treatment which is reasonable; extend prednisone for 5 more days; Rx for albuterol inhaler- use every 4-6 hours as needed; increase fluids, rest; work note given as requested; to consider CXR if symptoms persist.   No follow-ups on file.  No orders of the defined types were placed in this encounter.   Requested Prescriptions   Signed Prescriptions Disp Refills   azithromycin (ZITHROMAX Z-PAK) 250 MG tablet 6 tablet 0    Sig: Take 2 tablets (500 mg) by mouth today, then 1 tablet (250 mg) by mouth daily x4 days.   predniSONE  (DELTASONE) 20 MG tablet 5 tablet 0    Sig: Take 1 tablet (20 mg total) by mouth daily with breakfast.   albuterol (VENTOLIN HFA) 108 (90 Base) MCG/ACT inhaler 6.7 g 2    Sig: Inhale 2 puffs into the lungs every 6 (six) hours as needed for wheezing or shortness of breath.

## 2022-06-07 ENCOUNTER — Ambulatory Visit (HOSPITAL_BASED_OUTPATIENT_CLINIC_OR_DEPARTMENT_OTHER)
Admission: RE | Admit: 2022-06-07 | Discharge: 2022-06-07 | Disposition: A | Discharge status: Home / Self Care | Payer: PPO | Source: Ambulatory Visit | Attending: Family Medicine | Admitting: Family Medicine

## 2022-06-07 ENCOUNTER — Encounter: Payer: Self-pay | Admitting: Family Medicine

## 2022-06-07 ENCOUNTER — Ambulatory Visit (INDEPENDENT_AMBULATORY_CARE_PROVIDER_SITE_OTHER): Payer: PPO | Admitting: Family Medicine

## 2022-06-07 VITALS — BP 118/80 | HR 61 | Temp 98.5°F | Resp 18 | Ht 70.0 in | Wt 219.6 lb

## 2022-06-07 DIAGNOSIS — R1011 Right upper quadrant pain: Secondary | ICD-10-CM | POA: Diagnosis not present

## 2022-06-07 HISTORY — DX: Right upper quadrant pain: R10.11

## 2022-06-07 LAB — CBC WITH DIFFERENTIAL/PLATELET
Basophils Absolute: 0 10*3/uL (ref 0.0–0.1)
Basophils Relative: 0.9 % (ref 0.0–3.0)
Eosinophils Absolute: 0.1 10*3/uL (ref 0.0–0.7)
Eosinophils Relative: 3.1 % (ref 0.0–5.0)
HCT: 42.9 % (ref 36.0–46.0)
Hemoglobin: 14.5 g/dL (ref 12.0–15.0)
Lymphocytes Relative: 39.2 % (ref 12.0–46.0)
Lymphs Abs: 1.8 10*3/uL (ref 0.7–4.0)
MCHC: 33.8 g/dL (ref 30.0–36.0)
MCV: 91.5 fl (ref 78.0–100.0)
Monocytes Absolute: 0.3 10*3/uL (ref 0.1–1.0)
Monocytes Relative: 7.3 % (ref 3.0–12.0)
Neutro Abs: 2.2 10*3/uL (ref 1.4–7.7)
Neutrophils Relative %: 49.5 % (ref 43.0–77.0)
Platelets: 266 10*3/uL (ref 150.0–400.0)
RBC: 4.69 Mil/uL (ref 3.87–5.11)
RDW: 13.7 % (ref 11.5–15.5)
WBC: 4.5 10*3/uL (ref 4.0–10.5)

## 2022-06-07 LAB — COMPREHENSIVE METABOLIC PANEL
ALT: 26 U/L (ref 0–35)
AST: 16 U/L (ref 0–37)
Albumin: 4.4 g/dL (ref 3.5–5.2)
Alkaline Phosphatase: 50 U/L (ref 39–117)
BUN: 24 mg/dL — ABNORMAL HIGH (ref 6–23)
CO2: 30 mEq/L (ref 19–32)
Calcium: 9.1 mg/dL (ref 8.4–10.5)
Chloride: 105 mEq/L (ref 96–112)
Creatinine, Ser: 0.68 mg/dL (ref 0.40–1.20)
GFR: 91.14 mL/min (ref >60.00)
Glucose, Bld: 109 mg/dL — ABNORMAL HIGH (ref 70–99)
Potassium: 4.3 mEq/L (ref 3.5–5.1)
Sodium: 141 mEq/L (ref 135–145)
Total Bilirubin: 0.7 mg/dL (ref 0.2–1.2)
Total Protein: 6.5 g/dL (ref 6.0–8.3)

## 2022-06-07 LAB — AMYLASE: Amylase: 43 U/L (ref 27–131)

## 2022-06-07 LAB — LIPASE: Lipase: 22 U/L (ref 11.0–59.0)

## 2022-06-07 LAB — H. PYLORI ANTIBODY, IGG: H Pylori IgG: NEGATIVE

## 2022-06-07 NOTE — Progress Notes (Signed)
Established Patient Office Visit  Subjective   Patient ID: Michelle Pratt, female    DOB: 1955/05/21  Age: 67 y.o. MRN: [id number]  Chief Complaint  Patient presents with   Abdominal Pain    Pt states upper right pain for 1-2 days. Pt states pain went through to her back. Pt states sxs started after eating. No nausea/vomiting, reports heart burn    HPI Pt is here c/o Ruq pain that started Tuesday after eating eggs and cheese --- radiated to back and midepigastric.  No NVD, no fever     pain was 7-8/10 and now has subsided.     Patient Active Problem List   Diagnosis Date Noted   RUQ pain 06/07/2022   Preventative health care 08/10/2021   Hypothyroidism 08/10/2021   COVID-19 07/10/2021   Depression, major, single episode, moderate ([place]) 02/12/2019   Prediabetes 03/09/2018   Vitamin D deficiency 03/09/2018   Morbid obesity ([place]) 03/09/2018   Hallux limitus of left foot 06/08/2017   Bunion, right 06/08/2017   Cystitis 10/12/2015   Vaginal laceration 09/13/2014   Acute bacterial sinusitis 05/30/2014   History of anaphylaxis 12/31/2013   Allergic rhinitis 12/31/2013   Cough 12/31/2013   Foot pain, bilateral 10/30/2012   HOT FLASHES 03/02/2010   POSTMENOPAUSAL STATUS 03/02/2010   NONTOXIC MULTINODULAR GOITER 10/20/2009   Other specified hypothyroidism 10/20/2009   DYSPEPSIA 10/20/2009   PMDD 10/20/2009   OTHER ABNORMAL FINDING RADIOLOGICAL EXAM BREAST 10/20/2009   Past Medical History:  Diagnosis Date   Depression    Glaucoma    Joint pain    Menopause    Thyroid disease    hypo   Vitamin D deficiency    Past Surgical History:  Procedure Laterality Date   COLONOSCOPY  05/30/2010   Dr.Jacobs   foot Left 2019   dr Jacqualyn Posey-- [facility] ----  first metarsal replacement L foot    oraf     left eyebrow   ORIF ELBOW FRACTURE     TONSILLECTOMY     TUBAL LIGATION     Social History   Tobacco Use   Smoking status: Former    Packs/day: 0.75    Years: 18.00     Total pack years: 13.50    Types: Cigarettes    Quit date: 08/15/1992    Years since quitting: 29.8   Smokeless tobacco: Never   Tobacco comments:    over 20 years ago  Vaping Use   Vaping Use: Never used  Substance Use Topics   Alcohol use: Yes    Alcohol/week: 5.0 - 7.0 standard drinks of alcohol    Types: 5 - 7 Standard drinks or equivalent per week   Drug use: No   Social History   Socioeconomic History   Marital status: Married    Spouse name: [name]   Number of children: 3   Years of education: Not on file   Highest education level: Not on file  Occupational History   Occupation: Programmer, multimedia: [employer]    Comment: pre surgical  Tobacco Use   Smoking status: Former    Packs/day: 0.75    Years: 18.00    Total pack years: 13.50    Types: Cigarettes    Quit date: 08/15/1992    Years since quitting: 29.8   Smokeless tobacco: Never   Tobacco comments:    over 20 years ago  Vaping Use   Vaping Use: Never used  Substance and Sexual  Activity   Alcohol use: Yes    Alcohol/week: 5.0 - 7.0 standard drinks of alcohol    Types: 5 - 7 Standard drinks or equivalent per week   Drug use: No   Sexual activity: Yes    Partners: Male  Other Topics Concern   Not on file  Social History Narrative   Exercise--some bike riding   Caffeine Use: daily 1 cup of coffee   Social Determinants of Corporate investment banker Strain: Not on file  Food Insecurity: Not on file  Transportation Needs: Not on file  Physical Activity: Not on file  Stress: Not on file  Social Connections: Not on file  Intimate Partner Violence: Not on file   Family Status  Relation Name Status   Mother  Deceased at age 75       parkinsons   Father  Deceased   Brother  Deceased at age 73       carotid dissection   Neg Hx  (Not Specified)   Family History  Problem Relation Age of Onset   Alcohol abuse Mother    Parkinsonism Mother    Hypertension Father    Cancer Father 44        prostate   Hyperlipidemia Father    Stroke Brother    Colon cancer Neg Hx    Allergies  Allergen Reactions   Bee Venom Anaphylaxis and Swelling   Morphine Nausea And Vomiting      Review of Systems  Constitutional:  Negative for fever and malaise/fatigue.  HENT:  Negative for congestion.   Eyes:  Negative for blurred vision.  Respiratory:  Negative for cough and shortness of breath.   Cardiovascular:  Negative for chest pain, palpitations and leg swelling.  Gastrointestinal:  Positive for abdominal pain. Negative for blood in stool, constipation, diarrhea, heartburn, melena, nausea and vomiting.  Genitourinary:  Positive for hematuria (00).  Musculoskeletal:  Negative for back pain.  Skin:  Negative for rash.  Neurological:  Negative for loss of consciousness and headaches.      Objective:     BP 118/80 (BP Location: Left Arm, Patient Position: Sitting, Cuff Size: Large)   Pulse 61   Temp 98.5 F (36.9 C) (Oral)   Resp 18   Ht 5\' 10"  (1.778 m)   Wt 219 lb 9.6 oz (99.6 kg)   SpO2 99%   BMI 31.51 kg/m  BP Readings from Last 3 Encounters:  06/07/22 118/80  05/24/22 126/78  04/15/22 120/80   Wt Readings from Last 3 Encounters:  06/07/22 219 lb 9.6 oz (99.6 kg)  05/24/22 216 lb 12.8 oz (98.3 kg)  04/15/22 217 lb 6.4 oz (98.6 kg)   SpO2 Readings from Last 3 Encounters:  06/07/22 99%  05/24/22 98%  04/15/22 96%      Physical Exam Vitals and nursing note reviewed.  Constitutional:      Appearance: She is well-developed.  HENT:     Head: Normocephalic and atraumatic.  Eyes:     Conjunctiva/sclera: Conjunctivae normal.  Neck:     Thyroid: No thyromegaly.     Vascular: No carotid bruit or JVD.  Cardiovascular:     Rate and Rhythm: Normal rate and regular rhythm.     Heart sounds: Normal heart sounds. No murmur heard. Pulmonary:     Effort: Pulmonary effort is normal. No respiratory distress.     Breath sounds: Normal breath sounds. No wheezing or rales.   Chest:     Chest wall: No  tenderness.  Abdominal:     General: Bowel sounds are normal.     Palpations: Abdomen is soft. There is no shifting dullness, hepatomegaly or mass.     Tenderness: There is abdominal tenderness in the right upper quadrant.  Musculoskeletal:     Cervical back: Normal range of motion and neck supple.  Neurological:     Mental Status: She is alert and oriented to person, place, and time.      No results found for any visits on 06/07/22.  Last CBC Lab Results  Component Value Date   WBC 5.0 08/10/2021   HGB 14.0 08/10/2021   HCT 41.7 08/10/2021   MCV 92.0 08/10/2021   RDW 13.6 08/10/2021   PLT 253.0 16/02/9603   Last metabolic panel Lab Results  Component Value Date   GLUCOSE 102 (H) 08/10/2021   NA 140 08/10/2021   K 4.3 08/10/2021   CL 105 08/10/2021   CO2 30 08/10/2021   BUN 19 08/10/2021   CREATININE 0.78 08/10/2021   GFRNONAA 94 07/29/2018   CALCIUM 9.7 08/10/2021   PROT 6.5 08/10/2021   ALBUMIN 4.5 08/10/2021   LABGLOB 1.6 07/29/2018   AGRATIO 3.0 (H) 07/29/2018   BILITOT 0.7 08/10/2021   ALKPHOS 54 08/10/2021   AST 19 08/10/2021   ALT 27 08/10/2021   Last lipids Lab Results  Component Value Date   CHOL 192 08/10/2021   HDL 63.20 08/10/2021   LDLCALC 117 (H) 08/10/2021   LDLDIRECT 133.3 03/11/2012   TRIG 63.0 08/10/2021   CHOLHDL 3 08/10/2021   Last hemoglobin A1c Lab Results  Component Value Date   HGBA1C 5.6 07/29/2018   Last thyroid functions Lab Results  Component Value Date   TSH 2.32 08/10/2021   T3TOTAL 100 02/02/2018   T4TOTAL 8.0 02/06/2021   Last vitamin D Lab Results  Component Value Date   VD25OH 25.05 (L) 02/12/2019   Last vitamin B12 and Folate Lab Results  Component Value Date   VITAMINB12 475 02/02/2018   FOLATE 10.4 02/02/2018      The 10-year ASCVD risk score (Arnett DK, et al., 2019) is: 4.4%    Assessment & Plan:   Problem List Items Addressed This Visit       Unprioritized    RUQ pain - Primary    Check labs and [place] abd If pain worsens , go to ER      Relevant Orders   US Abdomen Limited RUQ (LIVER/GB) (Completed)   CBC with Differential/Platelet   Comprehensive metabolic panel   Amylase   Lipase   H. pylori antibody, IgG    No follow-ups on file.    Ann Held, DO

## 2022-06-07 NOTE — Assessment & Plan Note (Signed)
Check labs and [place] abd If pain worsens , go to ER

## 2022-06-17 ENCOUNTER — Encounter: Payer: Self-pay | Admitting: Family Medicine

## 2022-07-11 IMAGING — MG MM DIGITAL SCREENING BILAT W/ TOMO AND CAD
6 of 10 series · 6 of 30 positions shown · non-contrast
Comparison: Previous exam(s).

CLINICAL DATA: Screening.

EXAM:
DIGITAL SCREENING BILATERAL MAMMOGRAM WITH TOMOSYNTHESIS AND CAD
TECHNIQUE: Bilateral screening digital craniocaudal and mediolateral oblique
mammograms were obtained. Bilateral screening digital breast
tomosynthesis was performed. The images were evaluated with
computer-aided detection.

[R XCCL synth-2D]
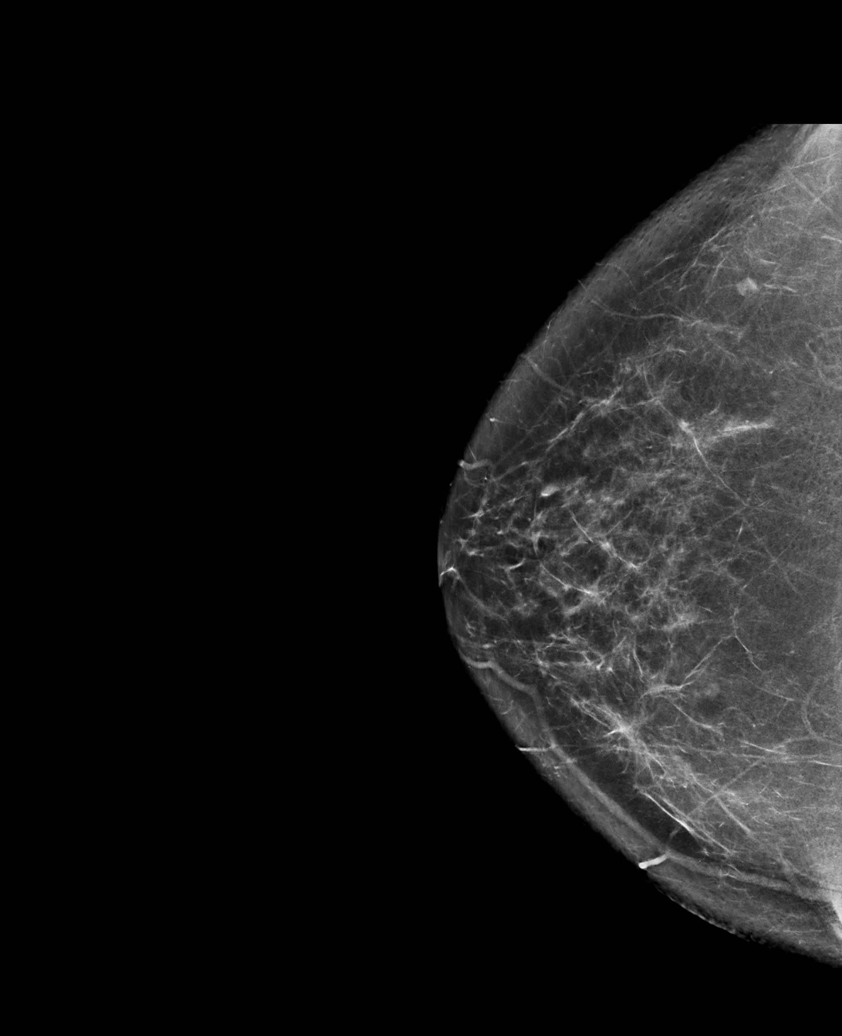

[R MLO synth-2D]
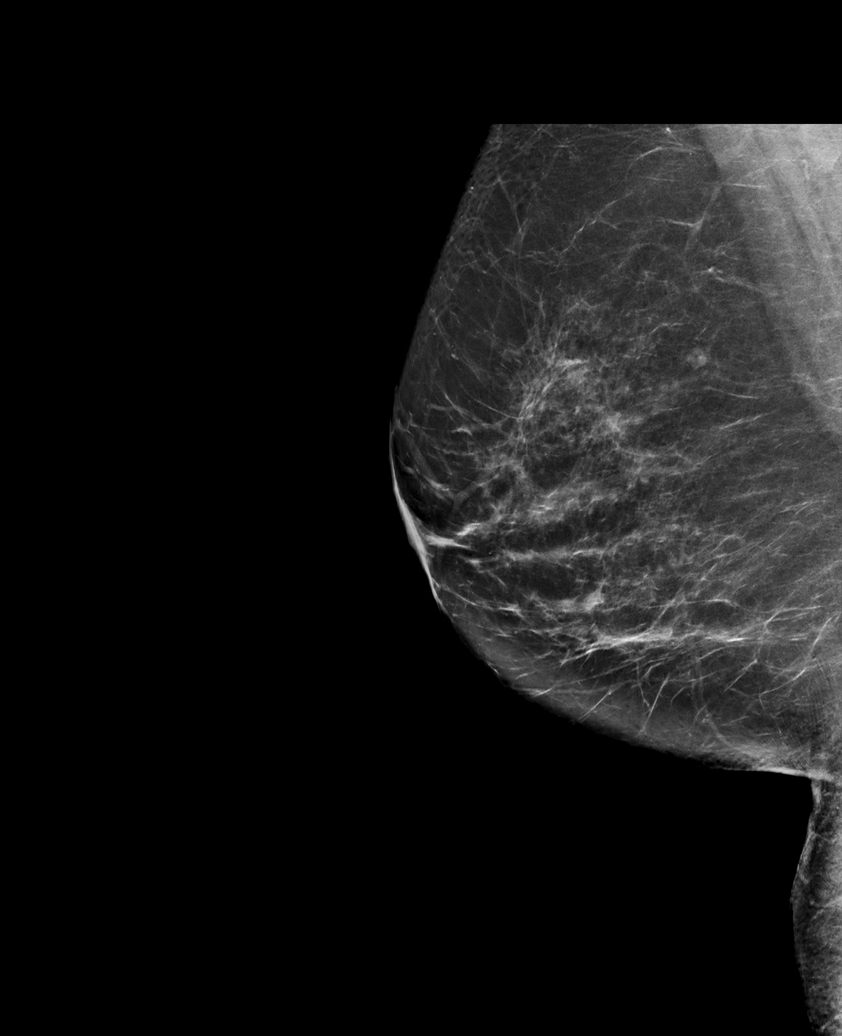

[L MLO synth-2D]
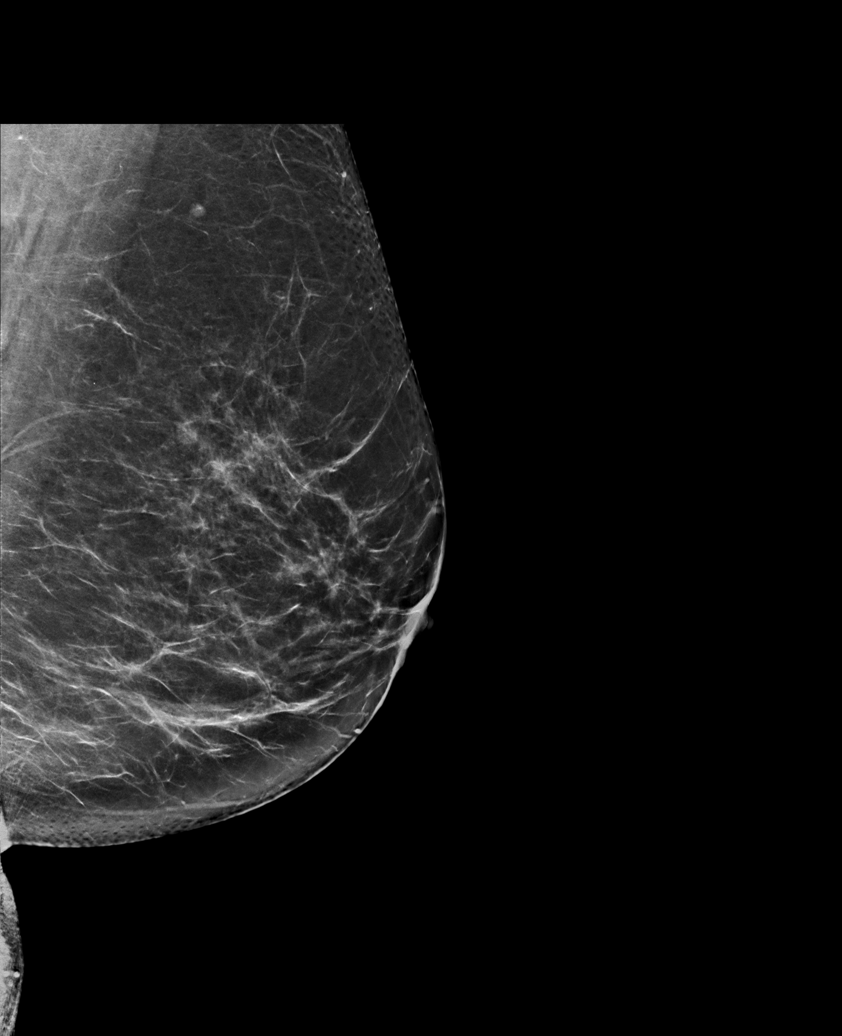

[L CC synth-2D]
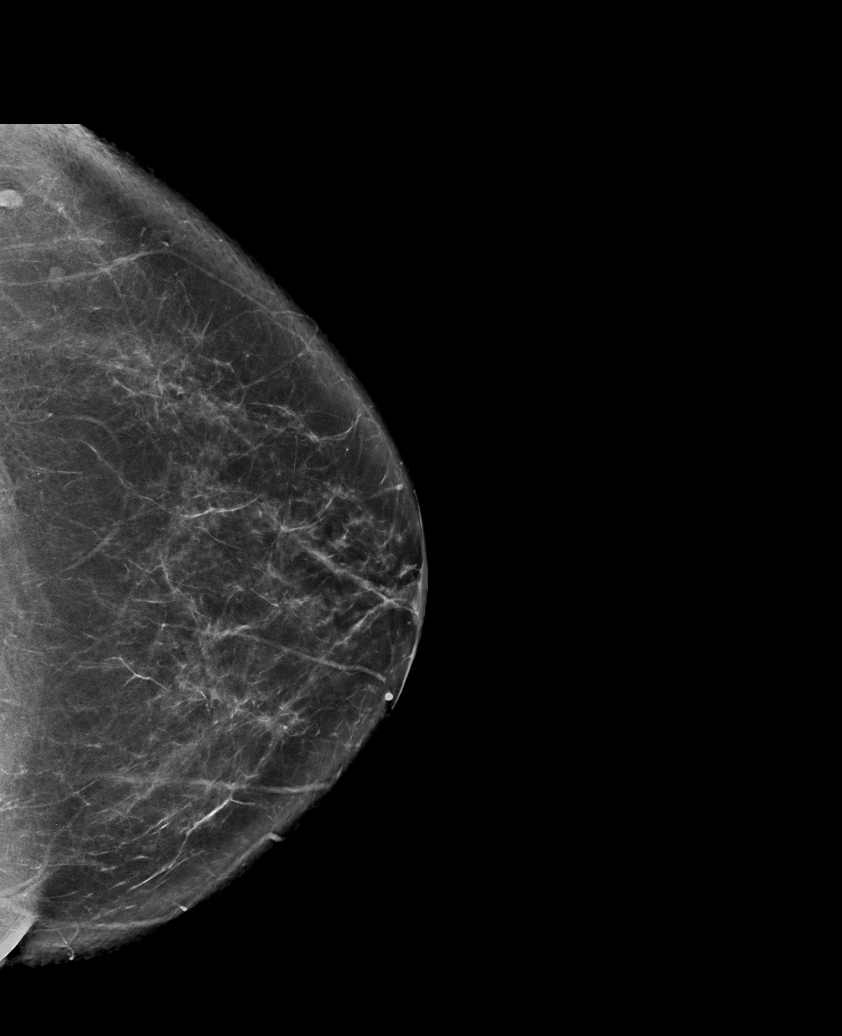

[R CC synth-2D]
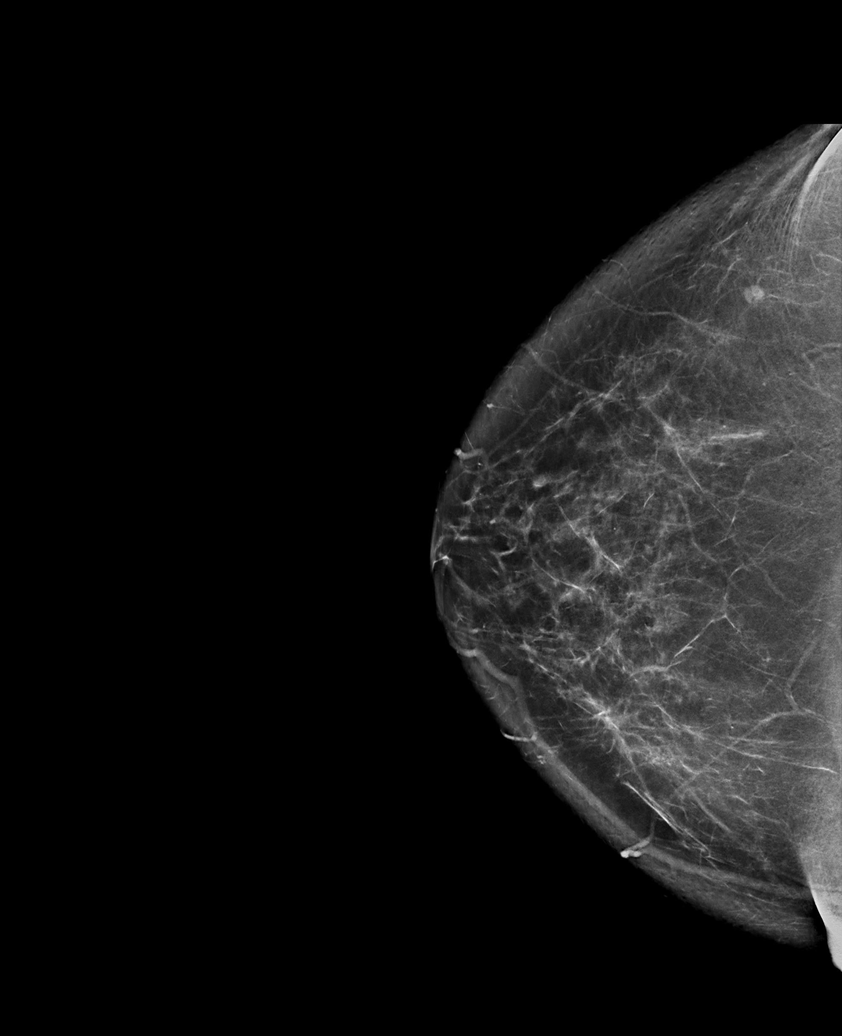

[R XCCL tomo · tomo slice 44/87.0]
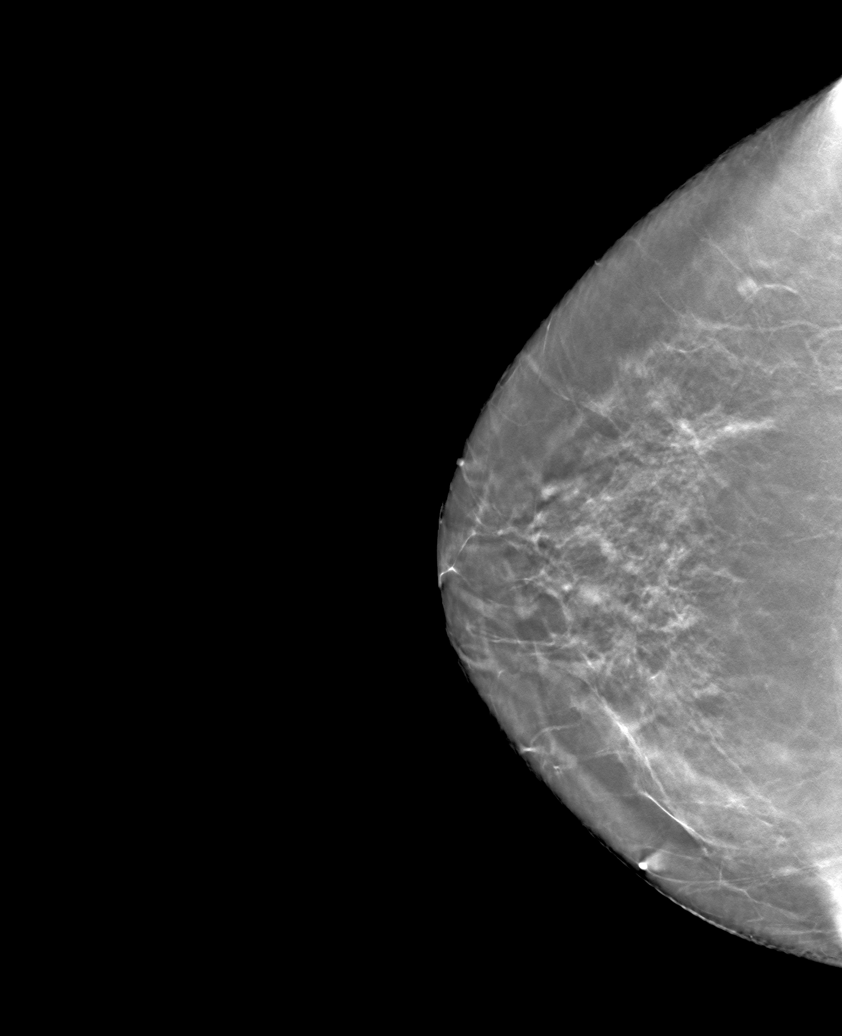

[6 of 30 positions shown; findings below may reference images not displayed]

ACR Breast Density Category b: There are scattered areas of
fibroglandular density.
FINDINGS: There are no findings suspicious for malignancy.
IMPRESSION: No mammographic evidence of malignancy. A result letter of this
screening mammogram will be mailed directly to the patient.

RECOMMENDATION:
Screening mammogram in one year. (Code:51-O-LD2)

BI-RADS CATEGORY  1: Negative.

## 2022-07-24 ENCOUNTER — Other Ambulatory Visit (HOSPITAL_COMMUNITY): Payer: Self-pay

## 2022-08-13 ENCOUNTER — Encounter: Payer: Self-pay | Admitting: Family Medicine

## 2022-08-19 ENCOUNTER — Other Ambulatory Visit (HOSPITAL_BASED_OUTPATIENT_CLINIC_OR_DEPARTMENT_OTHER): Payer: Self-pay

## 2022-08-20 ENCOUNTER — Ambulatory Visit (HOSPITAL_BASED_OUTPATIENT_CLINIC_OR_DEPARTMENT_OTHER): Payer: Self-pay

## 2022-08-20 ENCOUNTER — Other Ambulatory Visit (HOSPITAL_BASED_OUTPATIENT_CLINIC_OR_DEPARTMENT_OTHER): Payer: Self-pay

## 2022-08-20 ENCOUNTER — Other Ambulatory Visit (HOSPITAL_BASED_OUTPATIENT_CLINIC_OR_DEPARTMENT_OTHER): Payer: Self-pay | Admitting: Family Medicine

## 2022-08-20 ENCOUNTER — Encounter: Payer: Self-pay | Admitting: Family Medicine

## 2022-08-20 DIAGNOSIS — Z13820 Encounter for screening for osteoporosis: Secondary | ICD-10-CM

## 2022-08-23 ENCOUNTER — Ambulatory Visit (INDEPENDENT_AMBULATORY_CARE_PROVIDER_SITE_OTHER): Payer: PPO | Admitting: Family Medicine

## 2022-08-23 ENCOUNTER — Encounter: Payer: Self-pay | Admitting: Family Medicine

## 2022-08-23 VITALS — BP 122/80 | HR 57 | Temp 98.5°F | Resp 18 | Ht 70.0 in | Wt 217.4 lb

## 2022-08-23 DIAGNOSIS — E038 Other specified hypothyroidism: Secondary | ICD-10-CM

## 2022-08-23 DIAGNOSIS — F418 Other specified anxiety disorders: Secondary | ICD-10-CM | POA: Diagnosis not present

## 2022-08-23 DIAGNOSIS — Z0000 Encounter for general adult medical examination without abnormal findings: Secondary | ICD-10-CM

## 2022-08-23 DIAGNOSIS — E039 Hypothyroidism, unspecified: Secondary | ICD-10-CM

## 2022-08-23 LAB — CBC WITH DIFFERENTIAL/PLATELET
Basophils Absolute: 0 10*3/uL (ref 0.0–0.1)
Basophils Relative: 1 % (ref 0.0–3.0)
Eosinophils Absolute: 0.1 10*3/uL (ref 0.0–0.7)
Eosinophils Relative: 2.7 % (ref 0.0–5.0)
HCT: 43.1 % (ref 36.0–46.0)
Hemoglobin: 14.6 g/dL (ref 12.0–15.0)
Lymphocytes Relative: 39.6 % (ref 12.0–46.0)
Lymphs Abs: 1.8 10*3/uL (ref 0.7–4.0)
MCHC: 33.8 g/dL (ref 30.0–36.0)
MCV: 91.1 fl (ref 78.0–100.0)
Monocytes Absolute: 0.3 10*3/uL (ref 0.1–1.0)
Monocytes Relative: 7.6 % (ref 3.0–12.0)
Neutro Abs: 2.2 10*3/uL (ref 1.4–7.7)
Neutrophils Relative %: 49.1 % (ref 43.0–77.0)
Platelets: 264 10*3/uL (ref 150.0–400.0)
RBC: 4.73 Mil/uL (ref 3.87–5.11)
RDW: 13.6 % (ref 11.5–15.5)
WBC: 4.5 10*3/uL (ref 4.0–10.5)

## 2022-08-23 LAB — COMPREHENSIVE METABOLIC PANEL
ALT: 59 U/L — ABNORMAL HIGH (ref 0–35)
AST: 36 U/L (ref 0–37)
Albumin: 4.6 g/dL (ref 3.5–5.2)
Alkaline Phosphatase: 65 U/L (ref 39–117)
BUN: 26 mg/dL — ABNORMAL HIGH (ref 6–23)
CO2: 29 mEq/L (ref 19–32)
Calcium: 9.5 mg/dL (ref 8.4–10.5)
Chloride: 103 mEq/L (ref 96–112)
Creatinine, Ser: 0.76 mg/dL (ref 0.40–1.20)
GFR: 81.88 mL/min (ref >60.00)
Glucose, Bld: 87 mg/dL (ref 70–99)
Potassium: 4.8 mEq/L (ref 3.5–5.1)
Sodium: 141 mEq/L (ref 135–145)
Total Bilirubin: 0.5 mg/dL (ref 0.2–1.2)
Total Protein: 6.6 g/dL (ref 6.0–8.3)

## 2022-08-23 LAB — LIPID PANEL
Cholesterol: 210 mg/dL — ABNORMAL HIGH (ref 0–200)
HDL: 60.6 mg/dL (ref >39.00)
LDL Cholesterol: 117 mg/dL — ABNORMAL HIGH (ref 0–99)
NonHDL: 149.66
Total CHOL/HDL Ratio: 3
Triglycerides: 161 mg/dL — ABNORMAL HIGH (ref 0.0–149.0)
VLDL: 32.2 mg/dL (ref 0.0–40.0)

## 2022-08-23 LAB — TSH: TSH: 1.26 u[IU]/mL (ref 0.35–5.50)

## 2022-08-23 NOTE — Assessment & Plan Note (Signed)
Check labs Stable  

## 2022-08-23 NOTE — Progress Notes (Signed)
Subjective:     Michelle Pratt is a 67 y.o. female and is here for a comprehensive physical exam. The patient reports no problems.  Social History   Socioeconomic History   Marital status: Married    Spouse name: [name]   Number of children: 3   Years of education: Not on file   Highest education level: Not on file  Occupational History   Occupation: Teacher, 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[employer]    Comment: pre surgical  Tobacco Use   Smoking status: Former    Packs/day: 0.75    Years: 18.00    Additional pack years: 0.00    Total pack years: 13.50    Types: Cigarettes    Quit date: 08/15/1992    Years since quitting: 30.0   Smokeless tobacco: Never   Tobacco comments:    over 20 years ago  Vaping Use   Vaping Use: Never used  Substance and Sexual Activity   Alcohol use: Yes    Alcohol/week: 5.0 - 7.0 standard drinks of alcohol    Types: 5 - 7 Standard drinks or equivalent per week   Drug use: No   Sexual activity: Yes    Partners: Male  Other Topics Concern   Not on file  Social History Narrative   Exercise--some bike riding   Caffeine Use: daily 1 cup of coffee   Social Determinants of Health   Financial Resource Strain: Not on file  Food Insecurity: Not on file  Transportation Needs: Not on file  Physical Activity: Not on file  Stress: Not on file  Social Connections: Not on file  Intimate Partner Violence: Not on file   Health Maintenance  Topic Date Due   COVID-19 Vaccine (4 - 2023-24 season) 01/11/2022   MAMMOGRAM  09/04/2022   INFLUENZA VACCINE  12/12/2022   Medicare Annual Wellness (AWV)  08/23/2023   DTaP/Tdap/Td (3 - Td or Tdap) 09/04/2024   COLONOSCOPY (Pts 45-50yrs Insurance coverage will need to be confirmed)  04/11/2031   Pneumonia Vaccine 66+ Years old  Completed   DEXA SCAN  Completed   Hepatitis C Screening  Completed   Zoster Vaccines- Shingrix  Completed   HPV VACCINES  Aged Out    The following portions of the patient's history were  reviewed and updated as appropriate: She  has a past medical history of Depression, Glaucoma, Joint pain, Menopause, Thyroid disease, and Vitamin D deficiency. She does not have any pertinent problems on file. She  has a past surgical history that includes Tonsillectomy; Tubal ligation; oraf; ORIF elbow fracture; foot (Left, 2019); and Colonoscopy (05/30/2010). Her family history includes Alcohol abuse in her mother; Cancer (age of onset: 62) in her father; Hyperlipidemia in her father; Hypertension in her father; Parkinsonism in her mother; Stroke in her brother. She  reports that she quit smoking about 30 years ago. Her smoking use included cigarettes. She has a 13.50 pack-year smoking history. She has never used smokeless tobacco. She reports current alcohol use of about 5.0 - 7.0 standard drinks of alcohol per week. She reports that she does not use drugs. She has a current medication list which includes the following prescription(s): acetaminophen, cetirizine, epinephrine, fluoxetine, imiquimod, levothyroxine, melatonin, multivitamin, and omeprazole, and the following Facility-Administered Medications: albuterol. Current Outpatient Medications on File Prior to Visit  Medication Sig Dispense Refill   acetaminophen (TYLENOL) 325 MG tablet Take 650 mg by mouth every 6 (six) hours as needed.     cetirizine (ZYRTEC  ALLERGY) 10 MG tablet Take 1 tablet (10 mg total) by mouth daily. 30 tablet 0   EPINEPHrine 0.3 mg/0.3 mL IJ SOAJ injection Inject 0.3 mg into the muscle once as needed (allergic reaction). 2 each 0   FLUoxetine (PROZAC) 20 MG tablet TAKE 1 TABLET BY MOUTH DAILY. (please make appt for refills) 90 tablet 3   imiquimod (ALDARA) 5 % cream Apply 3 times per week. Leave on for 6-10 hours as directed. 12 each 2   levothyroxine (SYNTHROID) 125 MCG tablet TAKE 1 TABLET BY MOUTH ONCE A DAY 90 tablet 3   Melatonin 3 MG CAPS Take 1 capsule by mouth.     Multiple Vitamin (MULTIVITAMIN) tablet Take 1  tablet by mouth daily.     omeprazole (PRILOSEC) 20 MG capsule Take 1 capsule (20 mg total) by mouth daily. 90 capsule 3   Current Facility-Administered Medications on File Prior to Visit  Medication Dose Route Frequency Provider Last Rate Last Admin   albuterol (PROVENTIL) (2.5 MG/3ML) 0.083% nebulizer solution 2.5 mg  2.5 mg Nebulization Once Olive Bass, FNP       She is allergic to bee venom and morphine..  Review of Systems Review of Systems  Constitutional: Negative for activity change, appetite change and fatigue.  HENT: Negative for hearing loss, congestion, tinnitus and ear discharge.  dentist q31m Eyes: Negative for visual disturbance (see optho q1y -- vision corrected to 20/20 with glasses).  Respiratory: Negative for cough, chest tightness and shortness of breath.   Cardiovascular: Negative for chest pain, palpitations and leg swelling.  Gastrointestinal: Negative for abdominal pain, diarrhea, constipation and abdominal distention.  Genitourinary: Negative for urgency, frequency, decreased urine volume and difficulty urinating.  Musculoskeletal: Negative for back pain, arthralgias and gait problem.  Skin: Negative for color change, pallor and rash.  Neurological: Negative for dizziness, light-headedness, numbness and headaches.  Hematological: Negative for adenopathy. Does not bruise/bleed easily.  Psychiatric/Behavioral: Negative for suicidal ideas, confusion, sleep disturbance, self-injury, dysphoric mood, decreased concentration and agitation.      Objective:    BP 122/80 (BP Location: Left Arm, Patient Position: Sitting, Cuff Size: Large)   Pulse (!) 57   Temp 98.5 F (36.9 C) (Oral)   Resp 18   Ht  (1.778 m)   Wt 217 lb 6.4 oz (98.6 kg)   SpO2 98%   BMI 31.19 kg/m  General appearance: alert, cooperative, appears stated age, and no distress Head: Normocephalic, without obvious abnormality, atraumatic Eyes: conjunctivae/corneas clear. PERRL,  EOM's intact. Fundi benign. Ears: normal TM's and external ear canals both ears Nose: Nares normal. Septum midline. Mucosa normal. No drainage or sinus tenderness. Throat: lips, mucosa, and tongue normal; teeth and gums normal Neck: no adenopathy, no carotid bruit, no JVD, supple, symmetrical, trachea midline, and thyroid not enlarged, symmetric, no tenderness/mass/nodules Back: symmetric, no curvature. ROM normal. No CVA tenderness. Lungs: clear to auscultation bilaterally Heart: regular rate and rhythm, S1, S2 normal, no murmur, click, rub or gallop Abdomen: soft, non-tender; bowel sounds normal; no masses,  no organomegaly Extremities: extremities normal, atraumatic, no cyanosis or edema Pulses: 2+ and symmetric Skin: Skin color, texture, turgor normal. No rashes or lesions Lymph nodes: Cervical, supraclavicular, and axillary nodes normal. Neurologic: Alert and oriented X 3, normal strength and tone. Normal symmetric reflexes. Normal coordination and gait    Assessment:    Healthy female exam.      Plan:    Ghm utd Check labs  See After Visit Summary for Counseling  Recommendations    1. Depression with anxiety stable - CBC with Differential/Platelet - Comprehensive metabolic panel - Lipid panel - TSH  2. Preventative health care See above  - CBC with Differential/Platelet - Comprehensive metabolic panel - Lipid panel - TSH  3. Hypothyroidism, unspecified type Check labs  - TSH  4. Welcome to Medicare preventive visit  - EKG 12-Lead  5. Other specified hypothyroidism

## 2022-08-23 NOTE — Patient Instructions (Signed)
Preventive Care 65 Years and Older, Female Preventive care refers to lifestyle choices and visits with your health care provider that can promote health and wellness. Preventive care visits are also called wellness exams. What can I expect for my preventive care visit? Counseling Your health care provider may ask you questions about your: Medical history, including: Past medical problems. Family medical history. Pregnancy and menstrual history. History of falls. Current health, including: Memory and ability to understand (cognition). Emotional well-being. Home life and relationship well-being. Sexual activity and sexual health. Lifestyle, including: Alcohol, nicotine or tobacco, and drug use. Access to firearms. Diet, exercise, and sleep habits. Work and work environment. Sunscreen use. Safety issues such as seatbelt and bike helmet use. Physical exam Your health care provider will check your: Height and weight. These may be used to calculate your BMI (body mass index). BMI is a measurement that tells if you are at a healthy weight. Waist circumference. This measures the distance around your waistline. This measurement also tells if you are at a healthy weight and may help predict your risk of certain diseases, such as type 2 diabetes and high blood pressure. Heart rate and blood pressure. Body temperature. Skin for abnormal spots. What immunizations do I need?  Vaccines are usually given at various ages, according to a schedule. Your health care provider will recommend vaccines for you based on your age, medical history, and lifestyle or other factors, such as travel or where you work. What tests do I need? Screening Your health care provider may recommend screening tests for certain conditions. This may include: Lipid and cholesterol levels. Hepatitis C test. Hepatitis B test. HIV (human immunodeficiency virus) test. STI (sexually transmitted infection) testing, if you are at  risk. Lung cancer screening. Colorectal cancer screening. Diabetes screening. This is done by checking your blood sugar (glucose) after you have not eaten for a while (fasting). Mammogram. Talk with your health care provider about how often you should have regular mammograms. BRCA-related cancer screening. This may be done if you have a family history of breast, ovarian, tubal, or peritoneal cancers. Bone density scan. This is done to screen for osteoporosis. Talk with your health care provider about your test results, treatment options, and if necessary, the need for more tests. Follow these instructions at home: Eating and drinking  Eat a diet that includes fresh fruits and vegetables, whole grains, lean protein, and low-fat dairy products. Limit your intake of foods with high amounts of sugar, saturated fats, and salt. Take vitamin and mineral supplements as recommended by your health care provider. Do not drink alcohol if your health care provider tells you not to drink. If you drink alcohol: Limit how much you have to 0-1 drink a day. Know how much alcohol is in your drink. In the [place] one drink equals one 12 oz bottle of beer (355 mL), one 5 oz glass of wine (148 mL), or one 1 oz glass of hard liquor (44 mL). Lifestyle Brush your teeth every morning and night with fluoride toothpaste. Floss one time each day. Exercise for at least 30 minutes 5 or more days each week. Do not use any products that contain nicotine or tobacco. These products include cigarettes, chewing tobacco, and vaping devices, such as e-cigarettes. If you need help quitting, ask your health care provider. Do not use drugs. If you are sexually active, practice safe sex. Use a condom or other form of protection in order to prevent STIs. Take aspirin only as told by   your health care provider. Make sure that you understand how much to take and what form to take. Work with your health care provider to find out whether it  is safe and beneficial for you to take aspirin daily. Ask your health care provider if you need to take a cholesterol-lowering medicine (statin). Find healthy ways to manage stress, such as: Meditation, yoga, or listening to music. Journaling. Talking to a trusted person. Spending time with friends and family. Minimize exposure to UV radiation to reduce your risk of skin cancer. Safety Always wear your seat belt while driving or riding in a vehicle. Do not drive: If you have been drinking alcohol. Do not ride with someone who has been drinking. When you are tired or distracted. While texting. If you have been using any mind-altering substances or drugs. Wear a helmet and other protective equipment during sports activities. If you have firearms in your house, make sure you follow all gun safety procedures. What's next? Visit your health care provider once a year for an annual wellness visit. Ask your health care provider how often you should have your eyes and teeth checked. Stay up to date on all vaccines. This information is not intended to replace advice given to you by your health care provider. Make sure you discuss any questions you have with your health care provider. Document Revised: 10/25/2020 Document Reviewed: 10/25/2020 Elsevier Patient Education  2023 [employer]   

## 2022-08-23 NOTE — Assessment & Plan Note (Signed)
Ghm utd Check labs  Health Maintenance  Topic Date Due   Medicare Annual Wellness (AWV)  Never done   COVID-19 Vaccine (4 - 2023-24 season) 01/11/2022   MAMMOGRAM  09/04/2022   INFLUENZA VACCINE  12/12/2022   DTaP/Tdap/Td (3 - Td or Tdap) 09/04/2024   COLONOSCOPY (Pts 45-28yrs Insurance coverage will need to be confirmed)  04/11/2031   Pneumonia Vaccine 49+ Years old  Completed   DEXA SCAN  Completed   Hepatitis C Screening  Completed   Zoster Vaccines- Shingrix  Completed   HPV VACCINES  Aged Out

## 2022-08-28 ENCOUNTER — Inpatient Hospital Stay (HOSPITAL_BASED_OUTPATIENT_CLINIC_OR_DEPARTMENT_OTHER): Admission: RE | Admit: 2022-08-28 | Payer: Self-pay | Source: Ambulatory Visit

## 2022-08-28 ENCOUNTER — Other Ambulatory Visit (HOSPITAL_BASED_OUTPATIENT_CLINIC_OR_DEPARTMENT_OTHER): Payer: Self-pay

## 2022-08-29 ENCOUNTER — Other Ambulatory Visit (HOSPITAL_COMMUNITY): Payer: Self-pay

## 2022-09-02 ENCOUNTER — Other Ambulatory Visit (HOSPITAL_COMMUNITY): Payer: Self-pay

## 2022-09-09 ENCOUNTER — Ambulatory Visit (HOSPITAL_BASED_OUTPATIENT_CLINIC_OR_DEPARTMENT_OTHER)
Admission: RE | Admit: 2022-09-09 | Discharge: 2022-09-09 | Disposition: A | Discharge status: Home / Self Care | Payer: PPO | Source: Ambulatory Visit | Attending: Family Medicine | Admitting: Family Medicine

## 2022-09-09 ENCOUNTER — Encounter (HOSPITAL_BASED_OUTPATIENT_CLINIC_OR_DEPARTMENT_OTHER): Payer: Self-pay

## 2022-09-09 DIAGNOSIS — Z13820 Encounter for screening for osteoporosis: Secondary | ICD-10-CM | POA: Insufficient documentation

## 2022-09-09 DIAGNOSIS — Z8262 Family history of osteoporosis: Secondary | ICD-10-CM | POA: Diagnosis not present

## 2022-09-09 DIAGNOSIS — E039 Hypothyroidism, unspecified: Secondary | ICD-10-CM | POA: Diagnosis not present

## 2022-09-09 DIAGNOSIS — Z780 Asymptomatic menopausal state: Secondary | ICD-10-CM | POA: Diagnosis not present

## 2022-09-09 DIAGNOSIS — M85851 Other specified disorders of bone density and structure, right thigh: Secondary | ICD-10-CM | POA: Diagnosis not present

## 2022-09-09 DIAGNOSIS — Z1231 Encounter for screening mammogram for malignant neoplasm of breast: Secondary | ICD-10-CM | POA: Insufficient documentation

## 2022-10-02 ENCOUNTER — Other Ambulatory Visit: Payer: Self-pay

## 2022-10-02 ENCOUNTER — Other Ambulatory Visit: Payer: Self-pay | Admitting: Family Medicine

## 2022-10-02 ENCOUNTER — Other Ambulatory Visit (HOSPITAL_COMMUNITY): Payer: Self-pay

## 2022-10-02 DIAGNOSIS — F418 Other specified anxiety disorders: Secondary | ICD-10-CM

## 2022-10-02 MED ORDER — FLUOXETINE HCL 20 MG PO CAPS
20.0000 mg | ORAL_CAPSULE | Freq: Every day | ORAL | 3 refills | Status: DC
  Filled 2022-10-02: qty 90, 90d supply, fill #0
  Filled 2023-01-02: qty 90, 90d supply, fill #1
  Filled 2023-03-26: qty 90, 90d supply, fill #2
  Filled 2023-06-30: qty 90, 90d supply, fill #3

## 2022-12-25 ENCOUNTER — Telehealth: Payer: Self-pay | Admitting: Family Medicine

## 2022-12-25 ENCOUNTER — Other Ambulatory Visit: Payer: Self-pay | Admitting: Family Medicine

## 2022-12-25 DIAGNOSIS — R9431 Abnormal electrocardiogram [ECG] [EKG]: Secondary | ICD-10-CM

## 2022-12-25 NOTE — Telephone Encounter (Signed)
Pt states she is trying to get a new life ins but was denied due to a recent EKG. She states the EKG from April showed an "anterior t wave abnormality" and they are requesting she get an echocardiogram and stress test. She wanted to know if she should proceed with that and if anything was a cause for concern. Please advise.

## 2022-12-26 ENCOUNTER — Encounter: Payer: Self-pay | Admitting: Family Medicine

## 2022-12-26 ENCOUNTER — Other Ambulatory Visit: Payer: Self-pay | Admitting: Family Medicine

## 2022-12-26 DIAGNOSIS — R9431 Abnormal electrocardiogram [ECG] [EKG]: Secondary | ICD-10-CM

## 2022-12-26 NOTE — Telephone Encounter (Signed)
Can we order the ECHO or just refer to Cardio?

## 2022-12-26 NOTE — Telephone Encounter (Signed)
See my chart message

## 2023-01-01 ENCOUNTER — Telehealth: Payer: Self-pay | Admitting: Family Medicine

## 2023-01-01 NOTE — Telephone Encounter (Signed)
Pt called & requested to have an order put in for a stress test. She wants to have it done before her visit to the cardiologist. Please advise.

## 2023-01-02 ENCOUNTER — Ambulatory Visit (HOSPITAL_COMMUNITY): Payer: PPO | Attending: Cardiology

## 2023-01-02 ENCOUNTER — Other Ambulatory Visit: Payer: Self-pay | Admitting: Family Medicine

## 2023-01-02 DIAGNOSIS — R9431 Abnormal electrocardiogram [ECG] [EKG]: Secondary | ICD-10-CM | POA: Insufficient documentation

## 2023-01-02 LAB — ECHOCARDIOGRAM COMPLETE
Area-P 1/2: 3.45 cm2
S' Lateral: 2.1 cm

## 2023-01-03 ENCOUNTER — Other Ambulatory Visit (HOSPITAL_COMMUNITY): Payer: Self-pay

## 2023-02-05 ENCOUNTER — Encounter (INDEPENDENT_AMBULATORY_CARE_PROVIDER_SITE_OTHER): Payer: Self-pay | Admitting: Family Medicine

## 2023-02-05 ENCOUNTER — Ambulatory Visit (INDEPENDENT_AMBULATORY_CARE_PROVIDER_SITE_OTHER): Payer: PPO | Admitting: Family Medicine

## 2023-02-05 VITALS — BP 110/64 | HR 52 | Temp 97.7°F | Ht 69.0 in | Wt 214.0 lb

## 2023-02-05 DIAGNOSIS — L719 Rosacea, unspecified: Secondary | ICD-10-CM

## 2023-02-05 DIAGNOSIS — E669 Obesity, unspecified: Secondary | ICD-10-CM

## 2023-02-05 DIAGNOSIS — E782 Mixed hyperlipidemia: Secondary | ICD-10-CM | POA: Diagnosis not present

## 2023-02-05 DIAGNOSIS — R0602 Shortness of breath: Secondary | ICD-10-CM

## 2023-02-05 DIAGNOSIS — L570 Actinic keratosis: Secondary | ICD-10-CM

## 2023-02-05 DIAGNOSIS — F32A Depression, unspecified: Secondary | ICD-10-CM | POA: Insufficient documentation

## 2023-02-05 DIAGNOSIS — D225 Melanocytic nevi of trunk: Secondary | ICD-10-CM | POA: Insufficient documentation

## 2023-02-05 DIAGNOSIS — Z6831 Body mass index (BMI) 31.0-31.9, adult: Secondary | ICD-10-CM

## 2023-02-05 DIAGNOSIS — E039 Hypothyroidism, unspecified: Secondary | ICD-10-CM | POA: Diagnosis not present

## 2023-02-05 DIAGNOSIS — H409 Unspecified glaucoma: Secondary | ICD-10-CM | POA: Insufficient documentation

## 2023-02-05 DIAGNOSIS — K760 Fatty (change of) liver, not elsewhere classified: Secondary | ICD-10-CM

## 2023-02-05 DIAGNOSIS — R739 Hyperglycemia, unspecified: Secondary | ICD-10-CM | POA: Diagnosis not present

## 2023-02-05 DIAGNOSIS — F418 Other specified anxiety disorders: Secondary | ICD-10-CM | POA: Diagnosis not present

## 2023-02-05 DIAGNOSIS — L821 Other seborrheic keratosis: Secondary | ICD-10-CM

## 2023-02-05 DIAGNOSIS — Z780 Asymptomatic menopausal state: Secondary | ICD-10-CM | POA: Insufficient documentation

## 2023-02-05 DIAGNOSIS — E66811 Obesity, class 1: Secondary | ICD-10-CM

## 2023-02-05 DIAGNOSIS — D224 Melanocytic nevi of scalp and neck: Secondary | ICD-10-CM | POA: Insufficient documentation

## 2023-02-05 DIAGNOSIS — R12 Heartburn: Secondary | ICD-10-CM | POA: Insufficient documentation

## 2023-02-05 DIAGNOSIS — M2550 Pain in unspecified joint: Secondary | ICD-10-CM | POA: Insufficient documentation

## 2023-02-05 DIAGNOSIS — K5900 Constipation, unspecified: Secondary | ICD-10-CM | POA: Insufficient documentation

## 2023-02-05 DIAGNOSIS — E559 Vitamin D deficiency, unspecified: Secondary | ICD-10-CM

## 2023-02-05 DIAGNOSIS — R5383 Other fatigue: Secondary | ICD-10-CM

## 2023-02-05 DIAGNOSIS — R011 Cardiac murmur, unspecified: Secondary | ICD-10-CM | POA: Insufficient documentation

## 2023-02-05 DIAGNOSIS — Z808 Family history of malignant neoplasm of other organs or systems: Secondary | ICD-10-CM

## 2023-02-05 DIAGNOSIS — Z0289 Encounter for other administrative examinations: Secondary | ICD-10-CM

## 2023-02-05 HISTORY — DX: Melanocytic nevi of trunk: D22.5

## 2023-02-05 HISTORY — DX: Family history of malignant neoplasm of other organs or systems: Z80.8

## 2023-02-05 HISTORY — DX: Rosacea, unspecified: L71.9

## 2023-02-05 HISTORY — DX: Melanocytic nevi of scalp and neck: D22.4

## 2023-02-05 HISTORY — DX: Other seborrheic keratosis: L82.1

## 2023-02-05 HISTORY — DX: Actinic keratosis: L57.0

## 2023-02-05 NOTE — Progress Notes (Signed)
Chief Complaint:   OBESITY Michelle Pratt (MR# [id number]) is a 67 y.o. female who presents for evaluation and treatment of obesity and related comorbidities. Current BMI is Body mass index is 31.6 kg/m. Michelle Pratt has been struggling with her weight for many years and has been unsuccessful in either losing weight, maintaining weight loss, or reaching her healthy weight goal.  [name] is currently in the action stage of change and ready to dedicate time achieving and maintaining a healthier weight. Michelle Pratt is interested in becoming our patient and working on intensive lifestyle modifications including (but not limited to) diet and exercise for weight loss.  Returning patient- saw Dr. Manson Passey previously.  She stopped coming in early 2020 due to redundancy. She likes experiment and eat different things everyday.  She also enjoys sauces. Retired- patient is really enjoying her retirement. Living at home with her husband [name] (he is a patient and sees [name]).  He is supportive of her and they eat meals together.  She and her husband take turns cooking but he normally cooks. Desired weight is 170- last time she was that weight was when she was 33-87 years old. She previously tried many different "diets" in the past. She is the one doing the grocery shopping.  Skips breakfast daily- due to lack of hunger.  Patient thinks she is emotionally eating everyday.   Food recall: Coffee in the am with milk (2 tbsp) and sugar (1 tbsp) x 2.  May have 2 eggs with 2 pieces of toast or bowl of cereal with milk (special k 1.5 cups and 0.75 cup fairlife milk) around 11:30am.  1pm lunch tomatoes, chickpeas, tuna, cottage cheese (1 tomato, 1 can tuna, 0.25 cup chickpeas, 0.5 cup cottage cheese) or meat and cheese rollups (2 pieces of ham and 1 cheese) and feels satisfied with rollup and full for later mix.  If she doesn't feel full she will graze during the afternoon on fruit, nuts, peanut butter. Dinner is between 6:30- 7pm and  her husband really pushes for protein.  She likes chicken marsala (1 breast) or salmon bites (homemade and about 3oz) or pork medalions (2-3).  Sides are green beans, salad, peas, beets, corn (1 cup).  Feels satisfied from dinner. Tends to snack after dinner if she hasn't eaten enough; popcorn, cheese and crackers.   [name]'s habits were reviewed today and are as follows: Her family eats meals together, her desired weight loss is 44 lbs, she started gaining excessive weight with having children, her heaviest weight ever was 214 pounds, she has significant food cravings issues, she snacks frequently in the evenings, she skips meals frequently, she is frequently drinking liquids with calories, she frequently eats larger portions than normal, and she struggles with emotional eating.  Depression Screen Michelle Pratt's Food and Mood (modified PHQ-9) score was 18.  Subjective:   1. Fatigue, unspecified type [name] admits to daytime somnolence and admits to waking up still tired. Patient has a history of symptoms of daytime fatigue, morning fatigue, and morning headache. [name] generally gets 6 or 8 hours of sleep per night, and states that she has nightime awakenings. Snoring is present. Apneic episodes are not present. Epworth Sleepiness Score is 5.  EKG done in April-sinus bradycardia.  Echo at the end of August was normal.  2. SOB (shortness of breath) [name] notes increasing shortness of breath with exercising and seems to be worsening over time with weight gain. She notes getting out of breath sooner with activity than  she used to. This has not gotten worse recently. [name] denies shortness of breath at rest or orthopnea.  3. Hyperglycemia Patient has a history of prediabetes, but never took medications.  Her last A1c was a few years ago.  4. Mixed hyperlipidemia Patient's last LDL and triglycerides were elevated.  She is not on medications.  Her last labs were done in April 2024.  5.  Hypothyroidism, unspecified type Patient's last TSH was within normal limits.  She is on levothyroxine 125 mcg daily.  6. NAFLD (nonalcoholic fatty liver disease) Patient's diagnosis was seen on an ultrasound in January.  Her last LFTs were within normal limits.  7. Vitamin D deficiency Patient was diagnosed prior and 2020, and she notes fatigue.  8. Depression with anxiety Patient is on Prozac 20 mg, and she denies suicidal or homicidal ideations.  Assessment/Plan:   1. Fatigue, unspecified type Michelle Pratt does feel that her weight is causing her energy to be lower than it should be. Fatigue may be related to obesity, depression or many other causes. Labs will be ordered, and in the meanwhile, [name] will focus on self care including making healthy food choices, increasing physical activity and focusing on stress reduction.  2. SOB (shortness of breath) Michelle Pratt does feel that she gets out of breath more easily that she used to when she exercises. Michelle Pratt's shortness of breath appears to be obesity related and exercise induced. She has agreed to work on weight loss and gradually increase exercise to treat her exercise induced shortness of breath. Will continue to monitor closely.  - CBC w/Diff/Platelet  3. Hyperglycemia We will check labs today, and we will follow-up at patient's next appointment.  - Hemoglobin A1c - Insulin, random  4. Mixed hyperlipidemia We will check labs today, and we will follow-up at patient's next appointment.  - Lipid Panel With LDL/HDL Ratio  5. Hypothyroidism, unspecified type We will check labs today, and we will follow-up at patient's next appointment.  - Thyroid Panel With TSH  6. NAFLD (nonalcoholic fatty liver disease) We will check labs today, and we will follow-up at patient's next appointment.  - Comprehensive metabolic panel  7. Vitamin D deficiency We will check labs today, and we will follow-up at patient's next appointment.  -  VITAMIN D 25 Hydroxy (Vit-D Deficiency, Fractures)  8. Depression with anxiety Patient will continue Prozac, and we will follow-up at her next appointment.  9. Class 1 obesity with serious comorbidity and body mass index (BMI) of 31.0 to 31.9 in adult, unspecified obesity type Ellory is currently in the action stage of change and her goal is to continue with weight loss efforts. I recommend Zeffie begin the structured treatment plan as follows:  She has agreed to the Category 3 Plan and keeping a food journal and adhering to recommended goals of 1500-1600 calories and 90+ grams of protein daily.  Exercise goals: No exercise has been prescribed at this time.   Behavioral modification strategies: increasing lean protein intake, meal planning and cooking strategies, keeping healthy foods in the home, and planning for success.  She was informed of the importance of frequent follow-up visits to maximize her success with intensive lifestyle modifications for her multiple health conditions. She was informed we would discuss her lab results at her next visit unless there is a critical issue that needs to be addressed sooner. Shermeka agreed to keep her next visit at the agreed upon time to discuss these results.  Objective:   Blood pressure 110/64, pulse [name])  52, temperature 97.7 F (36.5 C), height 5\' 9"  (1.753 m), weight 214 lb (97.1 kg), SpO2 96%. Body mass index is 31.6 kg/m.  EKG: Normal sinus rhythm, rate 49 BPM.  Indirect Calorimeter completed today shows a VO2 of 269 and a REE of 1858.  Her calculated basal metabolic rate is 2130 thus her basal metabolic rate is better than expected.  General: Cooperative, alert, well developed, in no acute distress. HEENT: Conjunctivae and lids unremarkable. Cardiovascular: Regular rhythm.  Lungs: Normal work of breathing. Neurologic: No focal deficits.   Lab Results  Component Value Date   CREATININE 0.76 08/23/2022   BUN 26 (H) 08/23/2022   NA  141 08/23/2022   K 4.8 08/23/2022   CL 103 08/23/2022   CO2 29 08/23/2022   Lab Results  Component Value Date   ALT 59 (H) 08/23/2022   AST 36 08/23/2022   ALKPHOS 65 08/23/2022   BILITOT 0.5 08/23/2022   Lab Results  Component Value Date   HGBA1C 5.6 07/29/2018   HGBA1C 5.9 12/17/2017   Lab Results  Component Value Date   INSULIN 7.7 07/29/2018   INSULIN 10.1 02/02/2018   Lab Results  Component Value Date   TSH 1.26 08/23/2022   Lab Results  Component Value Date   CHOL 210 (H) 08/23/2022   HDL 60.60 08/23/2022   LDLCALC 117 (H) 08/23/2022   LDLDIRECT 133.3 03/11/2012   TRIG 161.0 (H) 08/23/2022   CHOLHDL 3 08/23/2022   Lab Results  Component Value Date   WBC 4.5 08/23/2022   HGB 14.6 08/23/2022   HCT 43.1 08/23/2022   MCV 91.1 08/23/2022   PLT 264.0 08/23/2022   No results found for: "IRON", "TIBC", "FERRITIN"  Attestation Statements:   Reviewed by clinician on day of visit: allergies, medications, problem list, medical history, surgical history, family history, social history, and previous encounter notes.  Time spent on visit including pre-visit chart review and post-visit charting and care was 45 minutes.   I, Burt Knack, am acting as transcriptionist for Reuben Likes, MD.  This is the patient's first visit at [facility]. The patient's NEW PATIENT PACKET was reviewed at length. Included in the packet: current and past health history, medications, allergies, ROS, gynecologic history (women only), surgical history, family history, social history, weight history, weight loss surgery history (for those that have had weight loss surgery), nutritional evaluation, mood and food questionnaire, PHQ9, Epworth questionnaire, sleep habits questionnaire, patient life and health improvement goals questionnaire. These will all be scanned into the patient's chart under media.   During the visit, I independently reviewed the patient's EKG,  bioimpedance scale results, and indirect calorimeter results. I used this information to tailor a meal plan for the patient that will help her to lose weight and will improve her obesity-related conditions going forward. I performed a medically necessary appropriate examination and/or evaluation. I discussed the assessment and treatment plan with the patient. The patient was provided an opportunity to ask questions and all were answered. The patient agreed with the plan and demonstrated an understanding of the instructions. Labs were ordered at this visit and will be reviewed at the next visit unless more critical results need to be addressed immediately. Clinical information was updated and documented in the EMR.     I have reviewed the above documentation for accuracy and completeness, and I agree with the above. - Reuben Likes, MD

## 2023-02-06 ENCOUNTER — Ambulatory Visit: Payer: PPO | Attending: Cardiology | Admitting: Cardiology

## 2023-02-06 ENCOUNTER — Encounter: Payer: Self-pay | Admitting: Cardiology

## 2023-02-06 VITALS — BP 102/62 | HR 56 | Ht 69.0 in | Wt 216.1 lb

## 2023-02-06 DIAGNOSIS — R0609 Other forms of dyspnea: Secondary | ICD-10-CM | POA: Diagnosis not present

## 2023-02-06 DIAGNOSIS — R9431 Abnormal electrocardiogram [ECG] [EKG]: Secondary | ICD-10-CM | POA: Diagnosis not present

## 2023-02-06 LAB — LIPID PANEL WITH LDL/HDL RATIO
Cholesterol, Total: 205 mg/dL — ABNORMAL HIGH (ref 100–199)
HDL: 66 mg/dL (ref >39)
LDL Chol Calc (NIH): 117 mg/dL — ABNORMAL HIGH (ref 0–99)
LDL/HDL Ratio: 1.8 ratio (ref 0.0–3.2)
Triglycerides: 125 mg/dL (ref 0–149)
VLDL Cholesterol Cal: 22 mg/dL (ref 5–40)

## 2023-02-06 LAB — COMPREHENSIVE METABOLIC PANEL
ALT: 49 IU/L — ABNORMAL HIGH (ref 0–32)
AST: 25 IU/L (ref 0–40)
Albumin: 4.7 g/dL (ref 3.9–4.9)
Alkaline Phosphatase: 76 IU/L (ref 44–121)
BUN/Creatinine Ratio: 24 (ref 12–28)
BUN: 19 mg/dL (ref 8–27)
Bilirubin Total: 0.5 mg/dL (ref 0.0–1.2)
CO2: 23 mmol/L (ref 20–29)
Calcium: 9.7 mg/dL (ref 8.7–10.3)
Chloride: 102 mmol/L (ref 96–106)
Creatinine, Ser: 0.79 mg/dL (ref 0.57–1.00)
Globulin, Total: 1.8 g/dL (ref 1.5–4.5)
Glucose: 94 mg/dL (ref 70–99)
Potassium: 4.5 mmol/L (ref 3.5–5.2)
Sodium: 142 mmol/L (ref 134–144)
Total Protein: 6.5 g/dL (ref 6.0–8.5)
eGFR: 82 mL/min/{1.73_m2} (ref >59)

## 2023-02-06 LAB — CBC WITH DIFFERENTIAL/PLATELET
Basophils Absolute: 0 10*3/uL (ref 0.0–0.2)
Basos: 1 %
EOS (ABSOLUTE): 0.1 10*3/uL (ref 0.0–0.4)
Eos: 3 %
Hematocrit: 45 % (ref 34.0–46.6)
Hemoglobin: 14.6 g/dL (ref 11.1–15.9)
Immature Grans (Abs): 0 10*3/uL (ref 0.0–0.1)
Immature Granulocytes: 0 %
Lymphocytes Absolute: 1.9 10*3/uL (ref 0.7–3.1)
Lymphs: 41 %
MCH: 30.7 pg (ref 26.6–33.0)
MCHC: 32.4 g/dL (ref 31.5–35.7)
MCV: 95 fL (ref 79–97)
Monocytes Absolute: 0.4 10*3/uL (ref 0.1–0.9)
Monocytes: 9 %
Neutrophils Absolute: 2.2 10*3/uL (ref 1.4–7.0)
Neutrophils: 46 %
Platelets: 248 10*3/uL (ref 150–450)
RBC: 4.76 x10E6/uL (ref 3.77–5.28)
RDW: 12.9 % (ref 11.7–15.4)
WBC: 4.6 10*3/uL (ref 3.4–10.8)

## 2023-02-06 LAB — THYROID PANEL WITH TSH
Free Thyroxine Index: 2.6 (ref 1.2–4.9)
T3 Uptake Ratio: 28 % (ref 24–39)
T4, Total: 9.3 ug/dL (ref 4.5–12.0)
TSH: 1.36 u[IU]/mL (ref 0.450–4.500)

## 2023-02-06 LAB — INSULIN, RANDOM: INSULIN: 15.8 u[IU]/mL (ref 2.6–24.9)

## 2023-02-06 LAB — HEMOGLOBIN A1C
Est. average glucose Bld gHb Est-mCnc: 117 mg/dL
Hgb A1c MFr Bld: 5.7 % — ABNORMAL HIGH (ref 4.8–5.6)

## 2023-02-06 LAB — VITAMIN D 25 HYDROXY (VIT D DEFICIENCY, FRACTURES): Vit D, 25-Hydroxy: 32.8 ng/mL (ref 30.0–100.0)

## 2023-02-06 NOTE — Patient Instructions (Signed)
Medication Instructions:  Your physician recommends that you continue on your current medications as directed. Please refer to the Current Medication list given to you today.  *If you need a refill on your cardiac medications before your next appointment, please call your pharmacy*   Lab Work: None ordered If you have labs (blood work) drawn today and your tests are completely normal, you will receive your results only by: MyChart Message (if you have MyChart) OR A paper copy in the mail If you have any lab test that is abnormal or we need to change your treatment, we will call you to review the results.   Testing/Procedures:  Stress Echocardiogram Instructions:    1. You may take all of your medications.  2. No food, drink or tobacco products four hours prior to your test.  3. Dress prepared to exercise. Best to wear 2 piece outfit and tennis shoes. Shoes must be closed toe.  4. Please bring all current prescription medications.    Follow-Up: At [facility], you and your health needs are our priority.  As part of our continuing mission to provide you with exceptional heart care, we have created designated Provider Care Teams.  These Care Teams include your primary Cardiologist (physician) and Advanced Practice Providers (APPs -  Physician Assistants and Nurse Practitioners) who all work together to provide you with the care you need, when you need it.  We recommend signing up for the patient portal called "MyChart".  Sign up information is provided on this After Visit Summary.  MyChart is used to connect with patients for Virtual Visits (Telemedicine).  Patients are able to view lab/test results, encounter notes, upcoming appointments, etc.  Non-urgent messages can be sent to your provider as well.   To learn more about what you can do with MyChart, go to [email or web address].    Your next appointment:   12 month(s)  The format for your next appointment:   In  Person  Provider:   Belva Crome, MD   Other Instructions Exercise Stress Echocardiogram An exercise stress echocardiogram is a test to check how well your heart is working. This test uses sound waves and a computer to make pictures of your heart. These pictures will be taken before and after you exercise. For this test, you will walk on a treadmill or ride a bicycle to make your heart beat faster. While you exercise, your heart will be checked with an electrocardiogram (ECG). Your blood pressure will also be checked. You may have this test if: You have chest pain or a heart problem. You had a heart attack or heart surgery not long ago. You have heart valve problems. You have a condition that causes narrowing of the blood vessels that supply your heart. You have a high risk of heart disease and: You are starting a new exercise program. You need to have a big surgery. Tell a doctor about: Any allergies you have. All medicines you are taking. This includes vitamins, herbs, eye drops, creams, and over-the-counter medicines. Any problems you or family members have had with medicines that make you fall asleep (anesthetic medicines). Any surgeries you have had. Any blood disorders you have. Any medical conditions you have. Whether you are pregnant or may be pregnant. What are the risks? Generally, this is a safe test. However, problems may occur, including: Chest pain. Feeling dizzy or light-headed. Shortness of breath. Increased or irregular heartbeat. Feeling like you may vomit (nausea) or vomiting. Heart attack. This is very rare.  What happens before the test? Medicines Ask your doctor about changing or stopping your normal medicines. This is important if you take diabetes medicines or blood thinners. If you use an inhaler, bring it to the test. General instructions Wear comfortable clothes and walking shoes. Follow instructions from your doctor about what you cannot eat or  drink before the test. Do not drink or eat anything that has caffeine in it. Stop having caffeine 24 hours before the test. Do not smoke or use products that contain nicotine or tobacco for 4 hours before the test. If you need help quitting, ask your doctor. What happens during the test?  You will take off your clothes from the waist up and put on a hospital gown. Electrodes or patches will be put on your chest. A blood pressure cuff will be put on your arm. Before you exercise, a computer will make a picture of your heart. To do this: You will lie down and a gel will be put on your chest. A wand will be moved over the gel. Sound waves from the wand will go to the computer to make the picture. Then, you will start to exercise. You may walk on a treadmill or pedal a bicycle. Your blood pressure and heart rhythm will be checked while you exercise. The exercise will get harder or faster. You will exercise until: Your heart reaches a certain level. You are too tired to go on. You cannot go on because of chest pain, weakness, or dizziness. You will lie down right away so another picture of your heart can be taken. The procedure may vary among doctors and hospitals. What can I expect after the test? After your test, it is common to have: Mild soreness. Mild tiredness. Your heart rate and blood pressure will be checked until they return to your normal levels. You should not have any new symptoms after this test. Follow these instructions at home: If your doctor says that you can, you may: Eat what you normally eat. Do your normal activities. Take over-the-counter and prescription medicines only as told by your doctor. Keep all follow-up visits. It is up to you to get the results of your test. Ask how to get your results when they are ready. Contact a doctor if: You feel dizzy or light-headed. You have a fast or irregular heartbeat. You feel like you may vomit or you vomit. You have a  headache. You feel short of breath. Get help right away if: You develop pain or pressure: In your chest. In your jaw or neck. Between your shoulders. That goes down your left arm. You faint. You have trouble breathing. These symptoms may be an emergency. Get medical help right away. Call your local emergency services (911 in the [place] Do not wait to see if the symptoms will go away. Do not drive yourself to the hospital. Summary This is a test that checks how well your heart is working. Follow instructions about what you cannot eat or drink before the test. Ask your doctor if you should take your normal medicines before the test. Stop having caffeine 24 hours before the test. Do not smoke or use products with nicotine or tobacco in them for 4 hours before the test. During the test, your blood pressure and heart rhythm will be checked while you exercise. This information is not intended to replace advice given to you by your health care provider. Make sure you discuss any questions you have with your health care  provider. Document Revised: 01/10/2021 Document Reviewed: 12/21/2019 Elsevier Patient Education  2022 [employer].

## 2023-02-06 NOTE — Progress Notes (Signed)
Cardiology Office Note:    Date:  02/06/2023   ID:  GAL CHMIEL, DOB March 22, 1956, MRN [id number]  PCP:  Zola Button, Grayling Congress, DO  Cardiologist:  Garwin Brothers, MD   Referring MD: Zola Button, Grayling Congress, *    ASSESSMENT:    1. Nonspecific abnormal electrocardiogram (ECG) (EKG)   2. DOE (dyspnea on exertion)   3. Dyspnea on exertion    PLAN:    In order of problems listed above:  Primary prevention stressed with the patient.  Importance of compliance with diet medication stressed and patient verbalized standing. Abnormal EKG: EKG reveals poor anterior forces and mentions anterior wall myocardial infarction.  This is not correct as recent echocardiogram has revealed normal left ventricular systolic function and wall motion. Dyspnea on exertion: Overall appears unremarkable.  She does exercise on a regular basis.  But to assess this we will do an exercise stress echo. Mild mitral regurgitation: Medical management. Obesity: Weight reduction stressed diet emphasized.  She promises to do better.  Lipids were reviewed with her and diet emphasized. Further recommendations will be made based on the findings of the aforementioned test.  Patient had multiple questions which were answered to her satisfaction.   Medication Adjustments/Labs and Tests Ordered: Current medicines are reviewed at length with the patient today.  Concerns regarding medicines are outlined above.  Orders Placed This Encounter  Procedures   EKG 12-Lead   ECHOCARDIOGRAM STRESS TEST   No orders of the defined types were placed in this encounter.    History of Present Illness:    Michelle Pratt is a 67 y.o. female who is being seen today for the evaluation of dyspnea on exertion at the request of Zola Button, Grayling Congress, *.  Patient is a pleasant 67 year old female.  She just retired as a Engineer, civil (consulting).  She has no history of hypertension dyslipidemia or diabetes mellitus.  She is an active lady.  She does aerobic  exercises with water aerobics a couple of times a week with this she has no symptoms.  She has some dyspnea on exertion.  She is applying for insurance for life and therefore was also requested this evaluation as her EKG is abnormal.  At the time of my evaluation, the patient is alert awake oriented and in no distress.  Past Medical History:  Diagnosis Date   Acute bacterial sinusitis 05/30/2014   AK (actinic keratosis) 02/05/2023   Allergic rhinitis 12/31/2013   Bunion, right 06/08/2017   Constipation    Cough 12/31/2013   COVID-19 07/10/2021   Cystitis 10/12/2015   Depression    Depression, major, single episode, moderate (HCC) 02/12/2019   Family history of skin cancer 02/05/2023   Fatty liver    Foot pain, bilateral 10/30/2012   Glaucoma    Hallux limitus of left foot 06/08/2017   Heart murmur    Heartburn    History of anaphylaxis 12/31/2013   HOT FLASHES 03/02/2010   Qualifier: Diagnosis of   By: Janit Bern         Hypothyroidism 08/10/2021   Joint pain    Melanocytic nevi of trunk 02/05/2023   Menopause    Morbid obesity (HCC) 03/09/2018   Starting BMI greater then 30     Nevus of scalp 02/05/2023   NONTOXIC MULTINODULAR GOITER 10/20/2009   Qualifier: Diagnosis of   By: Janit Bern         Other specified hypothyroidism 10/20/2009   Qualifier: Diagnosis of  By: Janit Bern         Pain in left knee 03/12/2018   Pain in right knee 03/12/2018   Prediabetes    Preventative health care 08/10/2021   Rosacea 02/05/2023   RUQ pain 06/07/2022   Seborrheic keratosis 02/05/2023   Vaginal laceration 09/13/2014   Vitamin D deficiency     Past Surgical History:  Procedure Laterality Date   COLONOSCOPY  05/30/2010   Dr.Jacobs   foot Left 2019   dr Ardelle Anton-- [facility] ----  first metarsal replacement L foot    oraf     left eyebrow   ORIF ELBOW FRACTURE     TONSILLECTOMY     TUBAL LIGATION      Current Medications: Current Meds  Medication Sig    acetaminophen (TYLENOL) 325 MG tablet Take 650 mg by mouth every 6 (six) hours as needed for mild pain or moderate pain.   cetirizine (ZYRTEC) 10 MG tablet Take 10 mg by mouth as needed for allergies or rhinitis.   EPINEPHrine 0.3 mg/0.3 mL IJ SOAJ injection Inject 0.3 mg into the muscle once as needed (allergic reaction).   FLUoxetine (PROZAC) 20 MG capsule Take 1 capsule (20 mg total) by mouth daily.   levothyroxine (SYNTHROID) 125 MCG tablet TAKE 1 TABLET BY MOUTH ONCE A DAY   Melatonin 3 MG CAPS Take 3 mg by mouth as needed (sleep).   Multiple Vitamin (MULTI VITAMIN PO) Take 1 tablet by mouth daily.   Current Facility-Administered Medications for the 02/06/23 encounter (Office Visit) with Raiyan Dalesandro, Aundra Dubin, MD  Medication   albuterol (PROVENTIL) (2.5 MG/3ML) 0.083% nebulizer solution 2.5 mg     Allergies:   Bee venom and Morphine   Social History   Socioeconomic History   Marital status: Married    Spouse name: [name]   Number of children: 3   Years of education: Not on file   Highest education level: Not on file  Occupational History   Occupation: Teacher, 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[employer]    Comment: pre surgical  Tobacco Use   Smoking status: Former    Current packs/day: 0.00    Average packs/day: 0.8 packs/day for 18.0 years (13.5 ttl pk-yrs)    Types: Cigarettes    Start date: 08/16/1974    Quit date: 08/15/1992    Years since quitting: 30.4   Smokeless tobacco: Never   Tobacco comments:    over 20 years ago  Vaping Use   Vaping status: Never Used  Substance and Sexual Activity   Alcohol use: Yes    Alcohol/week: 5.0 - 7.0 standard drinks of alcohol    Types: 5 - 7 Standard drinks or equivalent per week   Drug use: No   Sexual activity: Yes    Partners: Male  Other Topics Concern   Not on file  Social History Narrative   Exercise--some bike riding   Caffeine Use: daily 1 cup of coffee   Social Determinants of Corporate investment banker Strain: Not on file   Food Insecurity: Not on file  Transportation Needs: Not on file  Physical Activity: Not on file  Stress: Not on file  Social Connections: Not on file     Family History: The patient's family history includes Alcohol abuse in her mother; Cancer (age of onset: 15) in her father; Hyperlipidemia in her father; Hypertension in her father; Parkinsonism in her mother; Stroke in her brother and mother; Thyroid disease in her mother. There  is no history of Colon cancer.  ROS:   Please see the history of present illness.    All other systems reviewed and are negative.  EKGs/Labs/Other Studies Reviewed:    The following studies were reviewed today:  EKG Interpretation Date/Time:  Thursday February 06 2023 08:19:56 EDT Ventricular Rate:  56 PR Interval:  176 QRS Duration:  84 QT Interval:  412 QTC Calculation: 397 R Axis:   -25  Text Interpretation: Sinus bradycardia Anterior infarct , age undetermined No previous ECGs available Confirmed by Belva Crome [phone]) on 02/06/2023 8:35:38 AM     Recent Labs: 02/05/2023: ALT 49; BUN 19; Creatinine, Ser 0.79; Hemoglobin 14.6; Platelets 248; Potassium 4.5; Sodium 142; TSH 1.360  Recent Lipid Panel    Component Value Date/Time   CHOL 205 (H) 02/05/2023 0948   TRIG 125 02/05/2023 0948   HDL 66 02/05/2023 0948   CHOLHDL 3 08/23/2022 1403   VLDL 32.2 08/23/2022 1403   LDLCALC 117 (H) 02/05/2023 0948   LDLDIRECT 133.3 03/11/2012 1112    Physical Exam:    VS:  BP 102/62   Pulse (!) 56   Ht 5\' 9"  (1.753 m)   Wt 216 lb 1.9 oz (98 kg)   SpO2 95%   BMI 31.92 kg/m     Wt Readings from Last 3 Encounters:  02/06/23 216 lb 1.9 oz (98 kg)  02/05/23 214 lb (97.1 kg)  08/23/22 217 lb 6.4 oz (98.6 kg)     GEN: Patient is in no acute distress HEENT: Normal NECK: No JVD; No carotid bruits LYMPHATICS: No lymphadenopathy CARDIAC: S1 S2 regular, 2/6 systolic murmur at the apex. RESPIRATORY:  Clear to auscultation without rales, wheezing or  rhonchi  ABDOMEN: Soft, non-tender, non-distended MUSCULOSKELETAL:  No edema; No deformity  SKIN: Warm and dry NEUROLOGIC:  Alert and oriented x 3 PSYCHIATRIC:  Normal affect    Signed, Garwin Brothers, MD  02/06/2023 8:58 AM    [facility]

## 2023-02-19 ENCOUNTER — Encounter (INDEPENDENT_AMBULATORY_CARE_PROVIDER_SITE_OTHER): Payer: Self-pay | Admitting: Family Medicine

## 2023-02-19 ENCOUNTER — Ambulatory Visit (INDEPENDENT_AMBULATORY_CARE_PROVIDER_SITE_OTHER): Payer: PPO | Admitting: Family Medicine

## 2023-02-19 VITALS — BP 121/71 | HR 54 | Temp 97.7°F | Ht 69.0 in | Wt 211.0 lb

## 2023-02-19 DIAGNOSIS — R7303 Prediabetes: Secondary | ICD-10-CM | POA: Diagnosis not present

## 2023-02-19 DIAGNOSIS — E7849 Other hyperlipidemia: Secondary | ICD-10-CM | POA: Diagnosis not present

## 2023-02-19 DIAGNOSIS — E66811 Obesity, class 1: Secondary | ICD-10-CM

## 2023-02-19 DIAGNOSIS — E669 Obesity, unspecified: Secondary | ICD-10-CM

## 2023-02-19 DIAGNOSIS — E559 Vitamin D deficiency, unspecified: Secondary | ICD-10-CM | POA: Diagnosis not present

## 2023-02-19 DIAGNOSIS — Z6831 Body mass index (BMI) 31.0-31.9, adult: Secondary | ICD-10-CM

## 2023-02-19 MED ORDER — VITAMIN D3 125 MCG (5000 UT) PO CAPS
5000.0000 [IU] | ORAL_CAPSULE | Freq: Every day | ORAL | Status: AC
Start: 2023-02-19 — End: ?

## 2023-02-19 NOTE — Progress Notes (Signed)
Chief Complaint:   OBESITY Michelle Pratt is here to discuss her progress with her obesity treatment plan along with follow-up of her obesity related diagnoses. Michelle Pratt is on the Category 3 Plan and keeping a food journal and adhering to recommended goals of 1500-1600 calories and 90+ grams of protein and states she is following her eating plan approximately 50% of the time. Michelle Pratt states she is doing water aerobics and walking 1 miles for 60 minutes 2-3 times per week.  Today's visit was #: 2 Starting weight: 214 lbs Starting date: 02/05/2023 Today's weight: 211 lbs Today's date: 02/19/2023 Total lbs lost to date: 3 Total lbs lost since last in-office visit: 3  Interim History: First several days she ignored the plan completely.  She tried starting to journaling and then felt like she wasn't hitting the goals.  She mentions they went out to eat and had avocado.  She has been trying to go thru her mother in laws things and accidentally skipped meals.  Doesn't have any traveling coming up except a wedding.  So next few weeks she will be able to more consistently oscillate between Category 3 and journaling.  She recognizes she has a few more weeks until holidays.  Subjective:   1. Other hyperlipidemia Patient's recent LDL was 117, HDL 66, and triglycerides 161.  She is not on medications.  Patient has limited other medical comorbidities.  2. Vitamin D deficiency Patient is not on vitamin D, and she notes fatigue.  Her recent vitamin D level was of 32.8.  3. Prediabetes Patient was diagnosed at least 5 years ago.  She is not on medications, and she still does have cravings.  Assessment/Plan:   1. Other hyperlipidemia Patient will continue her category plan with the option to journal.  2. Vitamin D deficiency Patient agreed to start vitamin D 5000 IU daily OTC.  - Cholecalciferol (VITAMIN D3) 125 MCG (5000 UT) CAPS; Take 1 capsule (5,000 Units total) by mouth daily.  3.  Prediabetes Pathophysiology of insulin resistance, prediabetes, and diabetes mellitus were discussed with the patient today.  Patient is to aim to 50% calories, 30% protein, and 20% fat when journaling or following her Category 3 plan.  4. BMI 31.0-31.9,adult  5. Obesity with starting BMI of 31.6 Michelle Pratt is currently in the action stage of change. As such, her goal is to continue with weight loss efforts. She has agreed to the Category 3 Plan and keeping a food journal and adhering to recommended goals of 1500-1600 calories and 90+ grams of protein daily.   Exercise goals: All adults should avoid inactivity. Some physical activity is better than none, and adults who participate in any amount of physical activity gain some health benefits.  Behavioral modification strategies: increasing lean protein intake, meal planning and cooking strategies, keeping healthy foods in the home, and planning for success.  Michelle Pratt has agreed to follow-up with our clinic in 2 to 3 weeks. She was informed of the importance of frequent follow-up visits to maximize her success with intensive lifestyle modifications for her multiple health conditions.   Objective:   Blood pressure 121/71, pulse (!) 54, temperature 97.7 F (36.5 C), height 5\' 9"  (1.753 m), weight 211 lb (95.7 kg), SpO2 98%. Body mass index is 31.16 kg/m.  General: Cooperative, alert, well developed, in no acute distress. HEENT: Conjunctivae and lids unremarkable. Cardiovascular: Regular rhythm.  Lungs: Normal work of breathing. Neurologic: No focal deficits.   Lab Results  Component Value Date  CREATININE 0.79 02/05/2023   BUN 19 02/05/2023   NA 142 02/05/2023   K 4.5 02/05/2023   CL 102 02/05/2023   CO2 23 02/05/2023   Lab Results  Component Value Date   ALT 49 (H) 02/05/2023   AST 25 02/05/2023   ALKPHOS 76 02/05/2023   BILITOT 0.5 02/05/2023   Lab Results  Component Value Date   HGBA1C 5.7 (H) 02/05/2023   HGBA1C 5.6  07/29/2018   HGBA1C 5.9 12/17/2017   Lab Results  Component Value Date   INSULIN 15.8 02/05/2023   INSULIN 7.7 07/29/2018   INSULIN 10.1 02/02/2018   Lab Results  Component Value Date   TSH 1.360 02/05/2023   Lab Results  Component Value Date   CHOL 205 (H) 02/05/2023   HDL 66 02/05/2023   LDLCALC 117 (H) 02/05/2023   LDLDIRECT 133.3 03/11/2012   TRIG 125 02/05/2023   CHOLHDL 3 08/23/2022   Lab Results  Component Value Date   VD25OH 32.8 02/05/2023   VD25OH 25.05 (L) 02/12/2019   VD25OH 22.9 (L) 07/29/2018   Lab Results  Component Value Date   WBC 4.6 02/05/2023   HGB 14.6 02/05/2023   HCT 45.0 02/05/2023   MCV 95 02/05/2023   PLT 248 02/05/2023   No results found for: "IRON", "TIBC", "FERRITIN"  Attestation Statements:   Reviewed by clinician on day of visit: allergies, medications, problem list, medical history, surgical history, family history, social history, and previous encounter notes.  Time spent on visit including pre-visit chart review and post-visit care and charting was 45 minutes.   I, Burt Knack, am acting as transcriptionist for Reuben Likes, MD.  I have reviewed the above documentation for accuracy and completeness, and I agree with the above. - Reuben Likes, MD

## 2023-02-26 ENCOUNTER — Telehealth (HOSPITAL_COMMUNITY): Payer: Self-pay | Admitting: *Deleted

## 2023-02-26 NOTE — Telephone Encounter (Signed)
Left message on voicemail per DPR in reference to upcoming appointment scheduled on 03/03/2023 at 2:30 with detailed instructions given per Stress Test Requisition Sheet for the test. LM to arrive 30 minutes early, and that it is imperative to arrive on time for appointment to keep from having the test rescheduled. If you need to cancel or reschedule your appointment, please call the office within 24 hours of your appointment. Failure to do so may result in a cancellation of your appointment, and a $50 no show fee. Phone number given for call back for any questions. Daneil Dolin

## 2023-03-03 ENCOUNTER — Ambulatory Visit (HOSPITAL_COMMUNITY): Payer: PPO | Attending: Cardiology

## 2023-03-03 ENCOUNTER — Ambulatory Visit (HOSPITAL_COMMUNITY): Payer: PPO

## 2023-03-03 DIAGNOSIS — R9431 Abnormal electrocardiogram [ECG] [EKG]: Secondary | ICD-10-CM | POA: Diagnosis not present

## 2023-03-03 DIAGNOSIS — R0609 Other forms of dyspnea: Secondary | ICD-10-CM

## 2023-03-03 MED ORDER — PERFLUTREN LIPID MICROSPHERE
1.0000 mL | INTRAVENOUS | Status: AC | PRN
Start: 2023-03-03 — End: 2023-03-03
  Administered 2023-03-03: 7 mL via INTRAVENOUS

## 2023-03-05 ENCOUNTER — Encounter (INDEPENDENT_AMBULATORY_CARE_PROVIDER_SITE_OTHER): Payer: Self-pay | Admitting: Family Medicine

## 2023-03-05 ENCOUNTER — Ambulatory Visit (INDEPENDENT_AMBULATORY_CARE_PROVIDER_SITE_OTHER): Payer: PPO | Admitting: Family Medicine

## 2023-03-05 ENCOUNTER — Other Ambulatory Visit (HOSPITAL_COMMUNITY): Payer: Self-pay

## 2023-03-05 VITALS — BP 116/73 | HR 59 | Temp 97.9°F | Ht 69.0 in | Wt 213.0 lb

## 2023-03-05 DIAGNOSIS — E669 Obesity, unspecified: Secondary | ICD-10-CM

## 2023-03-05 DIAGNOSIS — F5089 Other specified eating disorder: Secondary | ICD-10-CM

## 2023-03-05 DIAGNOSIS — E559 Vitamin D deficiency, unspecified: Secondary | ICD-10-CM | POA: Diagnosis not present

## 2023-03-05 DIAGNOSIS — E66811 Obesity, class 1: Secondary | ICD-10-CM

## 2023-03-05 DIAGNOSIS — Z6831 Body mass index (BMI) 31.0-31.9, adult: Secondary | ICD-10-CM

## 2023-03-05 MED ORDER — INFLUENZA VAC A&B SURF ANT ADJ 0.5 ML IM SUSY
0.5000 mL | PREFILLED_SYRINGE | INTRAMUSCULAR | 0 refills | Status: AC
  Filled 2023-03-05: qty 0.5, 1d supply, fill #0

## 2023-03-05 MED ORDER — BUPROPION HCL ER (XL) 150 MG PO TB24
150.0000 mg | ORAL_TABLET | Freq: Every day | ORAL | 0 refills | Status: DC
Start: 2023-03-05 — End: 2023-03-26
  Filled 2023-03-05: qty 30, 30d supply, fill #0

## 2023-03-05 NOTE — Progress Notes (Signed)
Chief Complaint:   OBESITY Michelle Pratt is here to discuss her progress with her obesity treatment plan along with follow-up of her obesity related diagnoses. Michelle Pratt is on the Category 3 Plan and keeping a food journal and adhering to recommended goals of 1500-1600 calories and 90+ grams of protein and states she is following her eating plan approximately 75% of the time. Michelle Pratt states she is doing water aerobics for 60 minutes 2 times per week.  Today's visit was #: 3 Starting weight: 214 lbs Starting date: 02/05/2023 Today's weight: 213 lbs Today's date: 03/05/2023 Total lbs lost to date: 1 Total lbs lost since last in-office visit: 0  Interim History: Patient feels like she is following the meal plan stricter during the day and then has had some luncheons and weddings.  Feels like the biggest difficulty is afternoon and dinner eating.  She is eating dinner later. She feels somewhat frustrated with lack of consistent weight loss.  She is hosting a 1920s murder mystery dinner this weekend.  The weekend after she is going camping and is planning to eat a cookout. Wondering about making a substitution for the bread on the plan.   Subjective:   1. Vitamin D deficiency Patient is not on prescription vitamin D, but she is on OTC vitamin D and she notes fatigue.  2. Other disorder of eating Patient's blood pressure is controlled today.  She denies a history of seizures.  Assessment/Plan:   1. Vitamin D deficiency Patient will continue OTC vitamin D with no change in treatment.  We will repeat labs in 3 months.  2. Other disorder of eating Patient agreed to start Wellbutrin XL 150 mg once daily with no refills.  We will follow-up at her next appointment.  - buPROPion (WELLBUTRIN XL) 150 MG 24 hr tablet; Take 1 tablet (150 mg total) by mouth daily.  Dispense: 30 tablet; Refill: 0  3. BMI 31.0-31.9,adult  4. Obesity with starting BMI of 31.6 Michelle Pratt is currently in the action stage of  change. As such, her goal is to continue with weight loss efforts. She has agreed to the Category 3 Plan and keeping a food journal and adhering to recommended goals of 1500-1600 calories and 90+ grams of protein daily.   Exercise goals: All adults should avoid inactivity. Some physical activity is better than none, and adults who participate in any amount of physical activity gain some health benefits.  Behavioral modification strategies: increasing lean protein intake, meal planning and cooking strategies, keeping healthy foods in the home, and planning for success.  Michelle Pratt has agreed to follow-up with our clinic in 2 to 3 weeks. She was informed of the importance of frequent follow-up visits to maximize her success with intensive lifestyle modifications for her multiple health conditions.   Objective:   Blood pressure 116/73, pulse (!) 59, temperature 97.9 F (36.6 C), height 5\' 9"  (1.753 m), weight 213 lb (96.6 kg), SpO2 97%. Body mass index is 31.45 kg/m.  General: Cooperative, alert, well developed, in no acute distress. HEENT: Conjunctivae and lids unremarkable. Cardiovascular: Regular rhythm.  Lungs: Normal work of breathing. Neurologic: No focal deficits.   Lab Results  Component Value Date   CREATININE 0.79 02/05/2023   BUN 19 02/05/2023   NA 142 02/05/2023   K 4.5 02/05/2023   CL 102 02/05/2023   CO2 23 02/05/2023   Lab Results  Component Value Date   ALT 49 (H) 02/05/2023   AST 25 02/05/2023   ALKPHOS 76  02/05/2023   BILITOT 0.5 02/05/2023   Lab Results  Component Value Date   HGBA1C 5.7 (H) 02/05/2023   HGBA1C 5.6 07/29/2018   HGBA1C 5.9 12/17/2017   Lab Results  Component Value Date   INSULIN 15.8 02/05/2023   INSULIN 7.7 07/29/2018   INSULIN 10.1 02/02/2018   Lab Results  Component Value Date   TSH 1.360 02/05/2023   Lab Results  Component Value Date   CHOL 205 (H) 02/05/2023   HDL 66 02/05/2023   LDLCALC 117 (H) 02/05/2023   LDLDIRECT 133.3  03/11/2012   TRIG 125 02/05/2023   CHOLHDL 3 08/23/2022   Lab Results  Component Value Date   VD25OH 32.8 02/05/2023   VD25OH 25.05 (L) 02/12/2019   VD25OH 22.9 (L) 07/29/2018   Lab Results  Component Value Date   WBC 4.6 02/05/2023   HGB 14.6 02/05/2023   HCT 45.0 02/05/2023   MCV 95 02/05/2023   PLT 248 02/05/2023   No results found for: "IRON", "TIBC", "FERRITIN"  Attestation Statements:   Reviewed by clinician on day of visit: allergies, medications, problem list, medical history, surgical history, family history, social history, and previous encounter notes.   I, Burt Knack, am acting as transcriptionist for Reuben Likes, MD.  I have reviewed the above documentation for accuracy and completeness, and I agree with the above. - Reuben Likes, MD

## 2023-03-18 ENCOUNTER — Telehealth (INDEPENDENT_AMBULATORY_CARE_PROVIDER_SITE_OTHER): Payer: PPO | Admitting: Family Medicine

## 2023-03-18 ENCOUNTER — Encounter: Payer: Self-pay | Admitting: Family Medicine

## 2023-03-18 ENCOUNTER — Other Ambulatory Visit (HOSPITAL_COMMUNITY): Payer: Self-pay

## 2023-03-18 DIAGNOSIS — J440 Chronic obstructive pulmonary disease with acute lower respiratory infection: Secondary | ICD-10-CM | POA: Diagnosis not present

## 2023-03-18 DIAGNOSIS — J209 Acute bronchitis, unspecified: Secondary | ICD-10-CM

## 2023-03-18 MED ORDER — AZITHROMYCIN 250 MG PO TABS
ORAL_TABLET | ORAL | 0 refills | Status: DC
Start: 2023-03-18 — End: 2023-03-26
  Filled 2023-03-18: qty 6, 5d supply, fill #0

## 2023-03-18 MED ORDER — PROMETHAZINE-DM 6.25-15 MG/5ML PO SYRP
5.0000 mL | ORAL_SOLUTION | Freq: Four times a day (QID) | ORAL | 0 refills | Status: DC | PRN
Start: 2023-03-18 — End: 2023-03-26
  Filled 2023-03-18: qty 118, 6d supply, fill #0

## 2023-03-18 MED ORDER — PREDNISONE 10 MG PO TABS
ORAL_TABLET | ORAL | 0 refills | Status: DC
  Filled 2023-03-18: qty 20, 12d supply, fill #0

## 2023-03-18 NOTE — Progress Notes (Signed)
MyChart Video Visit    Virtual Visit via Video Note   This patient is at least at moderate risk for complications without adequate follow up. This format is felt to be most appropriate for this patient at this time. Physical exam was limited by quality of the video and audio technology used for the visit. Michelle Pratt was able to get the patient set up on a video visit.  Patient location: home Patient and provider in visit Provider location: Office  I discussed the limitations of evaluation and management by telemedicine and the availability of in person appointments. The patient expressed understanding and agreed to proceed.  Visit Date: 03/18/2023  Today's healthcare provider: Donato Schultz, DO     Subjective:    Patient ID: Michelle Pratt, female    DOB: [birth year], 67 y.o.   MRN: [id number]  Chief Complaint  Patient presents with   Cough    Productive cough, worse at night, sxs started over the weekend    HPI Patient is in today for cough Discussed the use of AI scribe software for clinical note transcription with the patient, who gave verbal consent to proceed.  History of Present Illness   The patient presents with bronchitis, which she believes was contracted from her husband. Symptoms began on Saturday, with initial signs of a cold that progressed as her husband's condition improved. She describes a feeling of tightness in the chest, but denies wheezing. She also reports some sinus pressure, which she has been managing with Zyrtec. The patient's husband was previously diagnosed with bronchitis and tested negative for COVID-19. The patient has leftover cough syrup, but is unsure of its age. She expresses a desire for a Z-Pak, as it seemed to help her husband's condition.       Past Medical History:  Diagnosis Date   Acute bacterial sinusitis 05/30/2014   AK (actinic keratosis) 02/05/2023   Allergic rhinitis 12/31/2013   Bunion, right 06/08/2017   Constipation     Cough 12/31/2013   COVID-19 07/10/2021   Cystitis 10/12/2015   Depression    Depression, major, single episode, moderate (HCC) 02/12/2019   Family history of skin cancer 02/05/2023   Fatty liver    Foot pain, bilateral 10/30/2012   Glaucoma    Hallux limitus of left foot 06/08/2017   Heart murmur    Heartburn    History of anaphylaxis 12/31/2013   HOT FLASHES 03/02/2010   Qualifier: Diagnosis of   By: Janit Bern         Hypothyroidism 08/10/2021   Joint pain    Melanocytic nevi of trunk 02/05/2023   Menopause    Morbid obesity (HCC) 03/09/2018   Starting BMI greater then 30     Nevus of scalp 02/05/2023   NONTOXIC MULTINODULAR GOITER 10/20/2009   Qualifier: Diagnosis of   By: Janit Bern         Other specified hypothyroidism 10/20/2009   Qualifier: Diagnosis of   By: Janit Bern         Pain in left knee 03/12/2018   Pain in right knee 03/12/2018   Prediabetes    Preventative health care 08/10/2021   Rosacea 02/05/2023   RUQ pain 06/07/2022   Seborrheic keratosis 02/05/2023   Vaginal laceration 09/13/2014   Vitamin D deficiency     Past Surgical History:  Procedure Laterality Date   COLONOSCOPY  05/30/2010   Dr.Jacobs   foot Left 2019   dr Ardelle Anton-- [facility] ----  first metarsal replacement L foot    oraf     left eyebrow   ORIF ELBOW FRACTURE     TONSILLECTOMY     TUBAL LIGATION      Family History  Problem Relation Age of Onset   Thyroid disease Mother    Stroke Mother    Alcohol abuse Mother    Parkinsonism Mother    Hypertension Father    Cancer Father 47       prostate   Hyperlipidemia Father    Stroke Brother    Colon cancer Neg Hx     Social History   Socioeconomic History   Marital status: Married    Spouse name: [name]   Number of children: 3   Years of education: Not on file   Highest education level: Not on file  Occupational History   Occupation: Teacher, 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[employer]    Comment: pre surgical   Tobacco Use   Smoking status: Former    Current packs/day: 0.00    Average packs/day: 0.8 packs/day for 18.0 years (13.5 ttl pk-yrs)    Types: Cigarettes    Start date: 08/16/1974    Quit date: 08/15/1992    Years since quitting: 30.6   Smokeless tobacco: Never   Tobacco comments:    over 20 years ago  Vaping Use   Vaping status: Never Used  Substance and Sexual Activity   Alcohol use: Yes    Alcohol/week: 5.0 - 7.0 standard drinks of alcohol    Types: 5 - 7 Standard drinks or equivalent per week   Drug use: No   Sexual activity: Yes    Partners: Male  Other Topics Concern   Not on file  Social History Narrative   Exercise--some bike riding   Caffeine Use: daily 1 cup of coffee   Social Determinants of Health   Financial Resource Strain: Not on file  Food Insecurity: Not on file  Transportation Needs: Not on file  Physical Activity: Not on file  Stress: Not on file  Social Connections: Not on file  Intimate Partner Violence: Not on file    Outpatient Medications Prior to Visit  Medication Sig Dispense Refill   acetaminophen (TYLENOL) 325 MG tablet Take 650 mg by mouth every 6 (six) hours as needed for mild pain or moderate pain.     buPROPion (WELLBUTRIN XL) 150 MG 24 hr tablet Take 1 tablet (150 mg total) by mouth daily. 30 tablet 0   cetirizine (ZYRTEC) 10 MG tablet Take 10 mg by mouth as needed for allergies or rhinitis.     Cholecalciferol (VITAMIN D3) 125 MCG (5000 UT) CAPS Take 1 capsule (5,000 Units total) by mouth daily.     EPINEPHrine 0.3 mg/0.3 mL IJ SOAJ injection Inject 0.3 mg into the muscle once as needed (allergic reaction). 2 each 0   FLUoxetine (PROZAC) 20 MG capsule Take 1 capsule (20 mg total) by mouth daily. 90 capsule 3   Melatonin 3 MG CAPS Take 3 mg by mouth as needed (sleep).     Multiple Vitamin (MULTI VITAMIN PO) Take 1 tablet by mouth daily.     levothyroxine (SYNTHROID) 125 MCG tablet TAKE 1 TABLET BY MOUTH ONCE A DAY 90 tablet 3    Facility-Administered Medications Prior to Visit  Medication Dose Route Frequency Provider Last Rate Last Admin   albuterol (PROVENTIL) (2.5 MG/3ML) 0.083% nebulizer solution 2.5 mg  2.5 mg Nebulization Once Olive Bass, FNP  Allergies  Allergen Reactions   Bee Venom Anaphylaxis and Swelling   Morphine Nausea And Vomiting    Review of Systems  Constitutional:  Positive for chills and malaise/fatigue. Negative for fever.  HENT:  Positive for congestion and sinus pain. Negative for ear discharge, ear pain and sore throat.   Respiratory:  Positive for cough. Negative for sputum production and wheezing.        Objective:    Physical Exam Vitals and nursing note reviewed.  Constitutional:      Appearance: She is ill-appearing.  Pulmonary:     Effort: Pulmonary effort is normal.     Breath sounds: Wheezing present.     There were no vitals taken for this visit. Wt Readings from Last 3 Encounters:  03/05/23 213 lb (96.6 kg)  02/19/23 211 lb (95.7 kg)  02/06/23 216 lb 1.9 oz (98 kg)       Assessment & Plan:  Acute bronchitis with COPD (HCC) -     Azithromycin; Take as directed  Dispense: 6 each; Refill: 0 -     Promethazine-DM; Take 5 mLs by mouth 4 (four) times daily as needed.  Dispense: 118 mL; Refill: 0 -     predniSONE; Take 3 tablets (30 mg total) by mouth daily for 3 days, THEN 2 tablets (20 mg total) daily for 3 days, THEN 1 tablet (10 mg total) daily for 3 days, THEN 0.5 tablets (5 mg total) daily for 3 days.  Dispense: 20 tablet; Refill: 0   Assessment and Plan    Bronchitis Symptoms started on Saturday with exposure to husband who was diagnosed with bronchitis. No wheezing but reports chest tightness. No fever. Mild sinus congestion managed with Zyrtec. COVID-19 ruled out. -Prescribe Azithromycin (Z-Pak). -Prescribe Prednisone taper. -Prescribe Phenergan DM for cough.  Sinus congestion Mild symptoms managed with Zyrtec. -Continue Zyrtec as  needed.        I discussed the assessment and treatment plan with the patient. The patient was provided an opportunity to ask questions and all were answered. The patient agreed with the plan and demonstrated an understanding of the instructions.   The patient was advised to call back or seek an in-person evaluation if the symptoms worsen or if the condition fails to improve as anticipated.  Michelle Schultz, DO [facility] at [facility] [phone] (phone) [phone] (fax)  [facility]

## 2023-03-26 ENCOUNTER — Other Ambulatory Visit: Payer: Self-pay | Admitting: Family Medicine

## 2023-03-26 ENCOUNTER — Encounter (INDEPENDENT_AMBULATORY_CARE_PROVIDER_SITE_OTHER): Payer: Self-pay | Admitting: Family Medicine

## 2023-03-26 ENCOUNTER — Other Ambulatory Visit (HOSPITAL_COMMUNITY): Payer: Self-pay

## 2023-03-26 ENCOUNTER — Ambulatory Visit (INDEPENDENT_AMBULATORY_CARE_PROVIDER_SITE_OTHER): Payer: PPO | Admitting: Family Medicine

## 2023-03-26 VITALS — BP 126/76 | HR 57 | Temp 98.0°F | Ht 69.0 in | Wt 214.0 lb

## 2023-03-26 DIAGNOSIS — E559 Vitamin D deficiency, unspecified: Secondary | ICD-10-CM

## 2023-03-26 DIAGNOSIS — E669 Obesity, unspecified: Secondary | ICD-10-CM

## 2023-03-26 DIAGNOSIS — F509 Eating disorder, unspecified: Secondary | ICD-10-CM | POA: Insufficient documentation

## 2023-03-26 DIAGNOSIS — Z6831 Body mass index (BMI) 31.0-31.9, adult: Secondary | ICD-10-CM

## 2023-03-26 DIAGNOSIS — F5089 Other specified eating disorder: Secondary | ICD-10-CM | POA: Diagnosis not present

## 2023-03-26 DIAGNOSIS — E039 Hypothyroidism, unspecified: Secondary | ICD-10-CM

## 2023-03-26 MED ORDER — BUPROPION HCL ER (XL) 300 MG PO TB24
300.0000 mg | ORAL_TABLET | Freq: Every day | ORAL | 0 refills | Status: DC
  Filled 2023-03-26: qty 30, 30d supply, fill #0

## 2023-03-26 NOTE — Assessment & Plan Note (Signed)
Patient on Wellbutrin and not noticing much change in her energy levels or cravings.  She is agreeable to increase her dose to 300mg  daily.  She has not had any side effects at current dose.  New dose sent into pharmacy.  Will follow up on medication effects at next appointment.

## 2023-03-26 NOTE — Assessment & Plan Note (Signed)
Discussed importance of vitamin d supplementation.  Vitamin d supplementation has been shown to decrease fatigue, decrease risk of progression to insulin resistance and then prediabetes, decreases risk of falling in older age and can even assist in decreasing depressive symptoms in PTSD.   Patient encouraged to continue her current OTC.  Will need repeat lab in 2 months.

## 2023-03-26 NOTE — Progress Notes (Signed)
   SUBJECTIVE:  Chief Complaint: Obesity  Interim History: Patient has had some house guests for the last few weeks.  She doesn't  feel like she has been doing as well as she has been somewhat more indulgent with her intake. Her kids are coming to her house for Thanksgiving and she will be cooking. Patient reports the beginning of the day is easier to follow and it begins to unravel later afternoon.   Michelle Pratt is here to discuss her progress with her obesity treatment plan. She is on the Category 3 Plan and states she is following her eating plan approximately 40 % of the time. She states she is exercising 60 minutes 2 times per week.   OBJECTIVE: Visit Diagnoses: Problem List Items Addressed This Visit       Other   Vitamin D deficiency    Discussed importance of vitamin d supplementation.  Vitamin d supplementation has been shown to decrease fatigue, decrease risk of progression to insulin resistance and then prediabetes, decreases risk of falling in older age and can even assist in decreasing depressive symptoms in PTSD.   Patient encouraged to continue her current OTC.  Will need repeat lab in 2 months.       Disorder of eating - Primary    Patient on Wellbutrin and not noticing much change in her energy levels or cravings.  She is agreeable to increase her dose to 300mg  daily.  She has not had any side effects at current dose.  New dose sent into pharmacy.  Will follow up on medication effects at next appointment.       Relevant Medications   buPROPion (WELLBUTRIN XL) 300 MG 24 hr tablet   Other Visit Diagnoses     BMI 31.0-31.9,adult       Obesity with starting BMI of 31.6           Vitals Temp: 98 F (36.7 C) BP: 126/76 Pulse Rate: (!) 57 SpO2: 96 %   Anthropometric Measurements Height: 5\' 9"  (1.753 m) Weight: 214 lb (97.1 kg) BMI (Calculated): 31.59 Weight at Last Visit: 213 lb Weight Lost Since Last Visit: 0 Weight Gained Since Last Visit: 1 Starting Weight:  214 lb Total Weight Loss (lbs): 0 lb (0 kg)   Body Composition  Body Fat %: 43 % Fat Mass (lbs): 92.2 lbs Muscle Mass (lbs): 115.8 lbs Total Body Water (lbs): 76.6 lbs Visceral Fat Rating : 12   Other Clinical Data Today's Visit #: 4 Starting Date: 02/05/23     ASSESSMENT AND PLAN:  Diet: Michelle Pratt is currently in the action stage of change. As such, her goal is to continue with weight loss efforts. She has agreed to keeping a food journal and adhering to recommended goals of 1400-1500 calories and 95 or more grams protein.  Exercise: Michelle Pratt has been instructed that some exercise is better than none for weight loss and overall health benefits.   Behavior Modification:  We discussed the following Behavioral Modification Strategies today: increasing lean protein intake, increasing vegetables, meal planning and cooking strategies, holiday eating strategies, and keep a strict food journal.  No follow-ups on file.[name] She was informed of the importance of frequent follow up visits to maximize her success with intensive lifestyle modifications for her multiple health conditions.  Attestation Statements:   Reviewed by clinician on day of visit: allergies, medications, problem list, medical history, surgical history, family history, social history, and previous encounter notes.    Michelle Hitt, MD

## 2023-03-27 ENCOUNTER — Other Ambulatory Visit (HOSPITAL_COMMUNITY): Payer: Self-pay

## 2023-03-27 MED ORDER — LEVOTHYROXINE SODIUM 125 MCG PO TABS
125.0000 ug | ORAL_TABLET | Freq: Every day | ORAL | 1 refills | Status: DC
  Filled 2023-03-27: qty 90, 90d supply, fill #0
  Filled 2023-06-30: qty 90, 90d supply, fill #1

## 2023-03-28 ENCOUNTER — Other Ambulatory Visit (HOSPITAL_COMMUNITY): Payer: Self-pay

## 2023-04-01 ENCOUNTER — Telehealth: Payer: Self-pay | Admitting: Cardiology

## 2023-04-01 NOTE — Telephone Encounter (Signed)
Patient wants results of her recent Stress Echocardiogram, Echocardiogram and EKG tests faxed to her underwriters - [employer], Attn: Milford Cage, fax# [phone].

## 2023-04-01 NOTE — Telephone Encounter (Signed)
Records sent as requested 

## 2023-04-24 ENCOUNTER — Other Ambulatory Visit (HOSPITAL_COMMUNITY): Payer: Self-pay

## 2023-04-24 ENCOUNTER — Ambulatory Visit (INDEPENDENT_AMBULATORY_CARE_PROVIDER_SITE_OTHER): Payer: PPO | Admitting: Adult Health

## 2023-04-24 ENCOUNTER — Encounter (INDEPENDENT_AMBULATORY_CARE_PROVIDER_SITE_OTHER): Payer: Self-pay | Admitting: Adult Health

## 2023-04-24 VITALS — BP 118/71 | HR 59 | Temp 98.8°F | Ht 69.0 in | Wt 210.0 lb

## 2023-04-24 DIAGNOSIS — E669 Obesity, unspecified: Secondary | ICD-10-CM

## 2023-04-24 DIAGNOSIS — R7303 Prediabetes: Secondary | ICD-10-CM

## 2023-04-24 DIAGNOSIS — E7849 Other hyperlipidemia: Secondary | ICD-10-CM | POA: Diagnosis not present

## 2023-04-24 DIAGNOSIS — F5089 Other specified eating disorder: Secondary | ICD-10-CM | POA: Diagnosis not present

## 2023-04-24 DIAGNOSIS — Z6831 Body mass index (BMI) 31.0-31.9, adult: Secondary | ICD-10-CM

## 2023-04-24 MED ORDER — BUPROPION HCL ER (XL) 300 MG PO TB24
300.0000 mg | ORAL_TABLET | Freq: Every day | ORAL | 0 refills | Status: DC
  Filled 2023-04-24 – 2023-05-08 (×2): qty 30, 30d supply, fill #0

## 2023-04-24 NOTE — Progress Notes (Signed)
WEIGHT SUMMARY AND BIOMETRICS  Vitals Temp: 98.8 F (37.1 C) BP: 118/71 Pulse Rate: (!) 59 SpO2: 96 %   Anthropometric Measurements Height: 5\' 9"  (1.753 m) Weight: 210 lb (95.3 kg) BMI (Calculated): 31 Weight at Last Visit: 214 lb Weight Lost Since Last Visit: 4 lb Weight Gained Since Last Visit: 0 Starting Weight: 214 lb Total Weight Loss (lbs): 4 lb (1.814 kg)   Body Composition  Body Fat %: 42.2 % Fat Mass (lbs): 88.8 lbs Muscle Mass (lbs): 115.4 lbs Total Body Water (lbs): 76.6 lbs Visceral Fat Rating : 12   Other Clinical Data Fasting: no Labs: no Today's Visit #: 5 Starting Date: 02/05/23    Chief Complaint:   OBESITY Michelle Pratt is here to discuss her progress with her obesity treatment plan. She is on the keeping a food journal and adhering to recommended goals of 1400-1500 calories and 95+ protein and states she is following her eating plan approximately 98 % of the time. She states she is exercising Walking and Water Aerobics 30-60 minutes 3-4 times per week.   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  Reviewed Bioimpedance results with pt: Muscle Mass: -0.4 lbs Adipose Mass: -3.4 lbs  She has a 32 oz water bottle, she estimates to drink 1 to 1.5 bottle/day  Now that she is retired, she has more opportunity to hydrate throughout the day  2025 Health Goals: 1) "Shape up: 2) Continue to loss weight 3) Increase time for hobbies, ie [employer] Work  Subjective:   1. Other hyperlipidemia Lipid Panel     Component Value Date/Time   CHOL 205 (H) 02/05/2023 0948   TRIG 125 02/05/2023 0948   HDL 66 02/05/2023 0948   CHOLHDL 3 08/23/2022 1403   VLDL 32.2 08/23/2022 1403   LDLCALC 117 (H) 02/05/2023 0948   LDLDIRECT 133.3 03/11/2012 1112   LABVLDL 22 02/05/2023 0948   The 10-year ASCVD risk score (Arnett DK, et al., 2019) is: 5%   Values used to calculate the score:     Age: 67 years     Sex: Female     Is  Non-Hispanic African American: No     Diabetic: No     Tobacco smoker: No     Systolic Blood Pressure: 118 mmHg     Is BP treated: No     HDL Cholesterol: 66 mg/dL     Total Cholesterol: 205 mg/dL   She is not on statin therapy, denies CP with exertion.  Recent ECHO STRESS TEST results 03/03/2023- negative for ischemic changes  2. Prediabetes Lab Results  Component Value Date   HGBA1C 5.7 (H) 02/05/2023   HGBA1C 5.6 07/29/2018   HGBA1C 5.9 12/17/2017    She endorse reduced cravings and subsequent snacking with recent increase from Buproprion XL from 150 mg to 300mg   3. Other disorder of eating 03/26/2023 Bupropion XL 150mg  increased to 300mg  daily. She endorse reduced cravings and subsequent snacking She endorses stable mood, denies SI/HI She denies hx of seizure d/o  Assessment/Plan:   1. Other hyperlipidemia Reduce saturated fat and increase cardiovascular exercise  2. Prediabetes (Primary) Increase protein, increase regular exericse  3. Other disorder of eating Refill - buPROPion (WELLBUTRIN XL) 300 MG 24 hr tablet; Take 1 tablet (300 mg total) by mouth daily.  Dispense: 30 tablet; Refill: 0  4. BMI 31.0-31.9,adult, Current BMI 31  Michelle Pratt is currently in the action stage of change. As such, her goal is to  continue with weight loss efforts. She has agreed to keeping a food journal and adhering to recommended goals of 1400-1500 calories and 95+ protein.   Exercise goals: Older adults should follow the adult guidelines. When older adults cannot meet the adult guidelines, they should be as physically active as their abilities and conditions will allow.  Older adults should do exercises that maintain or improve balance if they are at risk of falling.  Older adults should determine their level of effort for physical activity relative to their level of fitness.  Older adults with chronic conditions should understand whether and how their conditions affect their ability to do  regular physical activity safely.  Behavioral modification strategies: increasing lean protein intake, decreasing simple carbohydrates, increasing vegetables, increasing water intake, meal planning and cooking strategies, keeping healthy foods in the home, ways to avoid boredom eating, and planning for success.  Michelle Pratt has agreed to follow-up with our clinic in 3 weeks. She was informed of the importance of frequent follow-up visits to maximize her success with intensive lifestyle modifications for her multiple health conditions.   Objective:   Blood pressure 118/71, pulse (!) 59, temperature 98.8 F (37.1 C), height 5\' 9"  (1.753 m), weight 210 lb (95.3 kg), SpO2 96%. Body mass index is 31.01 kg/m.  General: Cooperative, alert, well developed, in no acute distress. HEENT: Conjunctivae and lids unremarkable. Cardiovascular: Regular rhythm.  Lungs: Normal work of breathing. Neurologic: No focal deficits.   Lab Results  Component Value Date   CREATININE 0.79 02/05/2023   BUN 19 02/05/2023   NA 142 02/05/2023   K 4.5 02/05/2023   CL 102 02/05/2023   CO2 23 02/05/2023   Lab Results  Component Value Date   ALT 49 (H) 02/05/2023   AST 25 02/05/2023   ALKPHOS 76 02/05/2023   BILITOT 0.5 02/05/2023   Lab Results  Component Value Date   HGBA1C 5.7 (H) 02/05/2023   HGBA1C 5.6 07/29/2018   HGBA1C 5.9 12/17/2017   Lab Results  Component Value Date   INSULIN 15.8 02/05/2023   INSULIN 7.7 07/29/2018   INSULIN 10.1 02/02/2018   Lab Results  Component Value Date   TSH 1.360 02/05/2023   Lab Results  Component Value Date   CHOL 205 (H) 02/05/2023   HDL 66 02/05/2023   LDLCALC 117 (H) 02/05/2023   LDLDIRECT 133.3 03/11/2012   TRIG 125 02/05/2023   CHOLHDL 3 08/23/2022   Lab Results  Component Value Date   VD25OH 32.8 02/05/2023   VD25OH 25.05 (L) 02/12/2019   VD25OH 22.9 (L) 07/29/2018   Lab Results  Component Value Date   WBC 4.6 02/05/2023   HGB 14.6 02/05/2023    HCT 45.0 02/05/2023   MCV 95 02/05/2023   PLT 248 02/05/2023   No results found for: "IRON", "TIBC", "FERRITIN"   Attestation Statements:   Reviewed by clinician on day of visit: allergies, medications, problem list, medical history, surgical history, family history, social history, and previous encounter notes.  I have reviewed the above documentation for accuracy and completeness, and I agree with the above. -  Ashtyn Freilich d. Miyah Hampshire, NP-C

## 2023-05-06 ENCOUNTER — Other Ambulatory Visit (HOSPITAL_COMMUNITY): Payer: Self-pay

## 2023-05-08 ENCOUNTER — Other Ambulatory Visit (HOSPITAL_COMMUNITY): Payer: Self-pay

## 2023-06-03 ENCOUNTER — Ambulatory Visit (INDEPENDENT_AMBULATORY_CARE_PROVIDER_SITE_OTHER): Payer: PPO | Admitting: Family Medicine

## 2023-06-04 ENCOUNTER — Other Ambulatory Visit (INDEPENDENT_AMBULATORY_CARE_PROVIDER_SITE_OTHER): Payer: Self-pay | Admitting: Adult Health

## 2023-06-04 DIAGNOSIS — F5089 Other specified eating disorder: Secondary | ICD-10-CM

## 2023-06-10 ENCOUNTER — Other Ambulatory Visit (HOSPITAL_COMMUNITY): Payer: Self-pay

## 2023-06-10 ENCOUNTER — Ambulatory Visit (INDEPENDENT_AMBULATORY_CARE_PROVIDER_SITE_OTHER): Payer: PPO | Admitting: Family Medicine

## 2023-06-10 ENCOUNTER — Encounter (INDEPENDENT_AMBULATORY_CARE_PROVIDER_SITE_OTHER): Payer: Self-pay | Admitting: Family Medicine

## 2023-06-10 VITALS — BP 111/67 | HR 58 | Temp 97.4°F | Ht 69.0 in | Wt 212.0 lb

## 2023-06-10 DIAGNOSIS — F5089 Other specified eating disorder: Secondary | ICD-10-CM | POA: Diagnosis not present

## 2023-06-10 DIAGNOSIS — R7303 Prediabetes: Secondary | ICD-10-CM | POA: Diagnosis not present

## 2023-06-10 DIAGNOSIS — E669 Obesity, unspecified: Secondary | ICD-10-CM | POA: Diagnosis not present

## 2023-06-10 DIAGNOSIS — Z6831 Body mass index (BMI) 31.0-31.9, adult: Secondary | ICD-10-CM | POA: Diagnosis not present

## 2023-06-10 MED ORDER — BUPROPION HCL ER (XL) 300 MG PO TB24
300.0000 mg | ORAL_TABLET | Freq: Every day | ORAL | 0 refills | Status: DC
  Filled 2023-06-10 (×2): qty 90, 90d supply, fill #0

## 2023-06-10 NOTE — Progress Notes (Signed)
   SUBJECTIVE:  Chief Complaint: Obesity  Interim History: Patient had a good holiday season.  She mentions it took her a while to get into the calories and protein budget.  She feels somewhat frustrated with the speed of her weight loss.  There was quite a bit of indulgent food and activities and events over the holiday that led to her having different schedule and routine.  She isn't anticipating much sabotage in terms of disruption of her routine over the next few weeks.  She has found that sometimes she was under for calories and protein and sometimes was at goal for protein but above calories.   Michelle Pratt is here to discuss her progress with her obesity treatment plan. She is on the keeping a food journal and adhering to recommended goals of 1400-1500 calories and 95+ grams of protein and states she is following her eating plan approximately 90 % of the time. She states she is exercising 30-60 minutes 2-4 times per week.   OBJECTIVE: Visit Diagnoses: Problem List Items Addressed This Visit       Other   Prediabetes   Last A1c done in September 2024 of 5.7.  Patient has been consistently journaling with focus on protein intake.  She is able to get in her protein goal and stay within her calories 90% of the time.  She will need repeat labs with either PCP or here at [facility] in the next 1 to 2 months      Disorder of eating   Patient doing well on Wellbutrin 300 mg daily.  She has no side effects of this medication.  She is noticing a significant improvement in her food choices and intake.      Relevant Medications   buPROPion (WELLBUTRIN XL) 300 MG 24 hr tablet   Other Visit Diagnoses       Obesity with starting BMI of 31.6    -  Primary     BMI 31.0-31.9,adult, Current BMI 31           No data recorded  No data recorded  No data recorded  No data recorded    ASSESSMENT AND PLAN:  Diet: Michelle Pratt is currently in the action stage of change. As such,  her goal is to continue with weight loss efforts. She has agreed to keeping a food journal and adhering to recommended goals of 1400-1500 calories and 95 or more grams of protein daily.  Exercise: Michelle Pratt has been instructed that some exercise is better than none for weight loss and overall health benefits.   Behavior Modification:  We discussed the following Behavioral Modification Strategies today: increasing lean protein intake, increasing vegetables, meal planning and cooking strategies, better snacking choices, and avoiding temptations. We discussed various medication options to help [facility] with her weight loss efforts and we both agreed to continue bupropion OTC.  Return in about 3 weeks (around 07/01/2023) for fasting labs.. She was informed of the importance of frequent follow up visits to maximize her success with intensive lifestyle modifications for her multiple health conditions.  Attestation Statements:   Reviewed by clinician on day of visit: allergies, medications, problem list, medical history, surgical history, family history, social history, and previous encounter notes.      Reuben Likes, MD

## 2023-06-15 NOTE — Assessment & Plan Note (Signed)
Last A1c done in September 2024 of 5.7.  Patient has been consistently journaling with focus on protein intake.  She is able to get in her protein goal and stay within her calories 90% of the time.  She will need repeat labs with either PCP or here at [facility] in the next 1 to 2 months

## 2023-06-15 NOTE — Assessment & Plan Note (Signed)
Patient doing well on Wellbutrin 300 mg daily.  She has no side effects of this medication.  She is noticing a significant improvement in her food choices and intake.

## 2023-07-17 ENCOUNTER — Ambulatory Visit (INDEPENDENT_AMBULATORY_CARE_PROVIDER_SITE_OTHER): Payer: PPO | Admitting: Family Medicine

## 2023-07-17 ENCOUNTER — Encounter (INDEPENDENT_AMBULATORY_CARE_PROVIDER_SITE_OTHER): Payer: Self-pay | Admitting: Family Medicine

## 2023-07-17 VITALS — BP 120/60 | HR 58 | Temp 97.8°F | Ht 69.0 in | Wt 209.0 lb

## 2023-07-17 DIAGNOSIS — E559 Vitamin D deficiency, unspecified: Secondary | ICD-10-CM | POA: Diagnosis not present

## 2023-07-17 DIAGNOSIS — E7849 Other hyperlipidemia: Secondary | ICD-10-CM | POA: Diagnosis not present

## 2023-07-17 DIAGNOSIS — R7303 Prediabetes: Secondary | ICD-10-CM

## 2023-07-17 DIAGNOSIS — E66811 Obesity, class 1: Secondary | ICD-10-CM

## 2023-07-17 DIAGNOSIS — Z6831 Body mass index (BMI) 31.0-31.9, adult: Secondary | ICD-10-CM

## 2023-07-17 NOTE — Progress Notes (Signed)
 SUBJECTIVE:  Chief Complaint: Obesity  Interim History: Patient frustrated with speed of weight loss.  She is wondering about medication options for weight loss.  She is sometimes not getting all protein in or all calories.  Somedays she is able to get all the food in.  She has a shower this weekend and concert.  Wedding is April 12th.  She brought food log today.   Michelle Pratt is here to discuss her progress with her obesity treatment plan. She is on the Category 3 Plan and states she is following her eating plan approximately 90 % of the time. She states she is walking, gardening, and water aerobics 60 minutes 5-6 times per week.   OBJECTIVE: Visit Diagnoses: Problem List Items Addressed This Visit       Other   Prediabetes - Primary   Patient is fasting for labs today.  Will plan to discuss lab results at next appointment.  She has done well with mindful eating and limiting her simple carbohydrate intake.      Relevant Orders   Comprehensive metabolic panel (Completed)   Hemoglobin A1c (Completed)   Insulin, random (Completed)   Vitamin D deficiency   Last vitamin D level was below goal in September 2024.  Needs a repeat vitamin D level today.      Relevant Orders   VITAMIN D 25 Hydroxy (Vit-D Deficiency, Fractures) (Completed)   Morbid obesity (HCC)   Anthropometric Measurements Height: 5\' 9"  (1.753 m) Weight: 209 lb (94.8 kg) BMI (Calculated): 30.85 Weight at Last Visit: 212 lb Weight Lost Since Last Visit: 3 Weight Gained Since Last Visit: 0 Starting Weight: 214 lb Total Weight Loss (lbs): 5 lb (2.268 kg) Body Composition  Body Fat %: 41.8 % Fat Mass (lbs): 87.4 lbs Muscle Mass (lbs): 115.4 lbs Total Body Water (lbs): 74.8 lbs Visceral Fat Rating : 11 Other Clinical Data Fasting: yes Labs: yes Today's Visit #: 7 Starting Date: 02/05/24 Comments: Cat 3  Patient has been trying to follow category 3 plan and has the option of journaling if that is easier to stay  compliant to.  She is working towards consistency of food intake.      Other hyperlipidemia   Last lab from September 2024 with an LDL slightly elevated in the 1 teens.  HDL higher at that time over 60.  Needs a repeat fasting lipid panel today.      Relevant Orders   Lipid Panel With LDL/HDL Ratio (Completed)   Other Visit Diagnoses       Obesity with starting BMI of 31.6         BMI 31.0-31.9,adult, Current BMI 31           No data recorded       07/17/2023    8:00 AM 06/10/2023    7:00 AM 04/24/2023   11:00 AM  Vitals with BMI  Height 5\' 9"  5\' 9"  5\' 9"   Weight 209 lbs 212 lbs 210 lbs  BMI 30.85 31.29 31  Systolic 120 111 657  Diastolic 60 67 71  Pulse 58 58 59      ASSESSMENT AND PLAN:  Diet: [name] is currently in the action stage of change. As such, her goal is to continue with weight loss efforts and has agreed to the Category 3 Plan and keeping a food journal and adhering to recommended goals of 1450-1600 calories and 95 or more grams protein daily.   Exercise:  Older adults with chronic conditions should understand  whether and how their conditions affect their ability to do regular physical activity safely.  Behavior Modification:  We discussed the following Behavioral Modification Strategies today: increasing lean protein intake, increasing vegetables, meal planning and cooking strategies, avoiding temptations, planning for success, and keep a strict food journal.   No follow-ups on file.[name] She was informed of the importance of frequent follow up visits to maximize her success with intensive lifestyle modifications for her multiple health conditions.  Attestation Statements:   Reviewed by clinician on day of visit: allergies, medications, problem list, medical history, surgical history, family history, social history, and previous encounter notes.    Reuben Likes, MD

## 2023-07-18 LAB — COMPREHENSIVE METABOLIC PANEL
ALT: 34 IU/L — ABNORMAL HIGH (ref 0–32)
AST: 20 IU/L (ref 0–40)
Albumin: 4.6 g/dL (ref 3.9–4.9)
Alkaline Phosphatase: 73 IU/L (ref 44–121)
BUN/Creatinine Ratio: 23 (ref 12–28)
BUN: 22 mg/dL (ref 8–27)
Bilirubin Total: 0.4 mg/dL (ref 0.0–1.2)
CO2: 24 mmol/L (ref 20–29)
Calcium: 9.9 mg/dL (ref 8.7–10.3)
Chloride: 102 mmol/L (ref 96–106)
Creatinine, Ser: 0.94 mg/dL (ref 0.57–1.00)
Globulin, Total: 1.9 g/dL (ref 1.5–4.5)
Glucose: 108 mg/dL — ABNORMAL HIGH (ref 70–99)
Potassium: 4.7 mmol/L (ref 3.5–5.2)
Sodium: 143 mmol/L (ref 134–144)
Total Protein: 6.5 g/dL (ref 6.0–8.5)
eGFR: 67 mL/min/{1.73_m2} (ref >59)

## 2023-07-18 LAB — HEMOGLOBIN A1C
Est. average glucose Bld gHb Est-mCnc: 117 mg/dL
Hgb A1c MFr Bld: 5.7 % — ABNORMAL HIGH (ref 4.8–5.6)

## 2023-07-18 LAB — LIPID PANEL WITH LDL/HDL RATIO
Cholesterol, Total: 243 mg/dL — ABNORMAL HIGH (ref 100–199)
HDL: 67 mg/dL (ref >39)
LDL Chol Calc (NIH): 153 mg/dL — ABNORMAL HIGH (ref 0–99)
LDL/HDL Ratio: 2.3 ratio (ref 0.0–3.2)
Triglycerides: 133 mg/dL (ref 0–149)
VLDL Cholesterol Cal: 23 mg/dL (ref 5–40)

## 2023-07-18 LAB — VITAMIN D 25 HYDROXY (VIT D DEFICIENCY, FRACTURES): Vit D, 25-Hydroxy: 53.5 ng/mL (ref 30.0–100.0)

## 2023-07-18 LAB — INSULIN, RANDOM: INSULIN: 13.8 u[IU]/mL (ref 2.6–24.9)

## 2023-07-29 DIAGNOSIS — E7849 Other hyperlipidemia: Secondary | ICD-10-CM | POA: Insufficient documentation

## 2023-07-29 NOTE — Assessment & Plan Note (Signed)
 Last lab from September 2024 with an LDL slightly elevated in the 1 teens.  HDL higher at that time over 60.  Needs a repeat fasting lipid panel today.

## 2023-07-29 NOTE — Assessment & Plan Note (Signed)
 Anthropometric Measurements Height: 5\' 9"  (1.753 m) Weight: 209 lb (94.8 kg) BMI (Calculated): 30.85 Weight at Last Visit: 212 lb Weight Lost Since Last Visit: 3 Weight Gained Since Last Visit: 0 Starting Weight: 214 lb Total Weight Loss (lbs): 5 lb (2.268 kg) Body Composition  Body Fat %: 41.8 % Fat Mass (lbs): 87.4 lbs Muscle Mass (lbs): 115.4 lbs Total Body Water (lbs): 74.8 lbs Visceral Fat Rating : 11 Other Clinical Data Fasting: yes Labs: yes Today's Visit #: 7 Starting Date: 02/05/24 Comments: Cat 3  Patient has been trying to follow category 3 plan and has the option of journaling if that is easier to stay compliant to.  She is working towards consistency of food intake.

## 2023-07-29 NOTE — Assessment & Plan Note (Signed)
 Last vitamin D level was below goal in September 2024.  Needs a repeat vitamin D level today.

## 2023-07-29 NOTE — Assessment & Plan Note (Signed)
 Patient is fasting for labs today.  Will plan to discuss lab results at next appointment.  She has done well with mindful eating and limiting her simple carbohydrate intake.

## 2023-08-15 ENCOUNTER — Other Ambulatory Visit (HOSPITAL_COMMUNITY): Payer: Self-pay

## 2023-08-15 ENCOUNTER — Telehealth (INDEPENDENT_AMBULATORY_CARE_PROVIDER_SITE_OTHER): Admitting: Family Medicine

## 2023-08-15 DIAGNOSIS — H10023 Other mucopurulent conjunctivitis, bilateral: Secondary | ICD-10-CM

## 2023-08-15 MED ORDER — MOXIFLOXACIN HCL 0.5 % OP SOLN
1.0000 [drp] | Freq: Three times a day (TID) | OPHTHALMIC | 0 refills | Status: DC
Start: 2023-08-15 — End: 2023-09-18
  Filled 2023-08-15: qty 3, 20d supply, fill #0

## 2023-08-15 NOTE — Progress Notes (Signed)
 MyChart Video Visit    Virtual Visit via Video Note   This patient is at least at moderate risk for complications without adequate follow up. This format is felt to be most appropriate for this patient at this time. Physical exam was limited by quality of the video and audio technology used for the visit. heather was able to get the patient set up on a video visit.  Patient location: outside Patient and provider in visit Provider location: Office  I discussed the limitations of evaluation and management by telemedicine and the availability of in person appointments. The patient expressed understanding and agreed to proceed.  Visit Date: 08/15/2023  Today's healthcare provider: Donato Schultz, DO     Subjective:    Patient ID: Michelle Pratt, female    DOB: [birth year], 68 y.o.   MRN: [id number]  Chief Complaint  Patient presents with   Eye Problem    HPI Patient is in today for c/o pink eye and pink eye exposure.  Discussed the use of AI scribe software for clinical note transcription with the patient, who gave verbal consent to proceed.  History of Present Illness Michelle Pratt is a 68 year old female who presents with concerns about potential conjunctivitis. She is accompanied by her son, [name], who currently has conjunctivitis.  She experiences a gritty sensation in her eyes, which she finds difficult to distinguish from symptoms caused by pollen or conjunctivitis. Her eyes appear slightly pink in the corners. No eye discharge is present at this time.  She is particularly worried about the potential worsening of symptoms due to an upcoming family wedding on April 12th.    Past Medical History:  Diagnosis Date   Acute bacterial sinusitis 05/30/2014   AK (actinic keratosis) 02/05/2023   Allergic rhinitis 12/31/2013   Bunion, right 06/08/2017   Constipation    Cough 12/31/2013   COVID-19 07/10/2021   Cystitis 10/12/2015   Depression    Depression, major,  single episode, moderate (HCC) 02/12/2019   Family history of skin cancer 02/05/2023   Fatty liver    Foot pain, bilateral 10/30/2012   Glaucoma    Hallux limitus of left foot 06/08/2017   Heart murmur    Heartburn    History of anaphylaxis 12/31/2013   HOT FLASHES 03/02/2010   Qualifier: Diagnosis of   By: Janit Bern         Hypothyroidism 08/10/2021   Joint pain    Melanocytic nevi of trunk 02/05/2023   Menopause    Morbid obesity (HCC) 03/09/2018   Starting BMI greater then 30     Nevus of scalp 02/05/2023   NONTOXIC MULTINODULAR GOITER 10/20/2009   Qualifier: Diagnosis of   By: Janit Bern         Other specified hypothyroidism 10/20/2009   Qualifier: Diagnosis of   By: Janit Bern         Pain in left knee 03/12/2018   Pain in right knee 03/12/2018   Prediabetes    Preventative health care 08/10/2021   Rosacea 02/05/2023   RUQ pain 06/07/2022   Seborrheic keratosis 02/05/2023   Vaginal laceration 09/13/2014   Vitamin D deficiency     Past Surgical History:  Procedure Laterality Date   COLONOSCOPY  05/30/2010   Dr.Jacobs   foot Left 2019   dr Ardelle Anton-- [facility] ----  first metarsal replacement L foot    oraf     left eyebrow   ORIF  ELBOW FRACTURE     TONSILLECTOMY     TUBAL LIGATION      Family History  Problem Relation Age of Onset   Thyroid disease Mother    Stroke Mother    Alcohol abuse Mother    Parkinsonism Mother    Hypertension Father    Cancer Father 43       prostate   Hyperlipidemia Father    Stroke Brother    Colon cancer Neg Hx     Social History   Socioeconomic History   Marital status: Married    Spouse name: [name]   Number of children: 3   Years of education: Not on file   Highest education level: Not on file  Occupational History   Occupation: Teacher, 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[employer]    Comment: pre surgical  Tobacco Use   Smoking status: Former    Current packs/day: 0.00    Average packs/day: 0.8  packs/day for 18.0 years (13.5 ttl pk-yrs)    Types: Cigarettes    Start date: 08/16/1974    Quit date: 08/15/1992    Years since quitting: 31.0   Smokeless tobacco: Never   Tobacco comments:    over 20 years ago  Vaping Use   Vaping status: Never Used  Substance and Sexual Activity   Alcohol use: Yes    Alcohol/week: 5.0 - 7.0 standard drinks of alcohol    Types: 5 - 7 Standard drinks or equivalent per week   Drug use: No   Sexual activity: Yes    Partners: Male  Other Topics Concern   Not on file  Social History Narrative   Exercise--some bike riding   Caffeine Use: daily 1 cup of coffee   Social Drivers of Corporate investment banker Strain: Not on file  Food Insecurity: Not on file  Transportation Needs: Not on file  Physical Activity: Not on file  Stress: Not on file  Social Connections: Not on file  Intimate Partner Violence: Not on file    Outpatient Medications Prior to Visit  Medication Sig Dispense Refill   acetaminophen (TYLENOL) 325 MG tablet Take 650 mg by mouth every 6 (six) hours as needed for mild pain or moderate pain.     buPROPion (WELLBUTRIN XL) 300 MG 24 hr tablet Take 1 tablet (300 mg total) by mouth daily. 90 tablet 0   cetirizine (ZYRTEC) 10 MG tablet Take 10 mg by mouth as needed for allergies or rhinitis.     Cholecalciferol (VITAMIN D3) 125 MCG (5000 UT) CAPS Take 1 capsule (5,000 Units total) by mouth daily.     EPINEPHrine 0.3 mg/0.3 mL IJ SOAJ injection Inject 0.3 mg into the muscle once as needed (allergic reaction). 2 each 0   FLUoxetine (PROZAC) 20 MG capsule Take 1 capsule (20 mg total) by mouth daily. 90 capsule 3   levothyroxine (SYNTHROID) 125 MCG tablet Take 1 tablet (125 mcg total) by mouth daily before breakfast. 90 tablet 1   Melatonin 3 MG CAPS Take 3 mg by mouth as needed (sleep).     Multiple Vitamin (MULTI VITAMIN PO) Take 1 tablet by mouth daily.     Facility-Administered Medications Prior to Visit  Medication Dose Route  Frequency Provider Last Rate Last Admin   albuterol (PROVENTIL) (2.5 MG/3ML) 0.083% nebulizer solution 2.5 mg  2.5 mg Nebulization Once Olive Bass, FNP        Allergies  Allergen Reactions   Bee Venom Anaphylaxis and Swelling  Morphine Nausea And Vomiting    Review of Systems  Constitutional:  Negative for fever and malaise/fatigue.  HENT:  Negative for congestion.   Eyes:  Positive for redness. Negative for blurred vision and discharge.  Respiratory:  Negative for shortness of breath.   Cardiovascular:  Negative for chest pain, palpitations and leg swelling.  Gastrointestinal:  Negative for abdominal pain, blood in stool and nausea.  Genitourinary:  Negative for dysuria and frequency.  Musculoskeletal:  Negative for falls.  Skin:  Negative for rash.  Neurological:  Negative for dizziness, loss of consciousness and headaches.  Endo/Heme/Allergies:  Negative for environmental allergies.  Psychiatric/Behavioral:  Negative for depression. The patient is not nervous/anxious.        Objective:    Physical Exam Constitutional:      General: She is not in acute distress.    Appearance: Normal appearance.  Eyes:     Conjunctiva/sclera:     Right eye: Right conjunctiva is injected.     Left eye: Left conjunctiva is injected.  Neurological:     Mental Status: She is alert.    There were no vitals taken for this visit. Wt Readings from Last 3 Encounters:  07/17/23 209 lb (94.8 kg)  06/10/23 212 lb (96.2 kg)  04/24/23 210 lb (95.3 kg)       Assessment & Plan:  Pink eye disease of both eyes -     Moxifloxacin HCl; Place 1 drop into both eyes 3 (three) times daily.  Dispense: 3 mL; Refill: 0   Assessment and Plan Assessment & Plan Conjunctivitis (suspected)   There is concern for conjunctivitis due to exposure from her son, who has been diagnosed with pink eye. She reports a gritty sensation in her eyes and slight pinkness in the corners, but no discharge. The  etiology is uncertain, possibly pollen-related. A prescription is provided as a precautionary measure, especially with an upcoming family wedding. Send the prescription for conjunctivitis treatment to [facility]. Instruct her to wash all linens, washcloths, and pillowcases if conjunctivitis develops. Advise using compresses if conjunctivitis develops. Instruct to call the eye doctor if symptoms do not improve in two to three days.    I discussed the assessment and treatment plan with the patient. The patient was provided an opportunity to ask questions and all were answered. The patient agreed with the plan and demonstrated an understanding of the instructions.   The patient was advised to call back or seek an in-person evaluation if the symptoms worsen or if the condition fails to improve as anticipated.  Donato Schultz, DO  [facility] [phone] (phone) [phone] (fax)  [facility]

## 2023-08-16 ENCOUNTER — Encounter: Payer: Self-pay | Admitting: Family Medicine

## 2023-08-16 DIAGNOSIS — H10023 Other mucopurulent conjunctivitis, bilateral: Secondary | ICD-10-CM | POA: Insufficient documentation

## 2023-08-18 ENCOUNTER — Encounter (INDEPENDENT_AMBULATORY_CARE_PROVIDER_SITE_OTHER): Payer: Self-pay | Admitting: Family Medicine

## 2023-08-18 ENCOUNTER — Ambulatory Visit (INDEPENDENT_AMBULATORY_CARE_PROVIDER_SITE_OTHER): Admitting: Family Medicine

## 2023-08-18 VITALS — BP 116/72 | HR 59 | Temp 97.7°F | Ht 69.0 in | Wt 208.0 lb

## 2023-08-18 DIAGNOSIS — E7849 Other hyperlipidemia: Secondary | ICD-10-CM

## 2023-08-18 DIAGNOSIS — Z6830 Body mass index (BMI) 30.0-30.9, adult: Secondary | ICD-10-CM

## 2023-08-18 DIAGNOSIS — E669 Obesity, unspecified: Secondary | ICD-10-CM | POA: Diagnosis not present

## 2023-08-18 DIAGNOSIS — R7303 Prediabetes: Secondary | ICD-10-CM

## 2023-08-18 DIAGNOSIS — K760 Fatty (change of) liver, not elsewhere classified: Secondary | ICD-10-CM

## 2023-08-18 DIAGNOSIS — Z6831 Body mass index (BMI) 31.0-31.9, adult: Secondary | ICD-10-CM

## 2023-08-18 NOTE — Assessment & Plan Note (Signed)
 Reviewed labs from last appointment today.  A1c and insulin stable. She is working on Theatre manager.

## 2023-08-18 NOTE — Assessment & Plan Note (Signed)
 Improvement in LFTs from last labs to labs done at last appointment.  She needs a repeat lab in 3-4 months.

## 2023-08-18 NOTE — Progress Notes (Signed)
 SUBJECTIVE:  Chief Complaint: Obesity  Interim History: Patient celebrated with friends a few days ago for her birthday at [employer] bar.  She has been focusing on getting adequate protein in daily.  She has been adding protein powder to various foods.  Patient thinks she is close to goal of intake daily.  She brought her food journal in today.  Has upcoming wedding on Saturday.  No planned trips until July and then some activities in August and September.  Michelle Pratt is here to discuss her progress with her obesity treatment plan. She is keeping a food journal and adhering to recommended goals of 1450-1600 calories and 95 grams of protein and states she is following her eating plan approximately 99 % of the time. She states she is exercising and 60 minutes 3 times per week and gardening.   OBJECTIVE: Visit Diagnoses: Problem List Items Addressed This Visit       Digestive   Fatty liver   Improvement in LFTs from last labs to labs done at last appointment.  She needs a repeat lab in 3-4 months.        Other   Prediabetes   Reviewed labs from last appointment today.  A1c and insulin stable. She is working on Theatre manager.      Other hyperlipidemia - Primary   Lipid panel from last appointment reviewed today.  LDL significantly increased from 117 to 153.  Reviewed patient's food log today and she is getting in on average 35% fat for the day.  We discussed various ways to cut back on food intake after reviewing a  few days of food logs.  She is not on any medication for cholesterol.      Other Visit Diagnoses       Obesity with starting BMI of 31.6         BMI 30.0-30.9,adult           Vitals Temp: 97.7 F (36.5 C) BP: 116/72 Pulse Rate: (!) 59 SpO2: 96 %   Anthropometric Measurements Height: 5\' 9"  (1.753 m) Weight: 208 lb (94.3 kg) BMI (Calculated): 30.7 Weight at Last Visit: 209 lb Weight Lost Since Last Visit: 1 Weight Gained Since  Last Visit: 0 Starting Weight: 214 lb Total Weight Loss (lbs): 6 lb (2.722 kg)   Body Composition  Body Fat %: 41.7 % Fat Mass (lbs): 87 lbs Muscle Mass (lbs): 115.4 lbs Total Body Water (lbs): 77.2 lbs Visceral Fat Rating : 11   Other Clinical Data Today's Visit #: 8 Starting Date: 02/05/24 Comments: Cat 3     ASSESSMENT AND PLAN:  Diet: [name] is currently in the action stage of change. As such, her goal is to continue with weight loss efforts and has agreed to keeping a food journal and adhering to recommended goals of 1450-1600 calories and 95 or more grams of protein daily.   Exercise:  Older adults should determine their level of effort for physical activity relative to their level of fitness.  Behavior Modification:  We discussed the following Behavioral Modification Strategies today: increasing lean protein intake, decreasing simple carbohydrates, increasing vegetables, planning for success, and keep a strict food journal.   Return in about 4 weeks (around 09/15/2023).[name] She was informed of the importance of frequent follow up visits to maximize her success with intensive lifestyle modifications for her multiple health conditions.  Attestation Statements:   Reviewed by clinician on day of visit: allergies, medications, problem list, medical history,  surgical history, family history, social history, and previous encounter notes.     Reuben Likes, MD

## 2023-08-18 NOTE — Assessment & Plan Note (Addendum)
 Lipid panel from last appointment reviewed today.  LDL significantly increased from 117 to 153.  Reviewed patient's food log today and she is getting in on average 35% fat for the day.  We discussed various ways to cut back on food intake after reviewing a  few days of food logs.  She is not on any medication for cholesterol.

## 2023-09-01 ENCOUNTER — Ambulatory Visit: Payer: Self-pay

## 2023-09-01 NOTE — Telephone Encounter (Signed)
  Chief Complaint: blood in urine Symptoms: blood in urine, urgency, frequency, burning sensation with urination Frequency: started yesterday Pertinent Negatives: Patient denies fever Disposition: [] ED /[] Urgent Care (no appt availability in office) / [x] Appointment(In office/virtual)/ []  [facility]/ [] Home Care/ [] Refused Recommended Disposition /[] [facility]/ []  Follow-up with PCP Additional Notes: patient calling with concerns for blood in urine, urinary urgency and frequency and burning sensation. Patient endorses pain level of 4 out of 10. Per protocol, patient is recommended to be seen within 24 hours. Appointment scheduled tomorrow, 09/02/2023 at 11:40 AM with PCP. Patient verbalized understanding of plan and all question answered.    Copied from CRM [id number]. Topic: Clinical - Red Word Triage >> Sep 01, 2023 10:37 AM Rosamond Comes wrote: Red Word that prompted transfer to Nurse Triage: patient calling in UTI blood in urine Reason for Disposition  Blood in urine  (Exception: Could be normal menstrual bleeding.)  Answer Assessment - Initial Assessment Questions 1. COLOR of URINE: "Describe the color of the urine."  (e.g., tea-colored, pink, red, bloody) "Do you have blood clots in your urine?" (e.g., none, pea, grape, small coin)     Urine is pink with no clots 2. ONSET: "When did the bleeding start?"      Patient reports blood in urine possibly last night but urine has definite blood in it today 3. EPISODES: "How many times has there been blood in the urine?" or "How many times today?"     6-7 times 4. PAIN with URINATION: "Is there any pain with passing your urine?" If Yes, ask: "How bad is the pain?"  (Scale 1-10; or mild, moderate, severe)    - MILD: Complains slightly about urination hurting.    - MODERATE: Interferes with normal activities.      - SEVERE: Excruciating, unwilling or unable to urinate because of the pain.      4 out of 10 5. FEVER: "Do you have  a fever?" If Yes, ask: "What is your temperature, how was it measured, and when did it start?"     no 6. ASSOCIATED SYMPTOMS: "Are you passing urine more frequently than usual?"     Yes-frequency and urgency 7. OTHER SYMPTOMS: "Do you have any other symptoms?" (e.g., back/flank pain, abdomen pain, vomiting)     Burning sensation,  Protocols used: Urine - Blood In-A-AH

## 2023-09-02 ENCOUNTER — Other Ambulatory Visit (HOSPITAL_BASED_OUTPATIENT_CLINIC_OR_DEPARTMENT_OTHER): Payer: Self-pay

## 2023-09-02 ENCOUNTER — Encounter: Payer: Self-pay | Admitting: Family Medicine

## 2023-09-02 ENCOUNTER — Ambulatory Visit (INDEPENDENT_AMBULATORY_CARE_PROVIDER_SITE_OTHER): Admitting: Family Medicine

## 2023-09-02 VITALS — BP 110/70 | HR 61 | Temp 97.6°F | Resp 18 | Ht 69.0 in | Wt 214.8 lb

## 2023-09-02 DIAGNOSIS — R300 Dysuria: Secondary | ICD-10-CM

## 2023-09-02 LAB — POC URINALSYSI DIPSTICK (AUTOMATED)
Bilirubin, UA: NEGATIVE
Glucose, UA: NEGATIVE
Ketones, UA: NEGATIVE
Nitrite, UA: NEGATIVE
Protein, UA: POSITIVE — AB
Spec Grav, UA: 1.025 (ref 1.010–1.025)
Urobilinogen, UA: 0.2 U/dL
pH, UA: 5 (ref 5.0–8.0)

## 2023-09-02 MED ORDER — CEPHALEXIN 500 MG PO CAPS
500.0000 mg | ORAL_CAPSULE | Freq: Two times a day (BID) | ORAL | 0 refills | Status: DC
Start: 2023-09-02 — End: 2023-09-18
  Filled 2023-09-02: qty 14, 7d supply, fill #0

## 2023-09-02 NOTE — Progress Notes (Signed)
 Established Patient Office Visit  Subjective   Patient ID: Michelle Pratt, female    DOB: 1955/10/26  Age: 69 y.o. MRN: [id number]  Chief Complaint  Patient presents with   Hematuria    Sxs started Saturday, pt states having burning.     HPI Discussed the use of AI scribe software for clinical note transcription with the patient, who gave verbal consent to proceed.  History of Present Illness Michelle Pratt is a 68 year old female who presents with hematuria and dysuria.  She experienced the onset of hematuria and a burning sensation during urination on Saturday, with symptoms worsening by Monday. She describes the severity as significant, stating 'not my imagination. This is bad.'  No associated symptoms such as discharge, back pain, fever, or stomach pain.   Patient Active Problem List   Diagnosis Date Noted   Pink eye disease of both eyes 08/16/2023   Other hyperlipidemia 07/29/2023   Disorder of eating 03/26/2023   Dyspnea on exertion 02/06/2023   AK (actinic keratosis) 02/05/2023   Seborrheic keratosis 02/05/2023   Family history of skin cancer 02/05/2023   Melanocytic nevi of trunk 02/05/2023   Nevus of scalp 02/05/2023   Rosacea 02/05/2023   Constipation    Depression    Fatty liver    Glaucoma    Heart murmur    Heartburn    Joint pain    Menopause    RUQ pain 06/07/2022   Preventative health care 08/10/2021   Hypothyroidism 08/10/2021   COVID-19 07/10/2021   Depression, major, single episode, moderate (HCC) 02/12/2019   Pain in left knee 03/12/2018   Pain in right knee 03/12/2018   Prediabetes 03/09/2018   Vitamin D  deficiency 03/09/2018   Morbid obesity (HCC) 03/09/2018   Hallux limitus of left foot 06/08/2017   Bunion, right 06/08/2017   Cystitis 10/12/2015   Vaginal laceration 09/13/2014   Acute bacterial sinusitis 05/30/2014   History of anaphylaxis 12/31/2013   Allergic rhinitis 12/31/2013   Cough 12/31/2013   Foot pain, bilateral  10/30/2012   HOT FLASHES 03/02/2010   Asymptomatic postmenopausal status 03/02/2010   NONTOXIC MULTINODULAR GOITER 10/20/2009   Other specified hypothyroidism 10/20/2009   DYSPEPSIA 10/20/2009   Premenstrual tension syndrome 10/20/2009   OTHER ABNORMAL FINDING RADIOLOGICAL EXAM BREAST 10/20/2009   Past Medical History:  Diagnosis Date   Acute bacterial sinusitis 05/30/2014   AK (actinic keratosis) 02/05/2023   Allergic rhinitis 12/31/2013   Bunion, right 06/08/2017   Constipation    Cough 12/31/2013   COVID-19 07/10/2021   Cystitis 10/12/2015   Depression    Depression, major, single episode, moderate (HCC) 02/12/2019   Family history of skin cancer 02/05/2023   Fatty liver    Foot pain, bilateral 10/30/2012   Glaucoma    Hallux limitus of left foot 06/08/2017   Heart murmur    Heartburn    History of anaphylaxis 12/31/2013   HOT FLASHES 03/02/2010   Qualifier: Diagnosis of   By: Tracy Friedlander         Hypothyroidism 08/10/2021   Joint pain    Melanocytic nevi of trunk 02/05/2023   Menopause    Morbid obesity (HCC) 03/09/2018   Starting BMI greater then 30     Nevus of scalp 02/05/2023   NONTOXIC MULTINODULAR GOITER 10/20/2009   Qualifier: Diagnosis of   By: Tracy Friedlander         Other specified hypothyroidism 10/20/2009   Qualifier: Diagnosis of   By:  Lowne DO, Jeanett Antonopoulos         Pain in left knee 03/12/2018   Pain in right knee 03/12/2018   Prediabetes    Preventative health care 08/10/2021   Rosacea 02/05/2023   RUQ pain 06/07/2022   Seborrheic keratosis 02/05/2023   Vaginal laceration 09/13/2014   Vitamin D  deficiency    Past Surgical History:  Procedure Laterality Date   COLONOSCOPY  05/30/2010   Dr.Jacobs   foot Left 2019   dr Clydia Dart-- [facility] ----  first metarsal replacement L foot    oraf     left eyebrow   ORIF ELBOW FRACTURE     TONSILLECTOMY     TUBAL LIGATION     Social History   Tobacco Use   Smoking status: Former    Current  packs/day: 0.00    Average packs/day: 0.8 packs/day for 18.0 years (13.5 ttl pk-yrs)    Types: Cigarettes    Start date: 08/16/1974    Quit date: 08/15/1992    Years since quitting: 31.0   Smokeless tobacco: Never   Tobacco comments:    over 20 years ago  Vaping Use   Vaping status: Never Used  Substance Use Topics   Alcohol use: Yes    Alcohol/week: 5.0 - 7.0 standard drinks of alcohol    Types: 5 - 7 Standard drinks or equivalent per week   Drug use: No   Social History   Socioeconomic History   Marital status: Married    Spouse name: Marijean Shouts   Number of children: 3   Years of education: Not on file   Highest education level: Not on file  Occupational History   Occupation: Teacher, 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[employer]    Comment: pre surgical  Tobacco Use   Smoking status: Former    Current packs/day: 0.00    Average packs/day: 0.8 packs/day for 18.0 years (13.5 ttl pk-yrs)    Types: Cigarettes    Start date: 08/16/1974    Quit date: 08/15/1992    Years since quitting: 31.0   Smokeless tobacco: Never   Tobacco comments:    over 20 years ago  Vaping Use   Vaping status: Never Used  Substance and Sexual Activity   Alcohol use: Yes    Alcohol/week: 5.0 - 7.0 standard drinks of alcohol    Types: 5 - 7 Standard drinks or equivalent per week   Drug use: No   Sexual activity: Yes    Partners: Male  Other Topics Concern   Not on file  Social History Narrative   Exercise--some bike riding   Caffeine Use: daily 1 cup of coffee   Social Drivers of Corporate investment banker Strain: Not on file  Food Insecurity: Not on file  Transportation Needs: Not on file  Physical Activity: Not on file  Stress: Not on file  Social Connections: Not on file  Intimate Partner Violence: Not on file   Family Status  Relation Name Status   Mother  Deceased at age 33       parkinsons   Father  Deceased   Brother  Deceased at age 56       carotid dissection   Neg Hx  (Not Specified)  No  partnership data on file   Family History  Problem Relation Age of Onset   Thyroid  disease Mother    Stroke Mother    Alcohol abuse Mother    Parkinsonism Mother    Hypertension Father  Cancer Father 79       prostate   Hyperlipidemia Father    Stroke Brother    Colon cancer Neg Hx    Allergies  Allergen Reactions   Bee Venom Anaphylaxis and Swelling   Morphine Nausea And Vomiting      Review of Systems  Constitutional:  Negative for chills, fever and malaise/fatigue.  HENT:  Negative for congestion and hearing loss.   Eyes:  Negative for blurred vision and discharge.  Respiratory:  Negative for cough, sputum production and shortness of breath.   Cardiovascular:  Negative for chest pain, palpitations and leg swelling.  Gastrointestinal:  Negative for abdominal pain, blood in stool, constipation, diarrhea, heartburn, nausea and vomiting.  Genitourinary:  Positive for dysuria and frequency. Negative for hematuria and urgency.  Musculoskeletal:  Negative for back pain, falls and myalgias.  Skin:  Negative for rash.  Neurological:  Negative for dizziness, sensory change, loss of consciousness, weakness and headaches.  Endo/Heme/Allergies:  Negative for environmental allergies. Does not bruise/bleed easily.  Psychiatric/Behavioral:  Negative for depression and suicidal ideas. The patient is not nervous/anxious and does not have insomnia.       Objective:     BP 110/70 (BP Location: Right Arm, Patient Position: Sitting, Cuff Size: Normal)   Pulse 61   Temp 97.6 F (36.4 C) (Oral)   Resp 18   Ht 5\' 9"  (1.753 m)   Wt 214 lb 12.8 oz (97.4 kg)   SpO2 98%   BMI 31.72 kg/m  BP Readings from Last 3 Encounters:  09/02/23 110/70  08/18/23 116/72  07/17/23 120/60   Wt Readings from Last 3 Encounters:  09/02/23 214 lb 12.8 oz (97.4 kg)  08/18/23 208 lb (94.3 kg)  07/17/23 209 lb (94.8 kg)   SpO2 Readings from Last 3 Encounters:  09/02/23 98%  08/18/23 96%  07/17/23  95%      Physical Exam Vitals and nursing note reviewed.  Constitutional:      General: She is not in acute distress.    Appearance: Normal appearance. She is well-developed.  HENT:     Head: Normocephalic and atraumatic.  Eyes:     General: No scleral icterus.       Right eye: No discharge.        Left eye: No discharge.  Cardiovascular:     Rate and Rhythm: Normal rate and regular rhythm.     Heart sounds: No murmur heard. Pulmonary:     Effort: Pulmonary effort is normal. No respiratory distress.     Breath sounds: Normal breath sounds.  Abdominal:     Tenderness: There is no abdominal tenderness. There is no right CVA tenderness, left CVA tenderness, guarding or rebound.  Musculoskeletal:        General: Normal range of motion.     Cervical back: Normal range of motion and neck supple.     Right lower leg: No edema.     Left lower leg: No edema.  Skin:    General: Skin is warm and dry.  Neurological:     Mental Status: She is alert and oriented to person, place, and time.  Psychiatric:        Mood and Affect: Mood normal.        Behavior: Behavior normal.        Thought Content: Thought content normal.        Judgment: Judgment normal.      Results for orders placed or performed in visit on  09/02/23  POCT Urinalysis Dipstick (Automated)  Result Value Ref Range   Color, UA yellow    Clarity, UA cloudy    Glucose, UA Negative Negative   Bilirubin, UA neg    Ketones, UA neg    Spec Grav, UA 1.025 1.010 - 1.025   Blood, UA large (A)    pH, UA 5.0 5.0 - 8.0   Protein, UA Positive (A) Negative   Urobilinogen, UA 0.2 0.2 or 1.0 E.U./dL   Nitrite, UA neg    Leukocytes, UA Trace (A) Negative    Last CBC Lab Results  Component Value Date   WBC 4.6 02/05/2023   HGB 14.6 02/05/2023   HCT 45.0 02/05/2023   MCV 95 02/05/2023   MCH 30.7 02/05/2023   RDW 12.9 02/05/2023   PLT 248 02/05/2023   Last metabolic panel Lab Results  Component Value Date   GLUCOSE  108 (H) 07/17/2023   NA 143 07/17/2023   K 4.7 07/17/2023   CL 102 07/17/2023   CO2 24 07/17/2023   BUN 22 07/17/2023   CREATININE 0.94 07/17/2023   EGFR 67 07/17/2023   CALCIUM 9.9 07/17/2023   PROT 6.5 07/17/2023   ALBUMIN 4.6 07/17/2023   LABGLOB 1.9 07/17/2023   AGRATIO 3.0 (H) 07/29/2018   BILITOT 0.4 07/17/2023   ALKPHOS 73 07/17/2023   AST 20 07/17/2023   ALT 34 (H) 07/17/2023   Last lipids Lab Results  Component Value Date   CHOL 243 (H) 07/17/2023   HDL 67 07/17/2023   LDLCALC 153 (H) 07/17/2023   LDLDIRECT 133.3 03/11/2012   TRIG 133 07/17/2023   CHOLHDL 3 08/23/2022   Last hemoglobin A1c Lab Results  Component Value Date   HGBA1C 5.7 (H) 07/17/2023   Last thyroid  functions Lab Results  Component Value Date   TSH 1.360 02/05/2023   T3TOTAL 100 02/02/2018   T4TOTAL 9.3 02/05/2023   Last vitamin D  Lab Results  Component Value Date   VD25OH 53.5 07/17/2023   Last vitamin B12 and Folate Lab Results  Component Value Date   VITAMINB12 475 02/02/2018   FOLATE 10.4 02/02/2018      The 10-year ASCVD risk score (Arnett DK, et al., 2019) is: 5.3%    Assessment & Plan:   Problem List Items Addressed This Visit   None Visit Diagnoses       Dysuria    -  Primary   Relevant Medications   cephALEXin  (KEFLEX ) 500 MG capsule   Other Relevant Orders   POCT Urinalysis Dipstick (Automated) (Completed)   Urine Culture     Assessment and Plan Assessment & Plan Hematuria   Hematuria has been present since Saturday and is worsening. There are no associated symptoms like discharge, back pain, fever, or abdominal pain. Differential diagnosis includes urinary tract infection or other underlying conditions. She considered taking an old cephalosporin antibiotic but decided against it due to concerns about potency and effectiveness. Perform urinalysis to evaluate for infection or other underlying conditions.  Ua--  + blood, leuks  No follow-ups on file.     Marcello Tuzzolino R Lowne Chase, DO

## 2023-09-04 LAB — URINE CULTURE
MICRO NUMBER:: 16359246
SPECIMEN QUALITY:: ADEQUATE

## 2023-09-05 ENCOUNTER — Encounter: Payer: Self-pay | Admitting: Family Medicine

## 2023-09-06 ENCOUNTER — Other Ambulatory Visit (INDEPENDENT_AMBULATORY_CARE_PROVIDER_SITE_OTHER): Payer: Self-pay | Admitting: Family Medicine

## 2023-09-06 DIAGNOSIS — F5089 Other specified eating disorder: Secondary | ICD-10-CM

## 2023-09-08 ENCOUNTER — Telehealth: Payer: Self-pay | Admitting: Family Medicine

## 2023-09-08 NOTE — Telephone Encounter (Signed)
 Copied from CRM [id number]. Topic: Medicare AWV >> Sep 08, 2023  9:14 AM Juliana Ocean wrote: Reason for CRM: Called LVM 09/08/2023 to schedule AWV appt 09/11/22  Rosalee Collins; Care Guide Ambulatory Clinical Support [place] [facility] Direct Dial: [phone]

## 2023-09-09 ENCOUNTER — Other Ambulatory Visit (INDEPENDENT_AMBULATORY_CARE_PROVIDER_SITE_OTHER): Payer: Self-pay | Admitting: Family Medicine

## 2023-09-09 ENCOUNTER — Other Ambulatory Visit (HOSPITAL_COMMUNITY): Payer: Self-pay

## 2023-09-09 DIAGNOSIS — F5089 Other specified eating disorder: Secondary | ICD-10-CM

## 2023-09-11 ENCOUNTER — Ambulatory Visit

## 2023-09-18 ENCOUNTER — Ambulatory Visit (INDEPENDENT_AMBULATORY_CARE_PROVIDER_SITE_OTHER): Admitting: Family Medicine

## 2023-09-18 ENCOUNTER — Encounter (INDEPENDENT_AMBULATORY_CARE_PROVIDER_SITE_OTHER): Payer: Self-pay | Admitting: Family Medicine

## 2023-09-18 ENCOUNTER — Other Ambulatory Visit (HOSPITAL_COMMUNITY): Payer: Self-pay

## 2023-09-18 VITALS — BP 126/63 | HR 61 | Temp 98.0°F | Ht 69.0 in | Wt 208.0 lb

## 2023-09-18 DIAGNOSIS — E7849 Other hyperlipidemia: Secondary | ICD-10-CM

## 2023-09-18 DIAGNOSIS — Z6831 Body mass index (BMI) 31.0-31.9, adult: Secondary | ICD-10-CM

## 2023-09-18 DIAGNOSIS — Z6830 Body mass index (BMI) 30.0-30.9, adult: Secondary | ICD-10-CM | POA: Diagnosis not present

## 2023-09-18 DIAGNOSIS — F5089 Other specified eating disorder: Secondary | ICD-10-CM | POA: Diagnosis not present

## 2023-09-18 MED ORDER — BUPROPION HCL ER (XL) 300 MG PO TB24
300.0000 mg | ORAL_TABLET | Freq: Every day | ORAL | 0 refills | Status: DC
  Filled 2023-09-18: qty 90, 90d supply, fill #0

## 2023-09-18 NOTE — Progress Notes (Signed)
 SUBJECTIVE:  Chief Complaint: Obesity  Interim History: Patient has been trying to stay mindful of cholesterol intake.  She has been a bit over in terms of her calorie intake she thinks.  She is going to [employer] for Mother's Day this weekend.  Upon review of her food log she has been consistently logging her intake but isn't always doing the entire day (missing out at dinner).    Michelle Pratt is here to discuss her progress with her obesity treatment plan. She is on the keeping a food journal and adhering to recommended goals of 1450-1600 calories and 95 grams of protein and states she is following her eating plan approximately 100 % of the time. She states she is exercising 60 minutes 2 times per week and is walking 30-45 for 1-2 times per week.   OBJECTIVE: Visit Diagnoses: Problem List Items Addressed This Visit       Other   Morbid obesity (HCC)   Anthropometric Measurements Height: 5\' 9"  (1.753 m) Weight: 208 lb (94.3 kg) BMI (Calculated): 30.7 Weight at Last Visit: 208 lb Weight Lost Since Last Visit: 0 Weight Gained Since Last Visit: 0 Starting Weight: 214 lb Total Weight Loss (lbs): 6 lb (2.722 kg) Body Composition  Body Fat %: 42.1 % Fat Mass (lbs): 88 lbs Muscle Mass (lbs): 114.6 lbs Total Body Water (lbs): 75.2 lbs Visceral Fat Rating : 12 Other Clinical Data Today's Visit #: 9 Starting Date: 02/05/24 Comments: Cat 3       Disorder of eating - Primary   Currently on Wellbutrin  with improvement in energy and cravings control.  She needs a refill of wellbutrin  today.  No change in dose.      Relevant Medications   buPROPion  (WELLBUTRIN  XL) 300 MG 24 hr tablet   Other hyperlipidemia   Patient has been working on limiting her saturated fat intake. Will need repeat labs in 2-3 months to ensure improvement in LDL.      Other Visit Diagnoses       Obesity with starting BMI of 31.6         BMI 30.0-30.9,adult           No data recorded       09/18/2023     9:00 AM 09/02/2023   11:41 AM 08/18/2023    9:00 AM  Vitals with BMI  Height 5\' 9"  5\' 9"  5\' 9"   Weight 208 lbs 214 lbs 13 oz 208 lbs  BMI 30.7 31.71 30.7  Systolic 126 110 440  Diastolic 63 70 72  Pulse 61 61 59      ASSESSMENT AND PLAN:  Diet: Michelle Pratt is currently in the action stage of change. As such, her goal is to continue with weight loss efforts and has agreed to keeping a food journal and adhering to recommended goals of 1400-1500 calories and 95 or more grams protein daily. Patient to start food log or journaling meal plan.  The initial goal will be to habitually log or journal for at least 4 days a week.  The expectation it that patient may not initially meet calorie or protein goals as the nturitional understanding of food intake is begun.  We discussed the 10:1 ratio when reading a food label.  Patient agrees to keep a food log either electronically or on paper and bring to the next appointment to be able to dissect and discuss it with provider.    Exercise:  Older adults should follow the adult guidelines. When older adults  cannot meet the adult guidelines, they should be as physically active as their abilities and conditions will allow.  She does think she can continue her current physical activity over the next few weeks.   Behavior Modification:  We discussed the following Behavioral Modification Strategies today: increasing lean protein intake, decreasing simple carbohydrates, increasing vegetables, meal planning and cooking strategies, keeping healthy foods in the home, and keep a strict food journal.   Return in about 4 weeks (around 10/16/2023).   She was informed of the importance of frequent follow up visits to maximize her success with intensive lifestyle modifications for her multiple health conditions.  Attestation Statements:   Reviewed by clinician on day of visit: allergies, medications, problem list, medical history, surgical history, family history, social  history, and previous encounter notes.     Donaciano Frizzle, MD

## 2023-09-29 NOTE — Assessment & Plan Note (Signed)
 Currently on Wellbutrin  with improvement in energy and cravings control.  She needs a refill of wellbutrin  today.  No change in dose.

## 2023-09-29 NOTE — Assessment & Plan Note (Signed)
 Anthropometric Measurements Height: 5\' 9"  (1.753 m) Weight: 208 lb (94.3 kg) BMI (Calculated): 30.7 Weight at Last Visit: 208 lb Weight Lost Since Last Visit: 0 Weight Gained Since Last Visit: 0 Starting Weight: 214 lb Total Weight Loss (lbs): 6 lb (2.722 kg) Body Composition  Body Fat %: 42.1 % Fat Mass (lbs): 88 lbs Muscle Mass (lbs): 114.6 lbs Total Body Water (lbs): 75.2 lbs Visceral Fat Rating : 12 Other Clinical Data Today's Visit #: 9 Starting Date: 02/05/24 Comments: Cat 3

## 2023-09-29 NOTE — Assessment & Plan Note (Signed)
 Patient has been working on limiting her saturated fat intake. Will need repeat labs in 2-3 months to ensure improvement in LDL.

## 2023-10-05 ENCOUNTER — Other Ambulatory Visit: Payer: Self-pay | Admitting: Family Medicine

## 2023-10-05 DIAGNOSIS — E039 Hypothyroidism, unspecified: Secondary | ICD-10-CM

## 2023-10-07 ENCOUNTER — Other Ambulatory Visit (HOSPITAL_COMMUNITY): Payer: Self-pay

## 2023-10-07 MED ORDER — LEVOTHYROXINE SODIUM 125 MCG PO TABS
125.0000 ug | ORAL_TABLET | Freq: Every day | ORAL | 1 refills | Status: DC
  Filled 2023-10-07: qty 90, 90d supply, fill #0
  Filled 2024-01-08: qty 90, 90d supply, fill #1

## 2023-10-17 ENCOUNTER — Other Ambulatory Visit (HOSPITAL_COMMUNITY): Payer: Self-pay

## 2023-10-17 ENCOUNTER — Other Ambulatory Visit: Payer: Self-pay | Admitting: Family Medicine

## 2023-10-17 DIAGNOSIS — F418 Other specified anxiety disorders: Secondary | ICD-10-CM

## 2023-10-17 MED ORDER — FLUOXETINE HCL 20 MG PO CAPS
20.0000 mg | ORAL_CAPSULE | Freq: Every day | ORAL | 0 refills | Status: DC
  Filled 2023-10-17: qty 90, 90d supply, fill #0

## 2023-10-23 LAB — LAB REPORT - SCANNED
A1c: 5.5
EGFR: 57

## 2023-10-28 ENCOUNTER — Other Ambulatory Visit (HOSPITAL_COMMUNITY): Payer: Self-pay

## 2023-10-28 ENCOUNTER — Other Ambulatory Visit: Payer: Self-pay

## 2023-10-28 MED ORDER — ESTRADIOL 0.025 MG/24HR TD PTTW
1.0000 | MEDICATED_PATCH | TRANSDERMAL | 1 refills | Status: DC
  Filled 2023-10-28: qty 8, 28d supply, fill #0

## 2023-10-28 MED ORDER — PROGESTERONE MICRONIZED 100 MG PO CAPS
100.0000 mg | ORAL_CAPSULE | Freq: Every day | ORAL | 1 refills | Status: DC
  Filled 2023-10-28: qty 30, 30d supply, fill #0

## 2023-10-30 ENCOUNTER — Ambulatory Visit (INDEPENDENT_AMBULATORY_CARE_PROVIDER_SITE_OTHER): Admitting: Family Medicine

## 2023-11-06 ENCOUNTER — Other Ambulatory Visit (HOSPITAL_COMMUNITY): Payer: Self-pay

## 2023-11-26 ENCOUNTER — Other Ambulatory Visit (HOSPITAL_COMMUNITY): Payer: Self-pay

## 2023-11-26 MED ORDER — ESTRADIOL 0.025 MG/24HR TD PTTW
1.0000 | MEDICATED_PATCH | TRANSDERMAL | 3 refills | Status: DC
  Filled 2023-11-26: qty 8, 28d supply, fill #0

## 2023-11-26 MED ORDER — PROGESTERONE MICRONIZED 100 MG PO CAPS
100.0000 mg | ORAL_CAPSULE | Freq: Every day | ORAL | 1 refills | Status: DC
  Filled 2023-11-26: qty 90, 90d supply, fill #0
  Filled 2024-02-18: qty 90, 90d supply, fill #1

## 2023-12-10 ENCOUNTER — Other Ambulatory Visit: Payer: Self-pay

## 2023-12-10 ENCOUNTER — Other Ambulatory Visit (HOSPITAL_COMMUNITY): Payer: Self-pay

## 2023-12-10 MED ORDER — ESTRADIOL 0.0375 MG/24HR TD PTTW
1.0000 | MEDICATED_PATCH | TRANSDERMAL | 1 refills | Status: DC
  Filled 2023-12-10: qty 8, 28d supply, fill #0
  Filled 2024-01-16: qty 8, 28d supply, fill #1

## 2023-12-10 MED ORDER — BUPROPION HCL ER (XL) 300 MG PO TB24
300.0000 mg | ORAL_TABLET | Freq: Every morning | ORAL | 1 refills | Status: DC
  Filled 2023-12-10: qty 30, 30d supply, fill #0
  Filled 2024-01-16: qty 30, 30d supply, fill #1

## 2024-01-09 ENCOUNTER — Other Ambulatory Visit (HOSPITAL_COMMUNITY): Payer: Self-pay

## 2024-01-16 ENCOUNTER — Other Ambulatory Visit (HOSPITAL_COMMUNITY): Payer: Self-pay

## 2024-01-16 ENCOUNTER — Other Ambulatory Visit: Payer: Self-pay | Admitting: Family Medicine

## 2024-01-16 ENCOUNTER — Other Ambulatory Visit: Payer: Self-pay

## 2024-01-16 DIAGNOSIS — F418 Other specified anxiety disorders: Secondary | ICD-10-CM

## 2024-01-16 MED ORDER — FLUOXETINE HCL 20 MG PO CAPS
20.0000 mg | ORAL_CAPSULE | Freq: Every day | ORAL | 0 refills | Status: DC
  Filled 2024-01-16: qty 30, 30d supply, fill #0

## 2024-02-09 ENCOUNTER — Encounter: Payer: Self-pay | Admitting: Family Medicine

## 2024-02-09 ENCOUNTER — Ambulatory Visit (INDEPENDENT_AMBULATORY_CARE_PROVIDER_SITE_OTHER): Admitting: Family Medicine

## 2024-02-09 ENCOUNTER — Other Ambulatory Visit (HOSPITAL_COMMUNITY): Payer: Self-pay

## 2024-02-09 VITALS — BP 102/72 | HR 63 | Temp 98.0°F | Resp 18 | Ht 69.0 in | Wt 200.2 lb

## 2024-02-09 DIAGNOSIS — R413 Other amnesia: Secondary | ICD-10-CM | POA: Diagnosis not present

## 2024-02-09 DIAGNOSIS — F418 Other specified anxiety disorders: Secondary | ICD-10-CM

## 2024-02-09 MED ORDER — FLUOXETINE HCL 20 MG PO CAPS
20.0000 mg | ORAL_CAPSULE | Freq: Every day | ORAL | 3 refills | Status: AC
  Filled 2024-02-09: qty 90, 90d supply, fill #0
  Filled 2024-05-18: qty 90, 90d supply, fill #1

## 2024-02-09 NOTE — Progress Notes (Signed)
 Subjective:    Patient ID: Michelle Pratt, female    DOB: [birth year], 68 y.o.   MRN: [id number]  Chief Complaint  Patient presents with   Depression   Anxiety   Follow-up    HPI Patient is in today for f/u.  Discussed the use of AI scribe software for clinical note transcription with the patient, who gave verbal consent to proceed.  History of Present Illness Michelle Pratt is a 68 year old female who presents with concerns about memory loss and medication management.  She is experiencing memory loss, particularly with remembering names and conversations. She passed memory tests a year ago but feels her memory has declined since she stopped working. Her concerns are heightened due to her sister's recent diagnosis of CREST syndrome and her mother's history of Parkinson's disease.  She is managing depression and anxiety with fluoxetine  and requests a refill as she is running low.  She is on semaglutide  for weight loss, receiving it as a compounded injection at [facility]. She has lost 14 pounds and feels better at this weight. However, she experiences constipation as a side effect, which worsened during a recent cruise. She uses Miralax and fast chews to manage this, but is still adjusting the dosage to avoid excessive bowel movements.    Past Medical History:  Diagnosis Date   Acute bacterial sinusitis 05/30/2014   AK (actinic keratosis) 02/05/2023   Allergic rhinitis 12/31/2013   Bunion, right 06/08/2017   Constipation    Cough 12/31/2013   COVID-19 07/10/2021   Cystitis 10/12/2015   Depression    Depression, major, single episode, moderate (HCC) 02/12/2019   Family history of skin cancer 02/05/2023   Fatty liver    Foot pain, bilateral 10/30/2012   Glaucoma    Hallux limitus of left foot 06/08/2017   Heart murmur    Heartburn    History of anaphylaxis 12/31/2013   HOT FLASHES 03/02/2010   Qualifier: Diagnosis of   By: Antonio ROSALEA Rockers          Hypothyroidism 08/10/2021   Joint pain    Melanocytic nevi of trunk 02/05/2023   Menopause    Morbid obesity (HCC) 03/09/2018   Starting BMI greater then 30     Nevus of scalp 02/05/2023   NONTOXIC MULTINODULAR GOITER 10/20/2009   Qualifier: Diagnosis of   By: Antonio ROSALEA Rockers         Other specified hypothyroidism 10/20/2009   Qualifier: Diagnosis of   By: Antonio ROSALEA Rockers         Pain in left knee 03/12/2018   Pain in right knee 03/12/2018   Prediabetes    Preventative health care 08/10/2021   Rosacea 02/05/2023   RUQ pain 06/07/2022   Seborrheic keratosis 02/05/2023   Vaginal laceration 09/13/2014   Vitamin D  deficiency     Past Surgical History:  Procedure Laterality Date   COLONOSCOPY  05/30/2010   Dr.Jacobs   foot Left 2019   dr gershon-- [facility] ----  first metarsal replacement L foot    oraf     left eyebrow   ORIF ELBOW FRACTURE     TONSILLECTOMY     TUBAL LIGATION      Family History  Problem Relation Age of Onset   Thyroid  disease Mother    Stroke Mother    Alcohol abuse Mother    Parkinsonism Mother  Hypertension Father    Cancer Father 80       prostate   Hyperlipidemia Father    Stroke Brother    Colon cancer Neg Hx     Social History   Socioeconomic History   Marital status: Married    Spouse name: Gordy   Number of children: 3   Years of education: Not on file   Highest education level: Not on file  Occupational History   Occupation: Teacher, 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[employer]    Comment: pre surgical  Tobacco Use   Smoking status: Former    Current packs/day: 0.00    Average packs/day: 0.8 packs/day for 18.0 years (13.5 ttl pk-yrs)    Types: Cigarettes    Start date: 08/16/1974    Quit date: 08/15/1992    Years since quitting: 31.5   Smokeless tobacco: Never   Tobacco comments:    over 20 years ago  Vaping Use   Vaping status: Never Used  Substance and Sexual Activity   Alcohol use: Yes    Alcohol/week: 5.0 - 7.0  standard drinks of alcohol    Types: 5 - 7 Standard drinks or equivalent per week   Drug use: No   Sexual activity: Yes    Partners: Male  Other Topics Concern   Not on file  Social History Narrative   Exercise--some bike riding   Caffeine Use: daily 1 cup of coffee   Social Drivers of Corporate investment banker Strain: Not on file  Food Insecurity: Not on file  Transportation Needs: Not on file  Physical Activity: Not on file  Stress: Not on file  Social Connections: Not on file  Intimate Partner Violence: Not on file    Outpatient Medications Prior to Visit  Medication Sig Dispense Refill   acetaminophen (TYLENOL) 325 MG tablet Take 650 mg by mouth every 6 (six) hours as needed for mild pain or moderate pain.     buPROPion  (WELLBUTRIN  XL) 300 MG 24 hr tablet Take 1 tablet (300 mg total) by mouth in the morning. 30 tablet 1   cetirizine  (ZYRTEC ) 10 MG tablet Take 10 mg by mouth as needed for allergies or rhinitis.     Cholecalciferol (VITAMIN D3) 125 MCG (5000 UT) CAPS Take 1 capsule (5,000 Units total) by mouth daily.     EPINEPHrine  0.3 mg/0.3 mL IJ SOAJ injection Inject 0.3 mg into the muscle once as needed (allergic reaction). 2 each 0   estradiol  (VIVELLE -DOT) 0.0375 MG/24HR Place 1 patch onto the skin two times a week - rotate sites of application 8 patch 1   levothyroxine  (SYNTHROID ) 125 MCG tablet Take 1 tablet (125 mcg total) by mouth daily before breakfast. 90 tablet 1   Melatonin 3 MG CAPS Take 3 mg by mouth as needed (sleep).     Multiple Vitamin (MULTI VITAMIN PO) Take 1 tablet by mouth daily.     progesterone  (PROMETRIUM ) 100 MG capsule Take 1 capsule (100 mg total) by mouth at bedtime. 90 capsule 1   semaglutide -weight management (WEGOVY ) 0.25 MG/0.5ML SOAJ SQ injection Inject 0.5 mg into the skin.     estradiol  (VIVELLE -DOT) 0.025 MG/24HR Place 1 patch onto the skin 2 (two) times a week. Rotate sites of application. 8 patch 1   estradiol  (VIVELLE -DOT) 0.025  MG/24HR Place 1 patch onto the skin two times a week, rotate sites of application 8 patch 3   FLUoxetine  (PROZAC ) 20 MG capsule Take 1 capsule (20 mg total)  by mouth daily. Needs appt 30 capsule 0   progesterone  (PROMETRIUM ) 100 MG capsule Take 1 capsule (100 mg total) by mouth at bedtime. 30 capsule 1   Facility-Administered Medications Prior to Visit  Medication Dose Route Frequency Provider Last Rate Last Admin   albuterol  (PROVENTIL ) (2.5 MG/3ML) 0.083% nebulizer solution 2.5 mg  2.5 mg Nebulization Once Jason Leita Repine, FNP        Allergies  Allergen Reactions   Bee Venom Anaphylaxis and Swelling   Morphine Nausea And Vomiting    Review of Systems  Constitutional:  Negative for fever and malaise/fatigue.  HENT:  Negative for congestion.   Eyes:  Negative for blurred vision.  Respiratory:  Negative for cough and shortness of breath.   Cardiovascular:  Negative for chest pain, palpitations and leg swelling.  Gastrointestinal:  Negative for vomiting.  Musculoskeletal:  Negative for back pain.  Skin:  Negative for rash.  Neurological:  Negative for loss of consciousness and headaches.       Objective:    Physical Exam Vitals and nursing note reviewed.  Constitutional:      General: She is not in acute distress.    Appearance: Normal appearance. She is well-developed.  HENT:     Head: Normocephalic and atraumatic.  Eyes:     General: No scleral icterus.       Right eye: No discharge.        Left eye: No discharge.  Cardiovascular:     Rate and Rhythm: Normal rate and regular rhythm.     Heart sounds: No murmur heard. Pulmonary:     Effort: Pulmonary effort is normal. No respiratory distress.     Breath sounds: Normal breath sounds.  Musculoskeletal:        General: Normal range of motion.     Cervical back: Normal range of motion and neck supple.     Right lower leg: No edema.     Left lower leg: No edema.  Skin:    General: Skin is warm and dry.   Neurological:     Mental Status: She is alert and oriented to person, place, and time.  Psychiatric:        Mood and Affect: Mood normal.        Behavior: Behavior normal.        Thought Content: Thought content normal.        Judgment: Judgment normal.     BP 102/72 (BP Location: Left Arm, Patient Position: Sitting, Cuff Size: Normal)   Pulse 63   Temp 98 F (36.7 C) (Oral)   Resp 18   Ht 5' 9 (1.753 m)   Wt 200 lb 3.2 oz (90.8 kg)   SpO2 99%   BMI 29.56 kg/m  Wt Readings from Last 3 Encounters:  02/09/24 200 lb 3.2 oz (90.8 kg)  09/18/23 208 lb (94.3 kg)  09/02/23 214 lb 12.8 oz (97.4 kg)    Diabetic Foot Exam - Simple   No data filed    Lab Results  Component Value Date   WBC 4.6 02/05/2023   HGB 14.6 02/05/2023   HCT 45.0 02/05/2023   PLT 248 02/05/2023   GLUCOSE 108 (H) 07/17/2023   CHOL 243 (H) 07/17/2023   TRIG 133 07/17/2023   HDL 67 07/17/2023   LDLDIRECT 133.3 03/11/2012   LDLCALC 153 (H) 07/17/2023   ALT 34 (H) 07/17/2023   AST 20 07/17/2023   NA 143 07/17/2023   K 4.7 07/17/2023   CL  102 07/17/2023   CREATININE 0.94 07/17/2023   BUN 22 07/17/2023   CO2 24 07/17/2023   TSH 1.360 02/05/2023   HGBA1C 5.7 (H) 07/17/2023    Lab Results  Component Value Date   TSH 1.360 02/05/2023   Lab Results  Component Value Date   WBC 4.6 02/05/2023   HGB 14.6 02/05/2023   HCT 45.0 02/05/2023   MCV 95 02/05/2023   PLT 248 02/05/2023   Lab Results  Component Value Date   NA 143 07/17/2023   K 4.7 07/17/2023   CO2 24 07/17/2023   GLUCOSE 108 (H) 07/17/2023   BUN 22 07/17/2023   CREATININE 0.94 07/17/2023   BILITOT 0.4 07/17/2023   ALKPHOS 73 07/17/2023   AST 20 07/17/2023   ALT 34 (H) 07/17/2023   PROT 6.5 07/17/2023   ALBUMIN 4.6 07/17/2023   CALCIUM 9.9 07/17/2023   EGFR 67 07/17/2023   GFR 81.88 08/23/2022   Lab Results  Component Value Date   CHOL 243 (H) 07/17/2023   Lab Results  Component Value Date   HDL 67 07/17/2023   Lab  Results  Component Value Date   LDLCALC 153 (H) 07/17/2023   Lab Results  Component Value Date   TRIG 133 07/17/2023   Lab Results  Component Value Date   CHOLHDL 3 08/23/2022   Lab Results  Component Value Date   HGBA1C 5.7 (H) 07/17/2023       Assessment & Plan:  Depression with anxiety -     FLUoxetine  HCl; Take 1 capsule (20 mg total) by mouth daily. Needs appt  Dispense: 90 capsule; Refill: 3  Assessment and Plan Assessment & Plan Memory loss   She reports concerns about memory loss, including difficulty remembering names and conversations, and is worried about developing Alzheimer's disease. There is no indication that family members have noticed significant changes. She passed memory tests a year ago but remains concerned. Her family history includes Parkinson's disease and CREST syndrome in her sister. Order an MRI of the brain to evaluate for structural causes of memory loss and consider referral to a neuropsychiatrist for formal memory testing.  Depression and anxiety   She reports ongoing depression and anxiety and requests a refill of fluoxetine . Refill fluoxetine  prescription.  Obesity treated with semaglutide    She is being treated with semaglutide  for weight loss and has lost 14 pounds. She receives medication through [facility], where it is compounded and administered via injection. She reports improved well-being at her current weight. The cost of treatment is $90 per visit, which includes a $50 copay and $40 for the injection.  Constipation due to medication   She experiences constipation as a side effect of semaglutide . She has been using Miralax and Miralax fast chews to manage symptoms but is still adjusting the dosage. She inquires about daily management options, including magnesium supplements. Advise a trial of magnesium supplements for constipation and discuss daily use of Miralax, Ducalex, or Senokot as potential options for managing  constipation.    Lesa Vandall R Lowne Chase, DO

## 2024-02-10 ENCOUNTER — Ambulatory Visit: Payer: Self-pay | Admitting: Family Medicine

## 2024-02-10 ENCOUNTER — Inpatient Hospital Stay: Admission: RE | Admit: 2024-02-10 | Discharge: 2024-02-10 | Attending: Family Medicine | Admitting: Family Medicine

## 2024-02-10 DIAGNOSIS — R413 Other amnesia: Secondary | ICD-10-CM

## 2024-02-16 ENCOUNTER — Other Ambulatory Visit (HOSPITAL_COMMUNITY): Payer: Self-pay

## 2024-02-18 ENCOUNTER — Other Ambulatory Visit (HOSPITAL_COMMUNITY): Payer: Self-pay

## 2024-02-18 ENCOUNTER — Ambulatory Visit (INDEPENDENT_AMBULATORY_CARE_PROVIDER_SITE_OTHER): Admitting: *Deleted

## 2024-02-18 VITALS — Ht 69.0 in | Wt 200.0 lb

## 2024-02-18 DIAGNOSIS — Z780 Asymptomatic menopausal state: Secondary | ICD-10-CM | POA: Diagnosis not present

## 2024-02-18 DIAGNOSIS — Z0000 Encounter for general adult medical examination without abnormal findings: Secondary | ICD-10-CM

## 2024-02-18 DIAGNOSIS — Z1231 Encounter for screening mammogram for malignant neoplasm of breast: Secondary | ICD-10-CM

## 2024-02-18 MED ORDER — ESTRADIOL 0.0375 MG/24HR TD PTTW
1.0000 | MEDICATED_PATCH | TRANSDERMAL | 1 refills | Status: DC
  Filled 2024-02-18: qty 8, 28d supply, fill #0

## 2024-02-18 NOTE — Progress Notes (Signed)
 Please attest this visit in the absence of patient primary care provider.    Subjective:   Michelle Pratt is a 68 y.o. who presents for a Medicare Wellness preventive visit.  As a reminder, Annual Wellness Visits don't include a physical exam, and some assessments may be limited, especially if this visit is performed virtually. We may recommend an in-person follow-up visit with your provider if needed.  Visit Complete: Virtual I connected with  [name] on 02/18/24 by a audio enabled telemedicine application and verified that I am speaking with the correct person using two identifiers.  Patient Location: Home  Provider Location: Office/Clinic  I discussed the limitations of evaluation and management by telemedicine. The patient expressed understanding and agreed to proceed.  Vital Signs: Because this visit was a virtual/telehealth visit, some criteria may be missing or patient reported. Any vitals not documented were not able to be obtained and vitals that have been documented are patient reported.  VideoDeclined- This patient declined Librarian, academic. Therefore the visit was completed with audio only.  Persons Participating in Visit: Patient.  AWV Questionnaire: No: Patient Medicare AWV questionnaire was not completed prior to this visit.  Cardiac Risk Factors include: advanced age (>21men, >36 women);dyslipidemia;obesity (BMI >30kg/m2)     Objective:    Today's Vitals   02/18/24 1105  Weight: 200 lb (90.7 kg)  Height: 5' 9 (1.753 m)   Body mass index is 29.53 kg/m.     02/18/2024   11:15 AM 09/04/2014    8:27 PM  Advanced Directives  Does Patient Have a Medical Advance Directive? Yes No   Type of Estate agent of [place];Living will   Does patient want to make changes to medical advance directive? No - Guardian declined   Copy of Healthcare Power of Attorney in Chart? No - copy requested      Data saved  with a previous flowsheet row definition    Current Medications (verified) Outpatient Encounter Medications as of 02/18/2024  Medication Sig   acetaminophen (TYLENOL) 325 MG tablet Take 650 mg by mouth every 6 (six) hours as needed for mild pain or moderate pain.   buPROPion  (WELLBUTRIN  XL) 300 MG 24 hr tablet Take 1 tablet (300 mg total) by mouth in the morning.   cetirizine  (ZYRTEC ) 10 MG tablet Take 10 mg by mouth as needed for allergies or rhinitis.   Cholecalciferol (VITAMIN D3) 125 MCG (5000 UT) CAPS Take 1 capsule (5,000 Units total) by mouth daily.   EPINEPHrine  0.3 mg/0.3 mL IJ SOAJ injection Inject 0.3 mg into the muscle once as needed (allergic reaction).   [START ON 02/19/2024] estradiol  (VIVELLE -DOT) 0.0375 MG/24HR Place 1 patch onto the skin two times a week - rotate sites of application   FLUoxetine  (PROZAC ) 20 MG capsule Take 1 capsule (20 mg total) by mouth daily. Needs appt   levothyroxine  (SYNTHROID ) 125 MCG tablet Take 1 tablet (125 mcg total) by mouth daily before breakfast.   Melatonin 3 MG CAPS Take 3 mg by mouth as needed (sleep).   Multiple Vitamin (MULTI VITAMIN PO) Take 1 tablet by mouth daily.   progesterone  (PROMETRIUM ) 100 MG capsule Take 1 capsule (100 mg total) by mouth at bedtime.   semaglutide -weight management (WEGOVY ) 0.25 MG/0.5ML SOAJ SQ injection Inject 0.5 mg into the skin.   [DISCONTINUED] estradiol  (VIVELLE -DOT) 0.0375 MG/24HR Place 1 patch onto the skin two times a week - rotate sites of application   Facility-Administered Encounter Medications as  of 02/18/2024  Medication   albuterol  (PROVENTIL ) (2.5 MG/3ML) 0.083% nebulizer solution 2.5 mg    Allergies (verified) Bee venom and Morphine   History: Past Medical History:  Diagnosis Date   Acute bacterial sinusitis 05/30/2014   AK (actinic keratosis) 02/05/2023   Allergic rhinitis 12/31/2013   Bunion, right 06/08/2017   Constipation    Cough 12/31/2013   COVID-19 07/10/2021   Cystitis  10/12/2015   Depression    Depression, major, single episode, moderate (HCC) 02/12/2019   Family history of skin cancer 02/05/2023   Fatty liver    Foot pain, bilateral 10/30/2012   Glaucoma    Hallux limitus of left foot 06/08/2017   Heart murmur    Heartburn    History of anaphylaxis 12/31/2013   HOT FLASHES 03/02/2010   Qualifier: Diagnosis of   By: Antonio ROSALEA Rockers         Hypothyroidism 08/10/2021   Joint pain    Melanocytic nevi of trunk 02/05/2023   Menopause    Morbid obesity (HCC) 03/09/2018   Starting BMI greater then 30     Nevus of scalp 02/05/2023   NONTOXIC MULTINODULAR GOITER 10/20/2009   Qualifier: Diagnosis of   By: Antonio ROSALEA Rockers         Other specified hypothyroidism 10/20/2009   Qualifier: Diagnosis of   By: Antonio ROSALEA Rockers         Pain in left knee 03/12/2018   Pain in right knee 03/12/2018   Prediabetes    Preventative health care 08/10/2021   Rosacea 02/05/2023   RUQ pain 06/07/2022   Seborrheic keratosis 02/05/2023   Vaginal laceration 09/13/2014   Vitamin D  deficiency    Past Surgical History:  Procedure Laterality Date   COLONOSCOPY  05/30/2010   Dr.Jacobs   foot Left 2019   dr gershon-- [facility] ----  first metarsal replacement L foot    oraf     left eyebrow   ORIF ELBOW FRACTURE     TONSILLECTOMY     TUBAL LIGATION     Family History  Problem Relation Age of Onset   Thyroid  disease Mother    Stroke Mother    Alcohol abuse Mother    Parkinsonism Mother    Hypertension Father    Cancer Father 18       prostate   Hyperlipidemia Father    Raynaud syndrome Sister    Stroke Brother    Colon cancer Neg Hx    Social History   Socioeconomic History   Marital status: Married    Spouse name: Michelle Pratt   Number of children: 3   Years of education: Not on file   Highest education level: Not on file  Occupational History   Occupation: Teacher, 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[employer]    Comment: pre surgical  Tobacco Use   Smoking status:  Former    Current packs/day: 0.00    Average packs/day: 0.8 packs/day for 18.0 years (13.5 ttl pk-yrs)    Types: Cigarettes    Start date: 08/16/1974    Quit date: 08/15/1992    Years since quitting: 31.5   Smokeless tobacco: Never   Tobacco comments:    over 20 years ago  Vaping Use   Vaping status: Never Used  Substance and Sexual Activity   Alcohol use: Yes    Alcohol/week: 5.0 - 7.0 standard drinks of alcohol    Types: 5 - 7 Standard drinks or equivalent per week   Drug  use: No   Sexual activity: Yes    Partners: Male  Other Topics Concern   Not on file  Social History Narrative   Exercise--some bike riding   Caffeine Use: daily 1 cup of coffee   Social Drivers of Corporate investment banker Strain: Low Risk  (02/18/2024)   Overall Financial Resource Strain (CARDIA)    Difficulty of Paying Living Expenses: Not very hard  Food Insecurity: No Food Insecurity (02/18/2024)   Hunger Vital Sign    Worried About Running Out of Food in the Last Year: Never true    Ran Out of Food in the Last Year: Never true  Transportation Needs: No Transportation Needs (02/18/2024)   PRAPARE - Administrator, Civil Service (Medical): No    Lack of Transportation (Non-Medical): No  Physical Activity: Insufficiently Active (02/18/2024)   Exercise Vital Sign    Days of Exercise per Week: 2 days    Minutes of Exercise per Session: 60 min  Stress: Stress Concern Present (02/18/2024)   [employer] of Occupational Health - Occupational Stress Questionnaire    Feeling of Stress: To some extent  Social Connections: Moderately Integrated (02/18/2024)   Social Connection and Isolation Panel    Frequency of Communication with Friends and Family: More than three times a week    Frequency of Social Gatherings with Friends and Family: Once a week    Attends Religious Services: More than 4 times per year    Active Member of [employer] or Organizations: No    Attends Engineer, structural:  Never    Marital Status: Married    Tobacco Counseling Counseling given: Not Answered Tobacco comments: over 20 years ago    Clinical Intake:  Pre-visit preparation completed: Yes  Pain : No/denies pain     BMI - recorded: 29.53 Nutritional Status: BMI 25 -29 Overweight Nutritional Risks: None Diabetes: No  Lab Results  Component Value Date   HGBA1C 5.7 (H) 07/17/2023   HGBA1C 5.7 (H) 02/05/2023   HGBA1C 5.6 07/29/2018     How often do you need to have someone help you when you read instructions, pamphlets, or other written materials from your doctor or pharmacy?: 1 - Never  Interpreter Needed?: No  Information entered by :: Lolita Libra, CMA(AAMA)   Activities of Daily Living     02/18/2024   11:09 AM  In your present state of health, do you have any difficulty performing the following activities:  Hearing? 0  Vision? 0  Difficulty concentrating or making decisions? 0  Walking or climbing stairs? 0  Dressing or bathing? 0  Doing errands, shopping? 0  Preparing Food and eating ? N  Using the Toilet? N  In the past six months, have you accidently leaked urine? N  Do you have problems with loss of bowel control? N  Managing your Medications? N  Managing your Finances? N  Housekeeping or managing your Housekeeping? N    Patient Care Team: Antonio Meth, Jamee SAUNDERS, DO as PCP - General [facility])  I have updated your Care Teams any recent Medical Services you may have received from other providers in the past year.     Assessment:   This is a routine wellness examination for [facility].  Hearing/Vision screen Hearing Screening - Comments:: Denies hearing difficulties.  Vision Screening - Comments:: Due for eye exam with Dr Darnelle   Goals Addressed  This Visit's Progress     Patient Stated (pt-stated)        Pt stated to continue healthy weight and wellness and walk 1-2 days a week and continue water  aerobics twice a week.       Depression Screen     02/18/2024   11:13 AM 02/09/2024   10:09 AM 08/23/2022    1:13 PM 08/23/2022    1:12 PM 05/24/2022    2:57 PM 04/15/2022    9:59 AM 08/10/2021    9:24 AM  PHQ 2/9 Scores  PHQ - 2 Score 2 1 0 0 0 1 0  PHQ- 9 Score 3 1 2   2      Fall Risk     02/18/2024   11:15 AM 02/09/2024   10:09 AM 08/23/2022    1:12 PM 06/07/2022    8:31 AM 05/24/2022    2:57 PM  Fall Risk   Falls in the past year? 0 0 0 0 0  Number falls in past yr: 0 0 0 0 0  Injury with Fall? 0 0 0 0 0  Risk for fall due to : No Fall Risks    No Fall Risks  Follow up Education provided Falls evaluation completed Falls evaluation completed Falls evaluation completed Falls evaluation completed      Data saved with a previous flowsheet row definition    MEDICARE RISK AT HOME:  Medicare Risk at Home Any stairs in or around the home?: Yes If so, are there any without handrails?: No Home free of loose throw rugs in walkways, pet beds, electrical cords, etc?: Yes Adequate lighting in your home to reduce risk of falls?: Yes Life alert?: No Use of a cane, walker or w/c?: No Grab bars in the bathroom?: Yes Shower chair or bench in shower?: Yes Elevated toilet seat or a handicapped toilet?: Yes  TIMED UP AND GO:  Was the test performed?  No,audio  Cognitive Function: 6CIT completed    08/23/2022    5:27 PM  MMSE - Mini Mental State Exam  Orientation to time 5  Orientation to Place 5  Registration 3  Attention/ Calculation 5  Recall 3  Language- name 2 objects 2  Language- repeat 1  Language- follow 3 step command 3  Language- read & follow direction 1  Write a sentence 1  Copy design 1  Total score 30        02/18/2024   11:16 AM  6CIT Screen  What Year? 0 points  What month? 0 points  What time? 0 points  Count back from 20 0 points  Months in reverse 0 points  Repeat phrase 0 points  Total Score 0 points    Immunizations Immunization History   Administered Date(s) Administered   Fluad  Quad(high Dose 65+) 01/11/2022, 01/07/2024   Fluad  Trivalent(High Dose 65+) 03/05/2023   Influenza Whole 02/22/2009   Influenza,inj,Quad PF,6+ Mos 02/05/2021   Influenza-Unspecified 01/12/2015, 01/20/2017   PFIZER(Purple Top)SARS-COV-2 Vaccination 05/12/2019, 05/31/2019, 02/24/2020, 01/07/2024   PNEUMOCOCCAL CONJUGATE-20 04/15/2022   Td 10/26/2008   Tdap 09/05/2014   Zoster Recombinant(Shingrix) 02/06/2021, 08/10/2021    Screening Tests Health Maintenance  Topic Date Due   Medicare Annual Wellness (AWV)  08/23/2023   Mammogram  09/09/2023   COVID-19 Vaccine (5 - 2025-26 season) 03/03/2024   DTaP/Tdap/Td (3 - Td or Tdap) 09/04/2024   Colonoscopy  04/11/2031   Pneumococcal Vaccine: 50+ Years  Completed   Influenza Vaccine  Completed   DEXA SCAN  Completed   Hepatitis C Screening  Completed   Zoster Vaccines- Shingrix  Completed   Meningococcal B Vaccine  Aged Out    Health Maintenance Items Addressed: Mammogram ordered. All other HM up to date.  Additional Screening:  Vision Screening: Recommended annual ophthalmology exams for early detection of glaucoma and other disorders of the eye. Is the patient up to date with their annual eye exam?  No  Who is the provider or what is the name of the office in which the patient attends annual eye exams? Dr Darnelle.  Dental Screening: Recommended annual dental exams for proper oral hygiene  Community Resource Referral / Chronic Care Management: CRR required this visit?  No   CCM required this visit?  No   Plan:    I have personally reviewed and noted the following in the patient's chart:   Medical and social history Use of alcohol, tobacco or illicit drugs  Current medications and supplements including opioid prescriptions. Patient is not currently taking opioid prescriptions. Functional ability and status Nutritional status Physical activity Advanced directives List of other  physicians Hospitalizations, surgeries, and ER visits in previous 12 months Vitals Screenings to include cognitive, depression, and falls Referrals and appointments  In addition, I have reviewed and discussed with patient certain preventive protocols, quality metrics, and best practice recommendations. A written personalized care plan for preventive services as well as general preventive health recommendations were provided to patient.   Lolita Libra, CMA   02/18/2024   After Visit Summary: (MyChart) Due to this being a telephonic visit, the after visit summary with patients personalized plan was offered to patient via MyChart   Notes: Nothing significant to report at this time.

## 2024-02-18 NOTE — Patient Instructions (Addendum)
 Michelle Pratt , Thank you for taking time out of your busy schedule to complete your Annual Wellness Visit with me. I enjoyed our conversation and look forward to speaking with you again next year. I, as well as your care team,  appreciate your ongoing commitment to your health goals. Please review the following plan we discussed and let me know if I can assist you in the future. Your Game plan/ To Do List    Congratulations on your lifestyle changes and weight loss!  Goals:  Pt stated to continue healthy weight and wellness and walk 1-2 days a week and continue water aerobics twice a week.  Referrals: If you haven't heard from the office you've been referred to, please reach out to them at the phone provided.   Mammogram (due now) [facility]: [phone]  Bone Density (due 09/09/24) [facility]:  [phone]  Follow up Visits: Next Medicare AWV with our clinical staff: 02/23/25 9am, AWV telephone.    Next Office Visit with your provider: 08/03/24 8:20am, fasting. Dr Antonio Meth.  Clinician Recommendations:  Aim for 30 minutes of exercise or brisk walking, 6-8 glasses of water, and 5 servings of fruits and vegetables each day.       This is a list of the screening recommended for you and due dates:  Health Maintenance  Topic Date Due   Breast Cancer Screening  09/09/2023   COVID-19 Vaccine (5 - 2025-26 season) 03/03/2024   DTaP/Tdap/Td vaccine (3 - Td or Tdap) 09/04/2024   Medicare Annual Wellness Visit  02/17/2025   Colon Cancer Screening  04/11/2031   Pneumococcal Vaccine for age over 56  Completed   Flu Shot  Completed   DEXA scan (bone density measurement)  Completed   Hepatitis C Screening  Completed   Zoster (Shingles) Vaccine  Completed   Meningitis B Vaccine  Aged Out    Advanced directives: (Copy Requested) Please bring a copy of your health care power of attorney and living will to the office to be added to your chart at your convenience. You can mail  to [facility] [address] [place], [place] [address] or email to [email or web address] Advance Care Planning is important because it:  [x]  Makes sure you receive the medical care that is consistent with your values, goals, and preferences  [x]  It provides guidance to your family and loved ones and reduces their decisional burden about whether or not they are making the right decisions based on your wishes.  Follow the link provided in your after visit summary or read over the paperwork we have mailed to you to help you started getting your Advance Directives in place. If you need assistance in completing these, please reach out to us  so that we can help you!  See attachments for Preventive Care and Fall Prevention Tips.

## 2024-02-24 ENCOUNTER — Other Ambulatory Visit (HOSPITAL_COMMUNITY): Payer: Self-pay

## 2024-02-25 ENCOUNTER — Other Ambulatory Visit (HOSPITAL_COMMUNITY): Payer: Self-pay

## 2024-02-25 MED ORDER — BUPROPION HCL ER (XL) 300 MG PO TB24
ORAL_TABLET | ORAL | 1 refills | Status: DC
  Filled 2024-02-25: qty 30, 30d supply, fill #0
  Filled 2024-03-29: qty 30, 30d supply, fill #1

## 2024-03-02 ENCOUNTER — Telehealth (HOSPITAL_BASED_OUTPATIENT_CLINIC_OR_DEPARTMENT_OTHER): Payer: Self-pay

## 2024-03-08 ENCOUNTER — Other Ambulatory Visit (HOSPITAL_COMMUNITY): Payer: Self-pay

## 2024-03-08 MED ORDER — ESTRADIOL 0.05 MG/24HR TD PTTW
1.0000 | MEDICATED_PATCH | TRANSDERMAL | 0 refills | Status: DC
  Filled 2024-03-08: qty 24, 84d supply, fill #0

## 2024-03-29 ENCOUNTER — Other Ambulatory Visit: Payer: Self-pay

## 2024-03-29 ENCOUNTER — Other Ambulatory Visit (HOSPITAL_COMMUNITY): Payer: Self-pay

## 2024-03-29 ENCOUNTER — Other Ambulatory Visit: Payer: Self-pay | Admitting: Family Medicine

## 2024-03-29 DIAGNOSIS — E039 Hypothyroidism, unspecified: Secondary | ICD-10-CM

## 2024-03-29 MED ORDER — LEVOTHYROXINE SODIUM 125 MCG PO TABS
125.0000 ug | ORAL_TABLET | Freq: Every day | ORAL | 1 refills | Status: AC
  Filled 2024-03-29: qty 90, 90d supply, fill #0
  Filled 2024-04-06: qty 90, 90d supply, fill #1

## 2024-04-06 ENCOUNTER — Other Ambulatory Visit (HOSPITAL_COMMUNITY): Payer: Self-pay

## 2024-04-06 MED ORDER — PROGESTERONE MICRONIZED 100 MG PO CAPS
100.0000 mg | ORAL_CAPSULE | Freq: Every day | ORAL | 1 refills | Status: DC
  Filled 2024-04-06: qty 180, 90d supply, fill #0
  Filled 2024-04-12: qty 16, 8d supply, fill #0
  Filled 2024-05-03 – 2024-05-10 (×2): qty 16, 8d supply, fill #1
  Filled 2024-05-18: qty 16, 8d supply, fill #2
  Filled 2024-05-24: qty 180, 90d supply, fill #3
  Filled 2024-05-24: qty 16, 8d supply, fill #3

## 2024-04-07 ENCOUNTER — Other Ambulatory Visit (HOSPITAL_COMMUNITY): Payer: Self-pay

## 2024-04-12 ENCOUNTER — Other Ambulatory Visit (HOSPITAL_COMMUNITY): Payer: Self-pay

## 2024-04-26 ENCOUNTER — Other Ambulatory Visit (HOSPITAL_COMMUNITY): Payer: Self-pay

## 2024-04-27 ENCOUNTER — Other Ambulatory Visit (HOSPITAL_COMMUNITY): Payer: Self-pay

## 2024-04-27 MED ORDER — BUPROPION HCL ER (XL) 300 MG PO TB24
300.0000 mg | ORAL_TABLET | Freq: Every morning | ORAL | 1 refills | Status: DC
  Filled 2024-04-27: qty 30, 30d supply, fill #0
  Filled 2024-05-25: qty 30, 30d supply, fill #1

## 2024-05-04 ENCOUNTER — Other Ambulatory Visit (HOSPITAL_COMMUNITY): Payer: Self-pay

## 2024-05-10 ENCOUNTER — Other Ambulatory Visit (HOSPITAL_COMMUNITY): Payer: Self-pay

## 2024-05-24 ENCOUNTER — Other Ambulatory Visit (HOSPITAL_COMMUNITY): Payer: Self-pay

## 2024-05-24 ENCOUNTER — Ambulatory Visit: Admitting: Family Medicine

## 2024-05-24 ENCOUNTER — Encounter: Payer: Self-pay | Admitting: Family Medicine

## 2024-05-24 VITALS — BP 120/80 | HR 69 | Temp 98.1°F | Resp 18 | Ht 69.0 in | Wt 201.8 lb

## 2024-05-24 DIAGNOSIS — J0140 Acute pansinusitis, unspecified: Secondary | ICD-10-CM

## 2024-05-24 DIAGNOSIS — R051 Acute cough: Secondary | ICD-10-CM

## 2024-05-24 MED ORDER — FLUTICASONE PROPIONATE 50 MCG/ACT NA SUSP
2.0000 | Freq: Every day | NASAL | 6 refills | Status: AC
  Filled 2024-05-24: qty 16, 30d supply, fill #0

## 2024-05-24 MED ORDER — CEFDINIR 300 MG PO CAPS
300.0000 mg | ORAL_CAPSULE | Freq: Two times a day (BID) | ORAL | 0 refills | Status: AC
  Filled 2024-05-24: qty 20, 10d supply, fill #0

## 2024-05-24 MED ORDER — PROMETHAZINE-DM 6.25-15 MG/5ML PO SYRP
5.0000 mL | ORAL_SOLUTION | Freq: Four times a day (QID) | ORAL | 0 refills | Status: AC | PRN
  Filled 2024-05-24: qty 118, 6d supply, fill #0

## 2024-05-24 NOTE — Progress Notes (Signed)
 "  Subjective:    Patient ID: Michelle Pratt, female    DOB: December 04, 1955, 69 y.o.   MRN: [id number]  Chief Complaint  Patient presents with   Cough    Sxs started at the start of Jan. No testing at home. Pt states using tylenol sinus and aleve and left over cough medicine.     HPI Patient is in today for c/o congestion and sinus pressure.   Discussed the use of AI scribe software for clinical note transcription with the patient, who gave verbal consent to proceed.  History of Present Illness Michelle Pratt is a 69 year old female who presents with prolonged cold and congestion symptoms.  She has been experiencing cold and congestion symptoms since May 14, 2024, beginning with a scratchy throat. The symptoms have persisted for about ten days, characterized by a productive cough with mucus, sinus pressure, and intermittent ear popping. No fevers, but she mentions some body aches. She reported wheezing a few days ago but has not noticed it recently.  She has been managing her symptoms with over-the-counter medications, including promethazine  DM at night, Tylenol sinus, and Aleve for pressure, which has now subsided. Mucinex was added midway through her symptoms. She has not used any nasal sprays like Flonase .  A COVID test taken before Christmas was negative, and she has not taken another test since. She attended a large party during Christmas where some attendees contracted the flu, but she does not believe she had the flu due to the absence of severe symptoms like high fever.     Past Medical History:  Diagnosis Date   Acute bacterial sinusitis 05/30/2014   AK (actinic keratosis) 02/05/2023   Allergic rhinitis 12/31/2013   Bunion, right 06/08/2017   Constipation    Cough 12/31/2013   COVID-19 07/10/2021   Cystitis 10/12/2015   Depression    Depression, major, single episode, moderate (HCC) 02/12/2019   Family history of skin cancer 02/05/2023   Fatty liver    Foot pain,  bilateral 10/30/2012   Glaucoma    Hallux limitus of left foot 06/08/2017   Heart murmur    Heartburn    History of anaphylaxis 12/31/2013   HOT FLASHES 03/02/2010   Qualifier: Diagnosis of   By: Antonio ROSALEA Rockers         Hypothyroidism 08/10/2021   Joint pain    Melanocytic nevi of trunk 02/05/2023   Menopause    Morbid obesity (HCC) 03/09/2018   Starting BMI greater then 30     Nevus of scalp 02/05/2023   NONTOXIC MULTINODULAR GOITER 10/20/2009   Qualifier: Diagnosis of   By: Antonio ROSALEA Rockers         Other specified hypothyroidism 10/20/2009   Qualifier: Diagnosis of   By: Antonio ROSALEA Rockers         Pain in left knee 03/12/2018   Pain in right knee 03/12/2018   Prediabetes    Preventative health care 08/10/2021   Rosacea 02/05/2023   RUQ pain 06/07/2022   Seborrheic keratosis 02/05/2023   Vaginal laceration 09/13/2014   Vitamin D  deficiency     Past Surgical History:  Procedure Laterality Date   COLONOSCOPY  05/30/2010   Dr.Jacobs   foot Left 2019   dr gershon-- [facility] ----  first metarsal replacement L foot    oraf     left eyebrow   ORIF ELBOW FRACTURE     TONSILLECTOMY     TUBAL LIGATION  Family History  Problem Relation Age of Onset   Thyroid  disease Mother    Stroke Mother    Alcohol abuse Mother    Parkinsonism Mother    Hypertension Father    Cancer Father 44       prostate   Hyperlipidemia Father    Raynaud syndrome Sister    Stroke Brother    Colon cancer Neg Hx     Social History   Socioeconomic History   Marital status: Married    Spouse name: Gordy   Number of children: 3   Years of education: Not on file   Highest education level: Not on file  Occupational History   Occupation: Teacher, Adult Education: [employer]    Comment: pre surgical  Tobacco Use   Smoking status: Former    Current packs/day: 0.00    Average packs/day: 0.8 packs/day for 18.0 years (13.5 ttl pk-yrs)    Types: Cigarettes    Start date: 08/16/1974     Quit date: 08/15/1992    Years since quitting: 31.7   Smokeless tobacco: Never   Tobacco comments:    over 20 years ago  Vaping Use   Vaping status: Never Used  Substance and Sexual Activity   Alcohol use: Yes    Alcohol/week: 5.0 - 7.0 standard drinks of alcohol    Types: 5 - 7 Standard drinks or equivalent per week   Drug use: No   Sexual activity: Yes    Partners: Male  Other Topics Concern   Not on file  Social History Narrative   Exercise--some bike riding   Caffeine Use: daily 1 cup of coffee   Social Drivers of Health   Tobacco Use: Medium Risk (05/24/2024)   Patient History    Smoking Tobacco Use: Former    Smokeless Tobacco Use: Never    Passive Exposure: Not on file  Financial Resource Strain: Low Risk (02/18/2024)   Overall Financial Resource Strain (CARDIA)    Difficulty of Paying Living Expenses: Not very hard  Food Insecurity: No Food Insecurity (02/18/2024)   Epic    Worried About Radiation Protection Practitioner of Food in the Last Year: Never true    Ran Out of Food in the Last Year: Never true  Transportation Needs: No Transportation Needs (02/18/2024)   Epic    Lack of Transportation (Medical): No    Lack of Transportation (Non-Medical): No  Physical Activity: Insufficiently Active (02/18/2024)   Exercise Vital Sign    Days of Exercise per Week: 2 days    Minutes of Exercise per Session: 60 min  Stress: Stress Concern Present (02/18/2024)   [employer] of Occupational Health - Occupational Stress Questionnaire    Feeling of Stress: To some extent  Social Connections: Moderately Integrated (02/18/2024)   Social Connection and Isolation Panel    Frequency of Communication with Friends and Family: More than three times a week    Frequency of Social Gatherings with Friends and Family: Once a week    Attends Religious Services: More than 4 times per year    Active Member of [employer] or Organizations: No    Attends Banker Meetings: Never    Marital Status: Married   Catering Manager Violence: Not At Risk (02/18/2024)   Epic    Fear of Current or Ex-Partner: No    Emotionally Abused: No    Physically Abused: No    Sexually Abused: No  Depression (PHQ2-9): Low Risk (02/18/2024)   Depression (PHQ2-9)  PHQ-2 Score: 3  Alcohol Screen: Low Risk (02/18/2024)   Alcohol Screen    Last Alcohol Screening Score (AUDIT): 4  Housing: Low Risk (02/18/2024)   Epic    Unable to Pay for Housing in the Last Year: No    Number of Times Moved in the Last Year: 0    Homeless in the Last Year: No  Utilities: Not At Risk (02/18/2024)   Epic    Threatened with loss of utilities: No  Health Literacy: Adequate Health Literacy (02/18/2024)   B1300 Health Literacy    Frequency of need for help with medical instructions: Never    Outpatient Medications Prior to Visit  Medication Sig Dispense Refill   acetaminophen (TYLENOL) 325 MG tablet Take 650 mg by mouth every 6 (six) hours as needed for mild pain or moderate pain.     buPROPion  (WELLBUTRIN  XL) 300 MG 24 hr tablet Take 1 tablet (300 mg total) by mouth every morning. 30 tablet 1   cetirizine  (ZYRTEC ) 10 MG tablet Take 10 mg by mouth as needed for allergies or rhinitis.     Cholecalciferol (VITAMIN D3) 125 MCG (5000 UT) CAPS Take 1 capsule (5,000 Units total) by mouth daily.     EPINEPHrine  0.3 mg/0.3 mL IJ SOAJ injection Inject 0.3 mg into the muscle once as needed (allergic reaction). 2 each 0   estradiol  (VIVELLE -DOT) 0.025 MG/24HR Place 1 patch onto the skin 2 (two) times a week.     FLUoxetine  (PROZAC ) 20 MG capsule Take 1 capsule (20 mg total) by mouth daily. Needs appt 90 capsule 3   levothyroxine  (SYNTHROID ) 125 MCG tablet Take 1 tablet (125 mcg total) by mouth daily before breakfast. 90 tablet 1   Multiple Vitamin (MULTI VITAMIN PO) Take 1 tablet by mouth daily.     progesterone  (PROMETRIUM ) 100 MG capsule Take 1-2 capsules (100-200 mg total) by mouth at bedtime. 180 capsule 1   semaglutide -weight management  (WEGOVY ) 0.25 MG/0.5ML SOAJ SQ injection Inject 0.5 mg into the skin.     estradiol  (VIVELLE -DOT) 0.0375 MG/24HR Place 1 patch onto the skin two times a week - rotate sites of application 8 patch 1   estradiol  (VIVELLE -DOT) 0.05 MG/24HR patch Place 1 patch onto the skin two times a week - rotate sites of application 24 patch 0   Melatonin 3 MG CAPS Take 3 mg by mouth as needed (sleep).     Facility-Administered Medications Prior to Visit  Medication Dose Route Frequency Provider Last Rate Last Admin   albuterol  (PROVENTIL ) (2.5 MG/3ML) 0.083% nebulizer solution 2.5 mg  2.5 mg Nebulization Once Jason Leita Repine, FNP        Allergies  Allergen Reactions   Bee Venom Anaphylaxis and Swelling   Morphine Nausea And Vomiting    Review of Systems  Constitutional:  Negative for fever and malaise/fatigue.  HENT:  Positive for congestion, ear pain and sinus pain. Negative for sore throat.   Eyes:  Negative for blurred vision.  Respiratory:  Positive for cough, sputum production and wheezing. Negative for shortness of breath.   Cardiovascular:  Negative for chest pain, palpitations and leg swelling.  Gastrointestinal:  Negative for abdominal pain, blood in stool and nausea.  Genitourinary:  Negative for dysuria and frequency.  Musculoskeletal:  Negative for falls.  Skin:  Negative for rash.  Neurological:  Negative for dizziness, loss of consciousness and headaches.  Endo/Heme/Allergies:  Negative for environmental allergies.  Psychiatric/Behavioral:  Negative for depression. The patient is not nervous/anxious.  Objective:    Physical Exam Vitals and nursing note reviewed.  Constitutional:      General: She is not in acute distress.    Appearance: Normal appearance. She is well-developed.  HENT:     Head: Normocephalic and atraumatic.     Nose:     Right Sinus: Maxillary sinus tenderness and frontal sinus tenderness present.     Left Sinus: Maxillary sinus tenderness and  frontal sinus tenderness present.  Eyes:     General: No scleral icterus.       Right eye: No discharge.        Left eye: No discharge.  Cardiovascular:     Rate and Rhythm: Normal rate and regular rhythm.     Heart sounds: No murmur heard. Pulmonary:     Effort: Pulmonary effort is normal. No respiratory distress.     Breath sounds: Normal breath sounds.  Musculoskeletal:        General: Normal range of motion.     Cervical back: Normal range of motion and neck supple.     Right lower leg: No edema.     Left lower leg: No edema.  Skin:    General: Skin is warm and dry.  Neurological:     Mental Status: She is alert and oriented to person, place, and time.  Psychiatric:        Mood and Affect: Mood normal.        Behavior: Behavior normal.        Thought Content: Thought content normal.        Judgment: Judgment normal.     BP 120/80 (BP Location: Left Arm, Patient Position: Sitting, Cuff Size: Normal)   Pulse 69   Temp 98.1 F (36.7 C) (Oral)   Resp 18   Ht 5' 9 (1.753 m)   Wt 201 lb 12.8 oz (91.5 kg)   SpO2 97%   BMI 29.80 kg/m  Wt Readings from Last 3 Encounters:  05/24/24 201 lb 12.8 oz (91.5 kg)  02/18/24 200 lb (90.7 kg)  02/09/24 200 lb 3.2 oz (90.8 kg)    Diabetic Foot Exam - Simple   No data filed    Lab Results  Component Value Date   WBC 4.6 02/05/2023   HGB 14.6 02/05/2023   HCT 45.0 02/05/2023   PLT 248 02/05/2023   GLUCOSE 108 (H) 07/17/2023   CHOL 243 (H) 07/17/2023   TRIG 133 07/17/2023   HDL 67 07/17/2023   LDLDIRECT 133.3 03/11/2012   LDLCALC 153 (H) 07/17/2023   ALT 34 (H) 07/17/2023   AST 20 07/17/2023   NA 143 07/17/2023   K 4.7 07/17/2023   CL 102 07/17/2023   CREATININE 0.94 07/17/2023   BUN 22 07/17/2023   CO2 24 07/17/2023   TSH 1.360 02/05/2023   HGBA1C 5.7 (H) 07/17/2023    Lab Results  Component Value Date   TSH 1.360 02/05/2023   Lab Results  Component Value Date   WBC 4.6 02/05/2023   HGB 14.6 02/05/2023    HCT 45.0 02/05/2023   MCV 95 02/05/2023   PLT 248 02/05/2023   Lab Results  Component Value Date   NA 143 07/17/2023   K 4.7 07/17/2023   CO2 24 07/17/2023   GLUCOSE 108 (H) 07/17/2023   BUN 22 07/17/2023   CREATININE 0.94 07/17/2023   BILITOT 0.4 07/17/2023   ALKPHOS 73 07/17/2023   AST 20 07/17/2023   ALT 34 (H) 07/17/2023   PROT 6.5 07/17/2023  ALBUMIN 4.6 07/17/2023   CALCIUM 9.9 07/17/2023   EGFR 57.0 10/23/2023   GFR 81.88 08/23/2022   Lab Results  Component Value Date   CHOL 243 (H) 07/17/2023   Lab Results  Component Value Date   HDL 67 07/17/2023   Lab Results  Component Value Date   LDLCALC 153 (H) 07/17/2023   Lab Results  Component Value Date   TRIG 133 07/17/2023   Lab Results  Component Value Date   CHOLHDL 3 08/23/2022   Lab Results  Component Value Date   HGBA1C 5.7 (H) 07/17/2023       Assessment & Plan:  Acute non-recurrent pansinusitis -     Cefdinir ; Take 1 capsule (300 mg total) by mouth 2 (two) times daily.  Dispense: 20 capsule; Refill: 0 -     Fluticasone  Propionate; Place 2 sprays into both nostrils daily.  Dispense: 16 g; Refill: 6  Acute cough -     Promethazine -DM; Take 5 mLs by mouth 4 (four) times daily as needed.  Dispense: 118 mL; Refill: 0  Assessment and Plan Assessment & Plan Acute pansinusitis   She has experienced persistent cold and congestion since January 2nd, with productive cough, sinus pressure, and ear popping, but no fever. An initial COVID test was negative. Symptoms are suggestive of acute pansinusitis. There is no current wheezing. She previously used promethazine  DM and Tylenol sinus but not nasal spray. Prescribe Augmentin  for potential bacterial infection and Flonase  nasal spray to alleviate sinus pressure. Recommend using an antihistamine like Claritin or DayQuil with Flonase . Discussed that Omnicef  may cause pink or red stools, which is not indicative of blood. Advise calling before the weekend if  symptoms do not improve for a potential prednisone  prescription.    Genesys Coggeshall R Lowne Chase, DO "

## 2024-06-09 ENCOUNTER — Encounter: Payer: Self-pay | Admitting: Family Medicine

## 2024-06-10 ENCOUNTER — Other Ambulatory Visit: Payer: Self-pay

## 2024-06-10 ENCOUNTER — Other Ambulatory Visit: Payer: Self-pay | Admitting: Family Medicine

## 2024-06-10 ENCOUNTER — Telehealth (HOSPITAL_COMMUNITY): Payer: Self-pay

## 2024-06-10 ENCOUNTER — Other Ambulatory Visit (HOSPITAL_COMMUNITY): Payer: Self-pay

## 2024-06-10 DIAGNOSIS — Z780 Asymptomatic menopausal state: Secondary | ICD-10-CM

## 2024-06-10 DIAGNOSIS — F418 Other specified anxiety disorders: Secondary | ICD-10-CM

## 2024-06-10 MED ORDER — ESTRADIOL 0.025 MG/24HR TD PTTW
1.0000 | MEDICATED_PATCH | TRANSDERMAL | 5 refills | Status: AC
  Filled 2024-06-10: qty 8, 28d supply, fill #0

## 2024-06-10 MED ORDER — PROGESTERONE MICRONIZED 100 MG PO CAPS
100.0000 mg | ORAL_CAPSULE | Freq: Every day | ORAL | 1 refills | Status: AC
  Filled 2024-06-10: qty 180, 90d supply, fill #0

## 2024-06-10 MED ORDER — SEMAGLUTIDE-WEIGHT MANAGEMENT 0.25 MG/0.5ML ~~LOC~~ SOAJ
0.5000 mg | SUBCUTANEOUS | 3 refills | Status: DC
  Filled 2024-06-10: qty 2, 28d supply, fill #0

## 2024-06-10 MED ORDER — BUPROPION HCL ER (XL) 300 MG PO TB24
300.0000 mg | ORAL_TABLET | Freq: Every morning | ORAL | 1 refills | Status: AC
  Filled 2024-06-10: qty 30, 30d supply, fill #0

## 2024-06-10 NOTE — Telephone Encounter (Signed)
 Pharmacy Patient Advocate Encounter   Received notification from Pt Calls Messages that prior authorization for Wegovy  0.25 mg/0.5 ml auto injectors is required/requested.   Insurance verification completed.   The patient is insured through [facility] ADVANTAGE/RX ADVANCE.   Per test claim: Per test claim, medication is not covered due to plan/benefit exclusion, PA not submitted at this time

## 2024-06-10 NOTE — Telephone Encounter (Signed)
 PA request has been Received. New Encounter has been or will be created for follow up. For additional info see Pharmacy Prior Auth telephone encounter from 06/10/24.

## 2024-06-11 NOTE — Telephone Encounter (Signed)
 Mychart sent to patient.

## 2024-06-16 DIAGNOSIS — Z0289 Encounter for other administrative examinations: Secondary | ICD-10-CM

## 2024-06-17 ENCOUNTER — Other Ambulatory Visit (INDEPENDENT_AMBULATORY_CARE_PROVIDER_SITE_OTHER): Payer: Self-pay | Admitting: Nurse Practitioner

## 2024-06-17 ENCOUNTER — Ambulatory Visit (INDEPENDENT_AMBULATORY_CARE_PROVIDER_SITE_OTHER): Admitting: Nurse Practitioner

## 2024-06-17 ENCOUNTER — Encounter (INDEPENDENT_AMBULATORY_CARE_PROVIDER_SITE_OTHER): Payer: Self-pay | Admitting: Nurse Practitioner

## 2024-06-17 VITALS — BP 105/66 | HR 64 | Temp 98.2°F | Ht 69.0 in | Wt 197.0 lb

## 2024-06-17 DIAGNOSIS — E7800 Pure hypercholesterolemia, unspecified: Secondary | ICD-10-CM

## 2024-06-17 DIAGNOSIS — E663 Overweight: Secondary | ICD-10-CM | POA: Diagnosis not present

## 2024-06-17 DIAGNOSIS — E559 Vitamin D deficiency, unspecified: Secondary | ICD-10-CM | POA: Diagnosis not present

## 2024-06-17 DIAGNOSIS — Z6829 Body mass index (BMI) 29.0-29.9, adult: Secondary | ICD-10-CM

## 2024-06-17 DIAGNOSIS — R29818 Other symptoms and signs involving the nervous system: Secondary | ICD-10-CM

## 2024-06-17 DIAGNOSIS — K760 Fatty (change of) liver, not elsewhere classified: Secondary | ICD-10-CM

## 2024-06-17 DIAGNOSIS — E669 Obesity, unspecified: Secondary | ICD-10-CM

## 2024-06-17 DIAGNOSIS — E039 Hypothyroidism, unspecified: Secondary | ICD-10-CM | POA: Diagnosis not present

## 2024-06-17 MED ORDER — OZEMPIC (0.25 OR 0.5 MG/DOSE) 2 MG/3ML ~~LOC~~ SOPN: 0.5000 mg | PEN_INJECTOR | SUBCUTANEOUS | 0 refills | Status: AC

## 2024-06-17 NOTE — Progress Notes (Signed)
 " Office: [phone]  /  Fax: [phone]  WEIGHT SUMMARY AND BIOMETRICS  Weight Lost Since Last Visit: 11lb  Weight Gained Since Last Visit: 0lb   Vitals Temp: 98.2 F (36.8 C) BP: 105/66 Pulse Rate: 64 SpO2: 97 %   Anthropometric Measurements Height: 5' 9 (1.753 m) Weight: 197 lb (89.4 kg) BMI (Calculated): 29.08 Weight at Last Visit: 208lb Weight Lost Since Last Visit: 11lb Weight Gained Since Last Visit: 0lb Starting Weight: 214lb Total Weight Loss (lbs): 17 lb (7.711 kg)   Body Composition  Body Fat %: 40.9 % Fat Mass (lbs): 80.8 lbs Muscle Mass (lbs): 110.8 lbs Total Body Water (lbs): 73.4 lbs Visceral Fat Rating : 11   Other Clinical Data RMR: 1627 Fasting: Yes Labs: Yes Today's Visit #: 10 Starting Date: 02/05/23    Total Weight Loss: 14 pounds Percent of body weight lost: 6.5%   Bio Impedance Data reviewed with patient: Muscle is down 3.8 pounds, adipose is down 7.2 pounds. Visceral fat rating is down 1 point from 12 to 11.  HPI  Chief Complaint: OBESITY  Michelle Pratt is here to discuss her progress with her obesity treatment plan. She is on the the Category 2 Plan and states she is following her eating plan approximately 85 % of the time. She states she is exercising 60 minutes 2 days per week.   Interval History:  [name] presents today to reestablish care.   Her last visit was 09/18/23. Her previous RMR was 1858 and today is 1627 with VO2 of 237. She had been following with [facility] and was on compounded semaglutide  possibly 0.5 mg/week). She is getting 60-100 grams of protein daily.  She will occasionally skip a meal but not intentionally- day will go past and she will not have eaten breakfast or lunch. She is doing water aerobics 2 times a week at [employer].   She get 5- hours of sleep a night.  She does snore and her husband has noted some apnea episodes. She has trouble falling asleep, nighttime awakenings and difficulty falling  back asleep.  She does have hypothyroidism and is currently on Levothyroxine  125 mcg every day Lab Results  Component Value Date   TSH 1.360 02/05/2023   She does have a history of Vit D deficiency which is managed with cholecalciferol 5000 units daily Last vitamin D  Lab Results  Component Value Date   VD25OH 53.5 07/17/2023   She does have a history of hepatic steatosis noted on 06/07/22 U/S and mildly elevated LFT Last metabolic panel Lab Results  Component Value Date   GLUCOSE 108 (H) 07/17/2023   NA 143 07/17/2023   K 4.7 07/17/2023   CL 102 07/17/2023   CO2 24 07/17/2023   BUN 22 07/17/2023   CREATININE 0.94 07/17/2023   EGFR 57.0 10/23/2023   CALCIUM 9.9 07/17/2023   PROT 6.5 07/17/2023   ALBUMIN 4.6 07/17/2023   LABGLOB 1.9 07/17/2023   AGRATIO 3.0 (H) 07/29/2018   BILITOT 0.4 07/17/2023   ALKPHOS 73 07/17/2023   AST 20 07/17/2023   ALT 34 (H) 07/17/2023   She has a history of pure hypercholesterolemia and is working on nutrition and weight loss to lower lipid levels Lab Results  Component Value Date   CHOL 243 (H) 07/17/2023   HDL 67 07/17/2023   LDLCALC 153 (H) 07/17/2023   LDLDIRECT 133.3 03/11/2012   TRIG 133 07/17/2023   CHOLHDL 3 08/23/2022     PHYSICAL EXAM:  Blood pressure 105/66, pulse  64, temperature 98.2 F (36.8 C), height 5' 9 (1.753 m), weight 197 lb (89.4 kg), SpO2 97%. Body mass index is 29.09 kg/m.  General: Well Developed, well nourished, and in no acute distress.  HEENT: Normocephalic, atraumatic; EOMI, sclerae are anicteric. Skin: Warm and dry, good turgor Chest:  Normal excursion, shape, no gross ABN Respiratory: No conversational dyspnea; speaking in full sentences NeuroM-Sk:  Normal gross ROM * 4 extremities  Psych: A and O X 3, insight adequate, mood- full        ASSESSMENT AND PLAN Overweight with body mass index (BMI) of 29 to 29.9 in adult TREATMENT PLAN FOR OBESITY:  Recommended Dietary Goals  [name] is  currently in the action stage of change. As such, her goal is to continue weight management plan. She has agreed to the Category 2 Plan.  Behavioral Intervention  We discussed the following Behavioral Modification Strategies today: increasing lean protein intake to established goals, avoiding skipping meals, increasing water intake , better snacking choices, continue to work on maintaining a reduced calorie state, getting the recommended amount of protein, incorporating whole foods, making healthy choices, staying well hydrated and practicing mindfulness when eating., and increase protein intake, fibrous foods (25 grams per day for women, 30 grams for men) and water to improve satiety and decrease hunger signals. .    Recommended Physical Activity Goals  [name] has been advised to work up to 150 minutes of moderate intensity aerobic activity a week and strengthening exercises 2-3 times per week for cardiovascular health, weight loss maintenance and preservation of muscle mass.   She has agreed to Start aerobic activity with a goal of 150 minutes a week at moderate intensity.    Pharmacotherapy We discussed various medication options to help [facility] with her weight loss efforts and we both agreed to continue Semaglutide  0.5 mg SQ QW- denies side effect from medication- has been previously getting prescribed through [facility]- she is willing to pay out of pocket  ASSOCIATED CONDITIONS ADDRESSED TODAY  Action/Plan  Hypothyroidism, unspecified type Continue Levothyroxine  125 mcg every day and follow regularly with PCP Recheck TSH today -     TSH  Suspected sleep apnea Refer for sleep study Continue to work on nutrition, exercise and weight loss -     Ambulatory referral to Sleep Studies  Overweight with body mass index (BMI) of 29 to 29.9 in adult See plan above -     Ozempic  (0.25 or 0.5 MG/DOSE); Inject 0.5 mg into the skin once a week.  Dispense: 3 mL; Refill: 0 -     CBC with  Differential/Platelet -     Comprehensive metabolic panel with GFR -     Hemoglobin A1c -     Insulin , random -     Lipid panel -     Magnesium -     TSH -     Vitamin B12 -     VITAMIN D  25 Hydroxy (Vit-D Deficiency, Fractures)  Vitamin D  deficiency Low vitamin D  levels can be associated with adiposity and may result in leptin resistance and weight gain. Also associated with fatigue.  Currently on vitamin D  supplementation without any adverse effects such as nausea, vomiting or muscle weakness.   -     VITAMIN D  25 Hydroxy (Vit-D Deficiency, Fractures)  Metabolic dysfunction-associated steatotic liver disease (MASLD) steatosis noted on 05/2022 U/S Start Category 2 meal plan and focus on limiting saturated fats and simple carbohydrates Loss of 10-15% of body weight can help  improve hepatic steatosis   Pure hypercholesterolemia Focus on implementing category 2 meal plan, limit saturated fats. Increase lean protein, fiber and water Continue to follow regularly with PCP Focus on getting 150 minutes a week of moderate to high intensity exercise         Return in about 4 weeks (around 07/15/2024).SABRA She was informed of the importance of frequent follow up visits to maximize her success with intensive lifestyle modifications for her multiple health conditions.   ATTESTASTION STATEMENTS:  Reviewed by clinician on day of visit: allergies, medications, problem list, medical history, surgical history, family history, social history, and previous encounter notes.   I personally spent a total of 47 minutes in the care of the patient today including preparing to see the patient, getting/reviewing separately obtained history, performing a medically appropriate exam/evaluation, counseling and educating, placing orders, and documenting clinical information in the EHR.   Lonell Liverpool ANP-C "

## 2024-06-18 LAB — CBC WITH DIFFERENTIAL/PLATELET
Basophils Absolute: 0 10*3/uL (ref 0.0–0.2)
Basos: 1 %
EOS (ABSOLUTE): 0.1 10*3/uL (ref 0.0–0.4)
Eos: 3 %
Hematocrit: 42.7 % (ref 34.0–46.6)
Hemoglobin: 14.5 g/dL (ref 11.1–15.9)
Immature Grans (Abs): 0 10*3/uL (ref 0.0–0.1)
Immature Granulocytes: 0 %
Lymphocytes Absolute: 1.6 10*3/uL (ref 0.7–3.1)
Lymphs: 38 %
MCH: 32.2 pg (ref 26.6–33.0)
MCHC: 34 g/dL (ref 31.5–35.7)
MCV: 95 fL (ref 79–97)
Monocytes Absolute: 0.4 10*3/uL (ref 0.1–0.9)
Monocytes: 9 %
Neutrophils Absolute: 2.1 10*3/uL (ref 1.4–7.0)
Neutrophils: 49 %
Platelets: 260 10*3/uL (ref 150–450)
RBC: 4.51 x10E6/uL (ref 3.77–5.28)
RDW: 12.4 % (ref 11.7–15.4)
WBC: 4.1 10*3/uL (ref 3.4–10.8)

## 2024-06-18 LAB — COMPREHENSIVE METABOLIC PANEL WITH GFR
ALT: 22 [IU]/L (ref 0–32)
AST: 14 [IU]/L (ref 0–40)
Albumin: 4.5 g/dL (ref 3.9–4.9)
Alkaline Phosphatase: 56 [IU]/L (ref 49–135)
BUN/Creatinine Ratio: 22 (ref 12–28)
BUN: 19 mg/dL (ref 8–27)
Bilirubin Total: 0.6 mg/dL (ref 0.0–1.2)
CO2: 23 mmol/L (ref 20–29)
Calcium: 9.5 mg/dL (ref 8.7–10.3)
Chloride: 104 mmol/L (ref 96–106)
Creatinine, Ser: 0.86 mg/dL (ref 0.57–1.00)
Globulin, Total: 1.6 g/dL (ref 1.5–4.5)
Glucose: 94 mg/dL (ref 70–99)
Potassium: 4.3 mmol/L (ref 3.5–5.2)
Sodium: 141 mmol/L (ref 134–144)
Total Protein: 6.1 g/dL (ref 6.0–8.5)
eGFR: 74 mL/min/{1.73_m2}

## 2024-06-18 LAB — LIPID PANEL
Chol/HDL Ratio: 3.7 ratio (ref 0.0–4.4)
Cholesterol, Total: 197 mg/dL (ref 100–199)
HDL: 53 mg/dL
LDL Chol Calc (NIH): 119 mg/dL — ABNORMAL HIGH (ref 0–99)
Triglycerides: 140 mg/dL (ref 0–149)
VLDL Cholesterol Cal: 25 mg/dL (ref 5–40)

## 2024-06-18 LAB — MAGNESIUM: Magnesium: 2.1 mg/dL (ref 1.6–2.3)

## 2024-06-18 LAB — VITAMIN B12: Vitamin B-12: 1359 pg/mL — ABNORMAL HIGH (ref 232–1245)

## 2024-06-18 LAB — VITAMIN D 25 HYDROXY (VIT D DEFICIENCY, FRACTURES): Vit D, 25-Hydroxy: 43.8 ng/mL (ref 30.0–100.0)

## 2024-06-18 LAB — HEMOGLOBIN A1C
Est. average glucose Bld gHb Est-mCnc: 105 mg/dL
Hgb A1c MFr Bld: 5.3 % (ref 4.8–5.6)

## 2024-06-18 LAB — TSH: TSH: 1.41 u[IU]/mL (ref 0.450–4.500)

## 2024-06-18 LAB — INSULIN, RANDOM: INSULIN: 16.5 u[IU]/mL (ref 2.6–24.9)

## 2024-07-15 ENCOUNTER — Ambulatory Visit (INDEPENDENT_AMBULATORY_CARE_PROVIDER_SITE_OTHER): Admitting: Nurse Practitioner

## 2024-08-06 ENCOUNTER — Ambulatory Visit: Admitting: Family Medicine

## 2025-02-23 ENCOUNTER — Ambulatory Visit
